# Patient Record
Sex: Female | Born: 1975 | Race: Black or African American | Hispanic: No | Marital: Single | Smoking: Never smoker
Health system: Southern US, Community
[De-identification: ages and names within clinical notes are randomized; demographics above are authoritative.]

## PROBLEM LIST (undated history)

## (undated) DIAGNOSIS — Z5189 Encounter for other specified aftercare: Secondary | ICD-10-CM

## (undated) DIAGNOSIS — D279 Benign neoplasm of unspecified ovary: Secondary | ICD-10-CM

## (undated) DIAGNOSIS — E785 Hyperlipidemia, unspecified: Secondary | ICD-10-CM

## (undated) DIAGNOSIS — R079 Chest pain, unspecified: Secondary | ICD-10-CM

## (undated) DIAGNOSIS — R0609 Other forms of dyspnea: Secondary | ICD-10-CM

## (undated) DIAGNOSIS — Z9119 Patient's noncompliance with other medical treatment and regimen: Secondary | ICD-10-CM

## (undated) DIAGNOSIS — Z91199 Patient's noncompliance with other medical treatment and regimen due to unspecified reason: Secondary | ICD-10-CM

## (undated) DIAGNOSIS — I10 Essential (primary) hypertension: Secondary | ICD-10-CM

## (undated) DIAGNOSIS — R0600 Dyspnea, unspecified: Secondary | ICD-10-CM

## (undated) DIAGNOSIS — E119 Type 2 diabetes mellitus without complications: Secondary | ICD-10-CM

## (undated) HISTORY — DX: Chest pain, unspecified: R07.9

## (undated) HISTORY — DX: Morbid (severe) obesity due to excess calories: E66.01

## (undated) HISTORY — PX: OTHER SURGICAL HISTORY: SHX169

## (undated) HISTORY — DX: Hyperlipidemia, unspecified: E78.5

## (undated) HISTORY — DX: Essential (primary) hypertension: I10

## (undated) HISTORY — DX: Patient's noncompliance with other medical treatment and regimen due to unspecified reason: Z91.199

## (undated) HISTORY — DX: Encounter for other specified aftercare: Z51.89

## (undated) HISTORY — DX: Dyspnea, unspecified: R06.00

## (undated) HISTORY — PX: MOUTH SURGERY: SHX715

## (undated) HISTORY — PX: LAPAROSCOPIC GASTRIC SLEEVE RESECTION: SHX5895

## (undated) HISTORY — DX: Benign neoplasm of unspecified ovary: D27.9

## (undated) HISTORY — DX: Patient's noncompliance with other medical treatment and regimen: Z91.19

## (undated) HISTORY — DX: Other forms of dyspnea: R06.09

## (undated) HISTORY — DX: Type 2 diabetes mellitus without complications: E11.9

## (undated) HISTORY — PX: ABDOMINAL HYSTERECTOMY: SHX81

---

## 2006-07-02 HISTORY — PX: OOPHORECTOMY: SHX86

## 2011-06-13 ENCOUNTER — Emergency Department: Payer: Self-pay | Admitting: Emergency Medicine

## 2012-05-15 ENCOUNTER — Other Ambulatory Visit (HOSPITAL_COMMUNITY)
Admission: RE | Admit: 2012-05-15 | Discharge: 2012-05-15 | Disposition: A | Discharge status: Home / Self Care | Payer: 59 | Source: Ambulatory Visit | Attending: Obstetrics and Gynecology | Admitting: Obstetrics and Gynecology

## 2012-05-15 DIAGNOSIS — Z01419 Encounter for gynecological examination (general) (routine) without abnormal findings: Secondary | ICD-10-CM | POA: Insufficient documentation

## 2012-05-15 DIAGNOSIS — Z1159 Encounter for screening for other viral diseases: Secondary | ICD-10-CM | POA: Insufficient documentation

## 2012-08-31 LAB — LIPID PANEL: Cholesterol: 158 mg/dL (ref 0–200)

## 2012-08-31 LAB — HEPATIC FUNCTION PANEL
ALT: 12 U/L (ref 7–35)
Alkaline Phosphatase: 50 U/L (ref 25–125)

## 2012-08-31 LAB — BASIC METABOLIC PANEL: Creatinine: 0.7 mg/dL (ref <=1.1)

## 2013-03-22 ENCOUNTER — Emergency Department: Payer: Self-pay | Admitting: Emergency Medicine

## 2013-03-22 LAB — CBC
MCHC: 32.5 g/dL (ref 32.0–36.0)
RBC: 4.12 10*6/uL (ref 3.80–5.20)
RDW: 14.9 % — ABNORMAL HIGH (ref 11.5–14.5)
WBC: 11.4 10*3/uL — ABNORMAL HIGH (ref 3.6–11.0)

## 2013-03-22 LAB — BASIC METABOLIC PANEL
Anion Gap: 7 (ref 7–16)
BUN: 13 mg/dL (ref 7–18)
Calcium, Total: 8.5 mg/dL (ref 8.5–10.1)
Chloride: 108 mmol/L — ABNORMAL HIGH (ref 98–107)
Co2: 22 mmol/L (ref 21–32)
Creatinine: 0.65 mg/dL (ref 0.60–1.30)
EGFR (African American): >60
EGFR (Non-African Amer.): >60
Glucose: 246 mg/dL — ABNORMAL HIGH (ref 65–99)
Osmolality: 282 (ref 275–301)
Potassium: 4 mmol/L (ref 3.5–5.1)
Sodium: 137 mmol/L (ref 136–145)

## 2013-03-22 LAB — PRO B NATRIURETIC PEPTIDE: B-Type Natriuretic Peptide: 81 pg/mL (ref 0–125)

## 2013-05-25 LAB — HEMOGLOBIN A1C: Hgb A1c MFr Bld: 12 % — AB (ref 4.0–6.0)

## 2013-06-10 ENCOUNTER — Encounter: Payer: Self-pay | Admitting: Internal Medicine

## 2013-06-10 DIAGNOSIS — E785 Hyperlipidemia, unspecified: Secondary | ICD-10-CM | POA: Insufficient documentation

## 2013-06-10 DIAGNOSIS — E1139 Type 2 diabetes mellitus with other diabetic ophthalmic complication: Secondary | ICD-10-CM | POA: Insufficient documentation

## 2013-06-10 DIAGNOSIS — E1165 Type 2 diabetes mellitus with hyperglycemia: Secondary | ICD-10-CM | POA: Insufficient documentation

## 2013-06-10 DIAGNOSIS — I10 Essential (primary) hypertension: Secondary | ICD-10-CM | POA: Insufficient documentation

## 2013-06-10 DIAGNOSIS — IMO0001 Reserved for inherently not codable concepts without codable children: Secondary | ICD-10-CM

## 2013-06-10 DIAGNOSIS — D279 Benign neoplasm of unspecified ovary: Secondary | ICD-10-CM | POA: Insufficient documentation

## 2013-06-17 ENCOUNTER — Ambulatory Visit: Payer: 59 | Admitting: Internal Medicine

## 2013-06-17 DIAGNOSIS — Z0289 Encounter for other administrative examinations: Secondary | ICD-10-CM

## 2013-07-07 LAB — HM DIABETES EYE EXAM

## 2013-07-15 ENCOUNTER — Encounter: Payer: Self-pay | Admitting: Internal Medicine

## 2013-07-15 ENCOUNTER — Ambulatory Visit (INDEPENDENT_AMBULATORY_CARE_PROVIDER_SITE_OTHER): Payer: 59 | Admitting: Internal Medicine

## 2013-07-15 VITALS — BP 118/78 | Temp 98.6°F | Resp 12 | Ht 66.0 in | Wt 294.0 lb

## 2013-07-15 DIAGNOSIS — IMO0001 Reserved for inherently not codable concepts without codable children: Secondary | ICD-10-CM

## 2013-07-15 MED ORDER — GLUCOSE BLOOD VI STRP: ORAL_STRIP | Status: DC

## 2013-07-15 MED ORDER — ACCU-CHEK SOFT TOUCH LANCETS MISC: Status: DC

## 2013-07-15 MED ORDER — CANAGLIFLOZIN 300 MG PO TABS: 300.0000 mg | ORAL_TABLET | Freq: Every day | ORAL | Status: DC

## 2013-07-15 NOTE — Progress Notes (Signed)
Patient ID: Brenda Acosta, female   DOB: 1976/07/25, 37 y.o.   MRN: [id number] HPI: Brenda Acosta is a 37 y.o.-year-old female, referred by her PCP, Dr. Ihor Dow, for management of DM2, non-insulin-dependent, uncontrolled, without complications.  Patient has been diagnosed with diabetes in [address]; she has not been on insulin before. Last hemoglobin A1c was: Lab Results  Component Value Date   HGBA1C 12.0* 05/25/2013   previously, hemoglobin A1c was 10.5 on 09/01/2012.  Pt is on a regimen of: - Metformin 1000 mg po bid - Glipizide 10 mg bid - could not afford Januvia 100 mg qd She does not want to take insulin, per discussion with PCP on 05/25/2013 and this was confirmed today. Her mother was dx with DM1 in her 30s, and she had to give her the injections. Mother had multiple problems with them >> cellulitis, other infections.   Pt does not check her sugars as she does not like to stick herself - types on computer 12h a day and cannot have sore fingertips. - average 200-250. No lows. Lowest sugar was 125 (fasting); ?  hypoglycemia awareness. Highest sugar was 375 6-7 mo ag.   Pt's meals are: - Breakfast: [place] bacon + eggs + cheese, occas. replacement meal (shake) - Lunch: replacement meal (shake) - Dinner: lean Cuisine, fish or chicken; veggies - Snacks: nuts and fruit Lost 30-40 lbs in last 3 years. Started [place] meal replacement plan - 2 shakes and a meal to lose weight. Tries to not eat starch. Walks her dog in am, no formal exercise.  - no CKD, last BUN/creatinine was 14/0.66, with a GFR of 122:  Lab Results  Component Value Date   BUN 14 08/31/2012   CREATININE 0.7 08/31/2012  She is on Lisinopril 20. Last ACR was undetectable on 09/01/2012.  - last set of lipids were: Lab Results  Component Value Date   CHOL 158 08/31/2012   HDL 31* 08/31/2012   LDLCALC 97 08/31/2012   TRIG 235* 08/31/2012  - last eye exam was in 07/08/2012. Mild DR >> goes back in 6 mo. - no numbness and  tingling in her feet. Last foot exam normal on 05/25/2013. She had Pneumovax administered on 08/31/2012. She declines flu vaccine then.  I reviewed records from her PCP and she also has a history of HTN, HL - on Pravastatin, obesity - was considering GBP, PCOS.  Pt has FH of DM in sister, mother, MGF.  ROS: Constitutional: fatigue, increased/decreased appetite, weight gain/loss, feeling hot, poor sleep Eyes: no blurry vision, no xerophthalmia ENT: no sore throat, no nodules palpated in throat, no dysphagia/odynophagia, no hoarseness Cardiovascular: no CP/SOB/palpitations/+ chronic significant leg swelling Respiratory: no cough/SOB Gastrointestinal: + N/no V/+ D/no C Musculoskeletal: no muscle/joint aches Skin: no rashes; + easy bruising, excessive hair growth Neurological: no tremors/numbness/tingling/dizziness, + headaches Psychiatric: no depression/anxiety Low libido  Past Medical History  Diagnosis Date  . Diabetes mellitus without complication   . Hypertension   . Hyperlipidemia   . Mucinous cystadenoma of ovary     Hx of    Past Surgical History  Procedure Laterality Date  . Oophorectomy  07/2006    Rt; large benign ovarian tumor  . Mouth surgery    . Abdominal hysterectomy      partial hyst   History   Social History  . Marital Status: Unknown    Spouse Name: N/A    Number of Children: 61 and 35 y/o   Occupational History  . Director of Donor Relations  [employer]   Social History Main Topics  . Smoking status: Never Smoker   . Smokeless tobacco: Not on file  . Alcohol Use: No  . Drug Use: No  . 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 aspirin 81 MG chewable tablet Chew 81 mg by mouth daily.      Marland Kitchen glipiZIDE (GLUCOTROL) 10 MG tablet Take 10 mg by mouth 2 (two) times daily before a meal.      . ibuprofen (ADVIL,MOTRIN) 600 MG tablet Take 600 mg by mouth every 6 (six) hours as needed for pain.      Marland Kitchen lisinopril-hydrochlorothiazide (PRINZIDE,ZESTORETIC) 20-25 MG per tablet Take 1 tablet by mouth daily.      . metFORMIN (GLUCOPHAGE) 1000 MG tablet Take 1,000 mg by mouth 2 (two) times daily with a meal.      . mometasone (NASONEX) 50 MCG/ACT nasal spray Place 2 sprays into the nose daily.      . pravastatin (PRAVACHOL) 40 MG tablet Take 40 mg by mouth daily.      . sitaGLIPtin (JANUVIA) 100 MG tablet Take 100 mg by mouth daily.      . tranexamic acid (LYSTEDA) 650 MG TABS Take 1,300 mg by mouth 3 (three) times daily. Up to 5 days with menses.       No current facility-administered medications on file prior to visit.   Allergies  Allergen Reactions  . Benzoyl Peroxide Other (See Comments)    Burns     Family History  Problem Relation Age of Onset  . Heart disease Mother 81    heart attack  . Hypertension Father   . Diabetes Father     Type II  . Cancer Maternal Grandfather     prostate cancer   PE: BP 118/78  Temp(Src) 98.6 F (37 C) (Oral)  Resp 12  Ht 5\' 6"  (1.676 m)  Wt 294 lb (133.358 kg)  BMI 47.48 kg/m2  LMP 07/01/2013 Wt Readings from Last 3 Encounters:  07/15/13 294 lb (133.358 kg)   Constitutional: obese, in NAD Eyes: PERRLA, EOMI, no exophthalmos ENT: moist mucous membranes, no thyromegaly, no cervical lymphadenopathy Cardiovascular: RRR, No MRG, + nonpitting bilateral LE edema Respiratory: CTA B Gastrointestinal: abdomen soft, NT, ND, BS+ Musculoskeletal: no deformities, strength intact in all 4 Skin: moist, warm, acanthosis nigricans Neurological: no tremor with outstretched hands, DTR normal in all 4  ASSESSMENT: 1. DM2, non-insulin-dependent,  uncontrolled, without complications  PLAN:  1. Patient with long-standing, recently more uncontrolled diabetes, on oral antidiabetic regimen, which became insufficient - We discussed about options for treatment, and I suggested to:  Start Invokana at 100 mg daily x 1 week, then increase to 300 mg daily. - given discount card for Invokana - we discussed about SEs of Invokana, which are: dizziness (advised to be careful when stands from sitting position), decreased BP - usually not < normal (BP today is not low), and fungal UTIs (advised to let me know if develops one).  - we discussed at length about diet and I suggested changes (also see pt instructions). She refuses a  nutrition referral, had this in the past. - Strongly advised her to start checking her sugars at different times of the day - check 1-2 times a day, rotating checks - given sugar log and advised how to fill it and to bring it at next appt  - given foot care handout and explained the principles  - given instructions for hypoglycemia management "15-15 rule"  - has yearly eye exams - Return to clinic in 1.5 month with sugar log

## 2013-07-15 NOTE — Patient Instructions (Signed)
Start Invokana 100 mg daily in am for a week, then increase to 300 mg daily. Keep on Metformin and Glipizide. Return in 1.5 months with your sugar log.  Please consider the following ways to cut down carbs and fat and increase fiber and micronutrients in your diet:  - substitute whole grain for white bread or pasta - substitute brown rice for white rice - substitute 90-calorie flat bread pieces for slices of bread when possible - substitute sweet potatoes or yams for white potatoes - substitute humus for margarine - substitute tofu for cheese when possible - substitute almond or rice milk for regular milk (would not drink soy milk daily due to concern for soy estrogen influence on breast cancer risk) - substitute dark chocolate for other sweets when possible - substitute water - can add lemon or orange slices for taste - for diet sodas (artificial sweeteners will trick your body that you can eat sweets without getting calories and will lead you to overeating and weight gain in the long run) - do not skip breakfast or other meals (this will slow down the metabolism and will result in more weight gain over time)  - can try smoothies made from fruit and almond/rice milk in am instead of regular breakfast - can also try old-fashioned (not instant) oatmeal made with almond/rice milk in am - order the dressing on the side when eating salad at a restaurant (pour less than half of the dressing on the salad) - eat as little meat as possible - can try juicing, but should not forget that juicing will get rid of the fiber, so would alternate with eating raw veg./fruits or drinking smoothies - use as little oil as possible, even when using olive oil - can dress a salad with a mix of balsamic vinegar and lemon juice, for e.g. - use agave nectar, stevia sugar, or regular sugar rather than artificial sweateners - steam or broil/roast veggies  - snack on veggies/fruit/nuts (unsalted, preferably) when  possible, rather than processed foods - reduce or eliminate aspartame in diet (it is in diet sodas, chewing gum, etc) Read the labels!  Try to read Dr. Katherina Right book: "Program for Reversing Diabetes" for the vegan concept and other ideas for healthy eating.

## 2013-07-18 ENCOUNTER — Other Ambulatory Visit (INDEPENDENT_AMBULATORY_CARE_PROVIDER_SITE_OTHER): Payer: Self-pay

## 2013-07-20 ENCOUNTER — Other Ambulatory Visit: Payer: Self-pay | Admitting: *Deleted

## 2013-07-20 MED ORDER — GLUCOSE BLOOD VI STRP: ORAL_STRIP | Status: DC

## 2013-08-24 ENCOUNTER — Ambulatory Visit: Payer: 59 | Admitting: Internal Medicine

## 2013-08-26 ENCOUNTER — Ambulatory Visit: Payer: 59 | Admitting: Internal Medicine

## 2013-10-07 ENCOUNTER — Other Ambulatory Visit: Payer: Self-pay

## 2013-10-26 ENCOUNTER — Other Ambulatory Visit: Payer: Self-pay | Admitting: Internal Medicine

## 2013-11-22 ENCOUNTER — Other Ambulatory Visit: Payer: Self-pay | Admitting: Internal Medicine

## 2013-12-28 ENCOUNTER — Other Ambulatory Visit: Payer: Self-pay | Admitting: Internal Medicine

## 2014-02-03 ENCOUNTER — Other Ambulatory Visit: Payer: Self-pay | Admitting: Internal Medicine

## 2014-03-05 ENCOUNTER — Other Ambulatory Visit: Payer: Self-pay | Admitting: Internal Medicine

## 2014-04-04 ENCOUNTER — Other Ambulatory Visit: Payer: Self-pay | Admitting: Internal Medicine

## 2014-05-06 ENCOUNTER — Other Ambulatory Visit: Payer: Self-pay | Admitting: Internal Medicine

## 2014-05-07 ENCOUNTER — Other Ambulatory Visit: Payer: Self-pay | Admitting: Internal Medicine

## 2014-05-08 NOTE — Telephone Encounter (Signed)
Needs appt for further refills.

## 2014-06-13 ENCOUNTER — Encounter: Payer: Self-pay | Admitting: Internal Medicine

## 2014-06-13 ENCOUNTER — Other Ambulatory Visit: Payer: Self-pay | Admitting: Internal Medicine

## 2014-06-14 ENCOUNTER — Other Ambulatory Visit: Payer: Self-pay | Admitting: *Deleted

## 2014-06-14 MED ORDER — CANAGLIFLOZIN 300 MG PO TABS: 1.0000 | ORAL_TABLET | Freq: Every day | ORAL | Status: DC

## 2014-06-29 ENCOUNTER — Telehealth: Payer: Self-pay

## 2014-06-29 NOTE — Telephone Encounter (Signed)
Diabetic Bundle. Pt advised to call back and schedule appointment with Dr. Cruzita Lederer.

## 2014-07-11 ENCOUNTER — Ambulatory Visit (INDEPENDENT_AMBULATORY_CARE_PROVIDER_SITE_OTHER): Payer: 59 | Admitting: Internal Medicine

## 2014-07-11 ENCOUNTER — Encounter: Payer: Self-pay | Admitting: Internal Medicine

## 2014-07-11 VITALS — BP 120/84 | HR 89 | Temp 98.3°F | Resp 12 | Wt 277.0 lb

## 2014-07-11 DIAGNOSIS — E1165 Type 2 diabetes mellitus with hyperglycemia: Principal | ICD-10-CM

## 2014-07-11 DIAGNOSIS — IMO0001 Reserved for inherently not codable concepts without codable children: Secondary | ICD-10-CM

## 2014-07-11 LAB — BASIC METABOLIC PANEL
BUN: 20 mg/dL (ref 6–23)
CALCIUM: 9.8 mg/dL (ref 8.4–10.5)
CO2: 22 meq/L (ref 19–32)
Chloride: 101 mEq/L (ref 96–112)
Creatinine, Ser: 0.8 mg/dL (ref 0.4–1.2)
GFR: 84.03 mL/min (ref >60.00)
Glucose, Bld: 135 mg/dL — ABNORMAL HIGH (ref 70–99)
POTASSIUM: 3.8 meq/L (ref 3.5–5.1)
SODIUM: 133 meq/L — AB (ref 135–145)

## 2014-07-11 LAB — HEMOGLOBIN A1C: Hgb A1c MFr Bld: 11.2 % — ABNORMAL HIGH (ref 4.6–6.5)

## 2014-07-11 MED ORDER — CANAGLIFLOZIN 300 MG PO TABS: 1.0000 | ORAL_TABLET | Freq: Every day | ORAL | Status: DC

## 2014-07-11 NOTE — Patient Instructions (Signed)
Continue Invokana 300 mg daily in am  Continue Metformin 1000 mg 2x a day  Continue Glipizide 10 mg 2x a day  Please stop at the lab.  Try to check sugars 1-2x every day and ring log at next visit.

## 2014-07-11 NOTE — Progress Notes (Signed)
Patient ID: Brenda Acosta, female   DOB: [birth year], 38 y.o.   MRN: [id number] HPI: Brenda Acosta is a 38 y.o.-year-old female, returning for f/u for DM2, dx 2012, non-insulin-dependent, uncontrolled, with complications (mild DR). Last visit was 1 year ago!  Last hemoglobin A1c was: Lab Results  Component Value Date   HGBA1C 12.0* 05/25/2013   previously, hemoglobin A1c was 10.5 on 09/01/2012.  Pt is on a regimen of: - Metformin 1000 mg po bid - Glipizide 10 mg bid - Invokana 300 mg daily She could not afford Januvia 100 mg qd She does not want to take insulin, per discussion with PCP on 05/25/2013 and this was confirmed today. Her mother was dx with DM1 in her 45s, and she had to give her the injections. Mother had multiple problems with them >> cellulitis, other infections.   Pt does not check her sugars as she does not like to stick herself - types on computer 12h a day and cannot have sore fingertips. - average 200-250 >> n/c ? lows. ?  hypoglycemia awareness.  Pt's meals are: - Breakfast: [place] bacon + eggs + cheese, occas. replacement meal (shake) - Lunch: replacement meal (shake) - Dinner: lean Cuisine, fish or chicken; veggies - Snacks: nuts and fruit Lost 30-40 lbs in last 4 years and 20 lbs since last visit.   - no CKD, last BUN/creatinine was 14/0.66, with a GFR of 122. Lab Results  Component Value Date   BUN 14 08/31/2012   CREATININE 0.7 08/31/2012  She is on Lisinopril 20. Last ACR was undetectable on 09/01/2012.  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 - no numbness and tingling in her feet. Foot exam normal on 11/2013. She had Pneumovax administered on 08/31/2012.  She also has a history of HTN, HL - on Pravastatin, obesity - was considering GBP,  PCOS.  I reviewed pt's medications, allergies, PMH, social hx, family hx and no changes required, except as mentioned above.  ROS: Constitutional: no fatigue, no increased/decreased appetite, + weight loss Eyes: no blurry vision, no xerophthalmia ENT: no sore throat, no nodules palpated in throat, no dysphagia/odynophagia, no hoarseness Cardiovascular: no CP/SOB/palpitations/+ significant leg swelling Respiratory: no cough/SOB Gastrointestinal: no N/no V/no D/no C Musculoskeletal: no muscle/+ joint aches (rotator cuff tendonitis) Skin: no rashes; no easy bruising Neurological: no tremors/numbness/tingling/dizziness, + headaches  PE: BP 120/84  Pulse 89  Temp(Src) 98.3 F (36.8 C) (Oral)  Resp 12  Wt 277 lb (125.646 kg)  SpO2 98% Wt Readings from Last 3 Encounters:  07/11/14 277 lb (125.646 kg)  07/15/13 294 lb (133.358 kg)   Constitutional: obese, in NAD Eyes: PERRLA, EOMI, no exophthalmos ENT: moist mucous membranes, no thyromegaly, no cervical lymphadenopathy Cardiovascular: RRR, No MRG, + nonpitting bilateral LE edema Respiratory: CTA B Gastrointestinal: abdomen soft, NT, ND, BS+ Musculoskeletal: no deformities, strength intact in all 4 Skin: moist, warm, acanthosis nigricans Neurological: no tremor with outstretched hands, DTR normal in all 4  ASSESSMENT: 1. 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 Patient with long-standing, uncontrolled diabetes, on oral antidiabetic regimen, with unknown control (not checking sugars, no HbA1c in last year, not coming to the appts as advised...). - I suggested to Continue Invokana 300  mg daily in am Continue Metformin 1000 mg 2x a day Continue Glipizide 10 mg 2x a day - She is doing great on her diet but I wonder if hyperglycemia may play a role in her weight loss...  - start checking her sugars at different times of the day - check 1-2 times a day, rotating checks - given sugar logs -  has yearly eye exams - refilled Invokana  - check HbA1c - got records from PCP - Return to clinic in 3 months with sugar log   Office Visit on 07/11/2014  Component Date Value Ref Range Status  . HM Diabetic Eye Exam 07/07/2013 Retinopathy* No Retinopathy Final   mild nonproliferative DR  . Hemoglobin A1C 07/11/2014 11.2* 4.6 - 6.5 % Final   Glycemic Control Guidelines for People with Diabetes:Non Diabetic:  <6%Goal of Therapy: <7%Additional Action Suggested:  >8%   . Sodium 07/11/2014 133* 135 - 145 mEq/L Final  . Potassium 07/11/2014 3.8  3.5 - 5.1 mEq/L Final  . Chloride 07/11/2014 101  96 - 112 mEq/L Final  . CO2 07/11/2014 22  19 - 32 mEq/L Final  . Glucose, Bld 07/11/2014 135* 70 - 99 mg/dL Final  . BUN 07/11/2014 20  6 - 23 mg/dL Final  . Creatinine, Ser 07/11/2014 0.8  0.4 - 1.2 mg/dL Final  . Calcium 07/11/2014 9.8  8.4 - 10.5 mg/dL Final  . GFR 07/11/2014 84.03  >60.00 mL/min Final   DM out of control. She would not check sugars... Will need basal insulin as she is glucotoxic. Will ask her to come back in 1 mo and in the meantime start checking sugars 2x a day.

## 2014-10-11 ENCOUNTER — Ambulatory Visit: Payer: 59 | Admitting: Internal Medicine

## 2015-01-11 ENCOUNTER — Other Ambulatory Visit: Payer: Self-pay | Admitting: Internal Medicine

## 2015-02-03 ENCOUNTER — Emergency Department: Payer: Self-pay | Admitting: Emergency Medicine

## 2015-02-09 ENCOUNTER — Ambulatory Visit
Admission: RE | Admit: 2015-02-09 | Discharge: 2015-02-09 | Disposition: A | Discharge status: Home / Self Care | Payer: 59 | Source: Ambulatory Visit | Attending: Family Medicine | Admitting: Family Medicine

## 2015-02-09 ENCOUNTER — Other Ambulatory Visit: Payer: Self-pay | Admitting: Family Medicine

## 2015-03-13 ENCOUNTER — Other Ambulatory Visit: Payer: Self-pay | Admitting: Obstetrics and Gynecology

## 2015-03-13 ENCOUNTER — Other Ambulatory Visit (HOSPITAL_COMMUNITY)
Admission: RE | Admit: 2015-03-13 | Discharge: 2015-03-13 | Disposition: A | Discharge status: Home / Self Care | Payer: 59 | Source: Ambulatory Visit | Attending: Obstetrics and Gynecology | Admitting: Obstetrics and Gynecology

## 2015-03-13 DIAGNOSIS — Z1151 Encounter for screening for human papillomavirus (HPV): Secondary | ICD-10-CM | POA: Diagnosis present

## 2015-03-13 DIAGNOSIS — Z01419 Encounter for gynecological examination (general) (routine) without abnormal findings: Secondary | ICD-10-CM | POA: Insufficient documentation

## 2015-03-15 LAB — CYTOLOGY - PAP

## 2015-03-24 ENCOUNTER — Other Ambulatory Visit: Payer: Self-pay | Admitting: Obstetrics and Gynecology

## 2015-04-08 ENCOUNTER — Other Ambulatory Visit: Payer: Self-pay | Admitting: Internal Medicine

## 2015-04-09 NOTE — Telephone Encounter (Signed)
PT NEEDS APPT FOR FURTHER REFILLS 

## 2015-06-09 ENCOUNTER — Other Ambulatory Visit: Payer: Self-pay | Admitting: *Deleted

## 2015-06-09 MED ORDER — CANAGLIFLOZIN 300 MG PO TABS: 300.0000 mg | ORAL_TABLET | Freq: Every day | ORAL | Status: DC

## 2015-07-15 ENCOUNTER — Other Ambulatory Visit: Payer: Self-pay | Admitting: Internal Medicine

## 2015-08-01 ENCOUNTER — Emergency Department: Payer: 59

## 2015-08-01 ENCOUNTER — Encounter: Payer: Self-pay | Admitting: Emergency Medicine

## 2015-08-01 ENCOUNTER — Other Ambulatory Visit: Payer: Self-pay

## 2015-08-01 ENCOUNTER — Emergency Department
Admission: EM | Admit: 2015-08-01 | Discharge: 2015-08-02 | Disposition: A | Discharge status: Home / Self Care | Payer: 59 | Attending: Emergency Medicine | Admitting: Emergency Medicine

## 2015-08-01 DIAGNOSIS — Z7982 Long term (current) use of aspirin: Secondary | ICD-10-CM | POA: Diagnosis not present

## 2015-08-01 DIAGNOSIS — R600 Localized edema: Secondary | ICD-10-CM | POA: Insufficient documentation

## 2015-08-01 DIAGNOSIS — I10 Essential (primary) hypertension: Secondary | ICD-10-CM | POA: Diagnosis not present

## 2015-08-01 DIAGNOSIS — R0602 Shortness of breath: Secondary | ICD-10-CM

## 2015-08-01 DIAGNOSIS — R609 Edema, unspecified: Secondary | ICD-10-CM

## 2015-08-01 DIAGNOSIS — E119 Type 2 diabetes mellitus without complications: Secondary | ICD-10-CM | POA: Diagnosis not present

## 2015-08-01 DIAGNOSIS — Z79899 Other long term (current) drug therapy: Secondary | ICD-10-CM | POA: Insufficient documentation

## 2015-08-01 LAB — CBC WITH DIFFERENTIAL/PLATELET
Basophils Absolute: 0.1 10*3/uL (ref 0–0.1)
Basophils Relative: 1 %
EOS PCT: 3 %
Eosinophils Absolute: 0.4 10*3/uL (ref 0–0.7)
HEMATOCRIT: 32.6 % — AB (ref 35.0–47.0)
Hemoglobin: 11.1 g/dL — ABNORMAL LOW (ref 12.0–16.0)
LYMPHS PCT: 35 %
Lymphs Abs: 4.3 10*3/uL — ABNORMAL HIGH (ref 1.0–3.6)
MCH: 28 pg (ref 26.0–34.0)
MCHC: 34 g/dL (ref 32.0–36.0)
MCV: 82.1 fL (ref 80.0–100.0)
MONO ABS: 0.5 10*3/uL (ref 0.2–0.9)
MONOS PCT: 5 %
Neutro Abs: 6.9 10*3/uL — ABNORMAL HIGH (ref 1.4–6.5)
Neutrophils Relative %: 56 %
PLATELETS: 451 10*3/uL — AB (ref 150–440)
RBC: 3.97 MIL/uL (ref 3.80–5.20)
RDW: 13.3 % (ref 11.5–14.5)
WBC: 12.3 10*3/uL — ABNORMAL HIGH (ref 3.6–11.0)

## 2015-08-01 LAB — COMPREHENSIVE METABOLIC PANEL
ALBUMIN: 3.3 g/dL — AB (ref 3.5–5.0)
ALT: 14 U/L (ref 14–54)
ANION GAP: 8 (ref 5–15)
AST: 32 U/L (ref 15–41)
Alkaline Phosphatase: 40 U/L (ref 38–126)
BUN: 14 mg/dL (ref 6–20)
CO2: 25 mmol/L (ref 22–32)
Calcium: 8.9 mg/dL (ref 8.9–10.3)
Chloride: 105 mmol/L (ref 101–111)
Creatinine, Ser: 0.83 mg/dL (ref 0.44–1.00)
GFR calc Af Amer: >60 mL/min (ref >60)
GFR calc non Af Amer: >60 mL/min (ref >60)
GLUCOSE: 181 mg/dL — AB (ref 65–99)
POTASSIUM: 3.6 mmol/L (ref 3.5–5.1)
SODIUM: 138 mmol/L (ref 135–145)
Total Bilirubin: 0.3 mg/dL (ref 0.3–1.2)
Total Protein: 7 g/dL (ref 6.5–8.1)

## 2015-08-01 LAB — BRAIN NATRIURETIC PEPTIDE: B Natriuretic Peptide: 32 pg/mL (ref 0.0–100.0)

## 2015-08-01 LAB — TROPONIN I: Troponin I: <0.03 ng/mL (ref <0.031)

## 2015-08-01 MED ORDER — IOHEXOL 350 MG/ML SOLN
100.0000 mL | Freq: Once | INTRAVENOUS | Status: AC | PRN
  Administered 2015-08-01: 100 mL via INTRAVENOUS

## 2015-08-01 NOTE — ED Notes (Signed)
Pt resting quietly.  Pt alert.  No acute distress.

## 2015-08-01 NOTE — ED Notes (Signed)
Pt alert.  Resting quietly in room   Family with pt.  nsr on monitor.

## 2015-08-01 NOTE — ED Notes (Signed)
Reports going to MD office yesterday for legs swelling and mild sob.  States he MD told her to come to ER bc her d-dimer was elevated.  Skin w/d, no resp distress at this time.

## 2015-08-01 NOTE — ED Notes (Signed)
Resumed care from [facility] rn.  Pt alert.  nsr on monitor.  Skin warm and dry.  Iv in place.  Family with pt.

## 2015-08-01 NOTE — ED Provider Notes (Signed)
[facility] Emergency Department Provider Note  ____________________________________________  Time seen: Approximately 671 PM  I have reviewed the triage vital signs and the nursing notes.   HISTORY  Chief Complaint Shortness of Breath    HPI Brenda Acosta is a 39 y.o. female with a history of diabetes and hypertension who is presenting today with shortness of breath as well as bilateral lower extremity swelling over the past 2 weeks. The patient says that she has had 2 recent plane flights as well as started a estrogen containing birth control pills 2 months ago. She denies any history of blood clots in her family. She says that the swelling her legs has been going on for about 2 weeks with the left leg being greater than the right leg. She denies any smoking drinking or drug use. Says that she does have a history of congestive heart failure and her family. Denies any chest pain. Says that she has become shortness of breath as well over the past 2 weeks with exertion.Was seen her primary care doctor's office today who did a d-dimer which was found to be elevated. Was sent to the emergency department for further evaluation and treatment.   Past Medical History  Diagnosis Date  . Diabetes mellitus without complication   . Hypertension   . Hyperlipidemia   . Mucinous cystadenoma of ovary     Hx of     Patient Active Problem List   Diagnosis Date Noted  . Type II or unspecified type diabetes mellitus with ophthalmic manifestations, uncontrolled 06/10/2013  . Unspecified essential hypertension 06/10/2013  . Other and unspecified hyperlipidemia 06/10/2013  . Mucinous cystadenoma of ovary 06/10/2013    Past Surgical History  Procedure Laterality Date  . Oophorectomy  07/2006    Rt; large benign ovarian tumor  . Mouth surgery    . Abdominal hysterectomy      partial hyst    Current Outpatient Rx  Name  Route  Sig  Dispense  Refill  . aspirin 81 MG  chewable tablet   Oral   Chew 81 mg by mouth daily.         . canagliflozin (INVOKANA) 300 MG TABS tablet   Oral   Take 300 mg by mouth daily.   30 tablet   0     PT NEEDS APPT FOR FURTHER REFILLS.   Marland Kitchen glipiZIDE (GLUCOTROL) 10 MG tablet   Oral   Take 10 mg by mouth 2 (two) times daily before a meal.         . glucose blood test strip      Use as instructed - 2x a day   100 each   10     ACCU-CHEK AVIVA PLUS TEST STRIPS   . ibuprofen (ADVIL,MOTRIN) 600 MG tablet   Oral   Take 600 mg by mouth every 6 (six) hours as needed for pain.         . Lancets (ACCU-CHEK SOFT TOUCH) lancets      Use as instructed - 2x a day   100 each   12   . lisinopril-hydrochlorothiazide (PRINZIDE,ZESTORETIC) 20-25 MG per tablet   Oral   Take 1 tablet by mouth daily.         . metFORMIN (GLUCOPHAGE) 1000 MG tablet   Oral   Take 1,000 mg by mouth 2 (two) times daily with a meal.         . mometasone (NASONEX) 50 MCG/ACT nasal spray   Nasal  Place 2 sprays into the nose daily.         . pravastatin (PRAVACHOL) 40 MG tablet   Oral   Take 40 mg by mouth daily.         . sitaGLIPtin (JANUVIA) 100 MG tablet   Oral   Take 100 mg by mouth daily.         . tranexamic acid (LYSTEDA) 650 MG TABS   Oral   Take 1,300 mg by mouth 3 (three) times daily. Up to 5 days with menses.           Allergies Benzoyl peroxide  Family History  Problem Relation Age of Onset  . Heart disease Mother 89    heart attack  . Hypertension Father   . Diabetes Father     Type II  . Cancer Maternal Grandfather     prostate cancer    Social History Social History  Substance Use Topics  . Smoking status: Never Smoker   . Smokeless tobacco: None  . Alcohol Use: No    Review of Systems Constitutional: No fever/chills Eyes: No visual changes. ENT: No sore throat. Cardiovascular: Denies chest pain. Respiratory: As above  Gastrointestinal: No abdominal pain.  No nausea, no  vomiting.  No diarrhea.  No constipation. Genitourinary: Negative for dysuria. Musculoskeletal: Negative for back pain. Skin: Negative for rash. Neurological: Negative for headaches, focal weakness or numbness.  10-point ROS otherwise negative.  ____________________________________________   PHYSICAL EXAM:  VITAL SIGNS: ED Triage Vitals  Enc Vitals Group     BP 08/01/15 1753 116/74 mmHg     Pulse Rate 08/01/15 1753 87     Resp 08/01/15 1753 22     Temp 08/01/15 1753 99 F (37.2 C)     Temp Source 08/01/15 1753 Oral     SpO2 08/01/15 1753 99 %     Weight 08/01/15 1753 282 lb (127.914 kg)     Height 08/01/15 1753 5\' 6"  (1.676 m)     Head Cir --      Peak Flow --      Pain Score 08/01/15 1754 2     Pain Loc --      Pain Edu? --      Excl. in [place]? --     Constitutional: Alert and oriented. Well appearing and in no acute distress. Eyes: Conjunctivae are normal. PERRL. EOMI. Head: Atraumatic. Nose: No congestion/rhinnorhea. Mouth/Throat: Mucous membranes are moist.  Oropharynx non-erythematous. Neck: No stridor.   Cardiovascular: Normal rate, regular rhythm. Grossly normal heart sounds.  Good peripheral circulation. Respiratory: Normal respiratory effort.  No retractions. Lungs CTAB. Gastrointestinal: Soft and nontender. No distention. No abdominal bruits. No CVA tenderness. Musculoskeletal: Moderate edema to the bilateral lower extremities to the ankles and calves with the left lower extremity being greater than the right. There is an intact dorsalis pedis pulse that is equal to both feet.  No joint effusions. Neurologic:  Normal speech and language. No gross focal neurologic deficits are appreciated. No gait instability. Skin:  Skin is warm, dry and intact. No rash noted. Psychiatric: Mood and affect are normal. Speech and behavior are normal.  ____________________________________________   LABS (all labs ordered are listed, but only abnormal results are  displayed)  Labs Reviewed  CBC WITH DIFFERENTIAL/PLATELET - Abnormal; Notable for the following:    WBC 12.3 (*)    Hemoglobin 11.1 (*)    HCT 32.6 (*)    Platelets 451 (*)    Neutro Abs 6.9 (*)  Lymphs Abs 4.3 (*)    All other components within normal limits  COMPREHENSIVE METABOLIC PANEL - Abnormal; Notable for the following:    Glucose, Bld 181 (*)    Albumin 3.3 (*)    All other components within normal limits  BRAIN NATRIURETIC PEPTIDE  TROPONIN I   ____________________________________________  EKG  ED ECG REPORT I, Doran Stabler, the attending physician, personally viewed and interpreted this ECG.   Date: 08/01/2015  EKG Time: 1854  Rate: 80  Rhythm: normal EKG, normal sinus rhythm  Axis: Normal axis  Intervals:none  ST&T Change: No ST segment elevation or depression. No abnormal T-wave inversion.  ____________________________________________  RADIOLOGY  CT angiography negative for pulmonary embolus and without any significant abnormality. ____________________________________________   PROCEDURES    ____________________________________________   INITIAL IMPRESSION / ASSESSMENT AND PLAN / ED COURSE  Pertinent labs & imaging results that were available during my care of the patient were reviewed by me and considered in my medical decision making (see chart for details).  ----------------------------------------- 11:15 PM on 08/01/2015 -----------------------------------------  Patient resting comfortably at this time and is aware of the results of her labs as well as the CAT scan of the chest. We will proceed with bilateral lower summary ultrasound to rule out DVT. Signed out to Dr. Owens Shark. Patient knows that if the ultrasound of the bilateral lower extremities is found to not have a DVT then she will follow-up in the office with cardiology. Unclear etiology of symptoms. Reassuring labs as well as EKG and  CTA. ____________________________________________   FINAL CLINICAL IMPRESSION(S) / ED DIAGNOSES  Acute shortness of breath. Acute bilateral lower extremity edema. Initial visit.    Orbie Pyo, MD 08/01/15 [phone]

## 2015-08-02 ENCOUNTER — Telehealth: Payer: Self-pay

## 2015-08-02 NOTE — ED Provider Notes (Signed)
I assumed care of the patient 11:30 PM from Dr. Dineen Kid. Patient's bilateral lower extremity Dopplers revealed:EXAM: BILATERAL LOWER EXTREMITY VENOUS DOPPLER ULTRASOUND  TECHNIQUE: Gray-scale sonography with graded compression, as well as color Doppler and duplex ultrasound were performed to evaluate the lower extremity deep venous systems from the level of the common femoral vein and including the common femoral, femoral, profunda femoral, popliteal and calf veins including the posterior tibial, peroneal and gastrocnemius veins when visible. The superficial great saphenous vein was also interrogated. Spectral Doppler was utilized to evaluate flow at rest and with distal augmentation maneuvers in the common femoral, femoral and popliteal veins.  COMPARISON: None.  FINDINGS: RIGHT LOWER EXTREMITY  Common Femoral Vein: No evidence of thrombus. Normal compressibility, respiratory phasicity and response to augmentation.  Saphenofemoral Junction: No evidence of thrombus. Normal compressibility and flow on color Doppler imaging.  Profunda Femoral Vein: No evidence of thrombus. Normal compressibility and flow on color Doppler imaging.  Femoral Vein: No evidence of thrombus. Normal compressibility, respiratory phasicity and response to augmentation.  Popliteal Vein: No evidence of thrombus. Normal compressibility, respiratory phasicity and response to augmentation.  Calf Veins: No evidence of thrombus. Normal compressibility and flow on color Doppler imaging.  Superficial Great Saphenous Vein: No evidence of thrombus. Normal compressibility and flow on color Doppler imaging.  Venous Reflux: None.  Other Findings: None.  LEFT LOWER EXTREMITY  Common Femoral Vein: No evidence of thrombus. Normal compressibility, respiratory phasicity and response to augmentation.  Saphenofemoral Junction: No evidence of thrombus. Normal compressibility and flow on color Doppler  imaging.  Profunda Femoral Vein: No evidence of thrombus. Normal compressibility and flow on color Doppler imaging.  Femoral Vein: No evidence of thrombus. Normal compressibility, respiratory phasicity and response to augmentation.  Popliteal Vein: No evidence of thrombus. Normal compressibility, respiratory phasicity and response to augmentation.  Calf Veins: No evidence of thrombus. Normal compressibility and flow on color Doppler imaging. Left peroneal vein not well visualized due to body habitus.  Superficial Great Saphenous Vein: No evidence of thrombus. Normal compressibility and flow on color Doppler imaging.  Venous Reflux: None.  Other Findings: None.  IMPRESSION: No evidence of deep venous thrombosis.  I spoke with the patient at length regarding need for follow-up I also informed the patient of the possibility of visualizing DVTs below the knee and the percentage thereof. In addition I spoke with cardiologist on call who reviewed the patient's CT scan lab data and history and recommended outpatient follow-up. Patient agreed with discharge plan.  Gregor Hams, MD 08/02/15 [phone]

## 2015-08-02 NOTE — Telephone Encounter (Signed)
l mom to schedule ER f/u for dyspnea, NP

## 2015-08-02 NOTE — ED Notes (Signed)
md in with pt.  Iv dc'ed.  Family in with pt.  Pt in no acute distress.

## 2015-08-02 NOTE — Discharge Instructions (Signed)
Peripheral Edema °You have swelling in your legs (peripheral edema). This swelling is due to excess accumulation of salt and water in your body. Edema may be a sign of heart, kidney or liver disease, or a side effect of a medication. It may also be due to problems in the leg veins. Elevating your legs and using special support stockings may be very helpful, if the cause of the swelling is due to poor venous circulation. Avoid long periods of standing, whatever the cause. °Treatment of edema depends on identifying the cause. Chips, pretzels, pickles and other salty foods should be avoided. Restricting salt in your diet is almost always needed. Water pills (diuretics) are often used to remove the excess salt and water from your body via urine. These medicines prevent the kidney from reabsorbing sodium. This increases urine flow. °Diuretic treatment may also result in lowering of potassium levels in your body. Potassium supplements may be needed if you have to use diuretics daily. Daily weights can help you keep track of your progress in clearing your edema. You should call your caregiver for follow up care as recommended. °SEEK IMMEDIATE MEDICAL CARE IF:  °· You have increased swelling, pain, redness, or heat in your legs. °· You develop shortness of breath, especially when lying down. °· You develop chest or abdominal pain, weakness, or fainting. °· You have a fever. °Document Released: 12/26/2004 Document Revised: 02/10/2012 Document Reviewed: 12/06/2009 °ExitCare® Patient Information ©2015 [employer]. This information is not intended to replace advice given to you by your health care provider. Make sure you discuss any questions you have with your health care provider. ° °

## 2015-08-29 ENCOUNTER — Other Ambulatory Visit
Admission: RE | Admit: 2015-08-29 | Discharge: 2015-08-29 | Disposition: A | Discharge status: Home / Self Care | Payer: 59 | Source: Ambulatory Visit | Attending: Cardiovascular Disease | Admitting: Cardiovascular Disease

## 2015-08-29 ENCOUNTER — Encounter: Payer: Self-pay | Admitting: Cardiovascular Disease

## 2015-08-29 ENCOUNTER — Ambulatory Visit (INDEPENDENT_AMBULATORY_CARE_PROVIDER_SITE_OTHER): Payer: 59 | Admitting: Cardiovascular Disease

## 2015-08-29 VITALS — BP 100/62 | HR 94 | Ht 66.0 in | Wt 270.5 lb

## 2015-08-29 DIAGNOSIS — R0602 Shortness of breath: Secondary | ICD-10-CM | POA: Insufficient documentation

## 2015-08-29 DIAGNOSIS — R600 Localized edema: Secondary | ICD-10-CM | POA: Diagnosis not present

## 2015-08-29 DIAGNOSIS — R079 Chest pain, unspecified: Secondary | ICD-10-CM | POA: Diagnosis not present

## 2015-08-29 DIAGNOSIS — I10 Essential (primary) hypertension: Secondary | ICD-10-CM

## 2015-08-29 LAB — PROTEIN / CREATININE RATIO, URINE
CREATININE, URINE: 108 mg/dL
Total Protein, Urine: <6 mg/dL

## 2015-08-29 NOTE — Assessment & Plan Note (Signed)
This has been associated with about 20 pounds of weight gain. She is definitely at risk for congestive heart failure and cardiac studies have been ordered as outlined above. The other possibility is nephrotic syndrome given her low albumen and prolonged history of diabetes. Thus, I requested a urine protein to creatinine ratio. Continue current dose of furosemide for now.

## 2015-08-29 NOTE — Assessment & Plan Note (Signed)
Blood pressure is controlled

## 2015-08-29 NOTE — Progress Notes (Signed)
Primary care physician: Dr. Milagros Evener at [place] [facility]  HPI   This is a pleasant 39 year old African-American female who was referred for evaluation of leg edema, shortness of breath and chest pain. She has no previous cardiac history. She does have family history of coronary artery disease. Her mother died at the age of 46 of myocardial infarction. Her grandfather had cardiac transplant. She has chronic medical conditions that include type 2 diabetes which has been poorly controlled over the last 5 years, morbid obesity, hypertension and hyperlipidemia. She is not a smoker. Her highest weight was 330 pounds. She lost significant amount of weight with diet and exercise. Her weight in August was 260 pounds. She had 2 plane trips in August and when she returned she felt extremely dyspneic with substernal chest tightness and leg swelling. Evaluation in the emergency room revealed borderline elevated d-dimer. She had CT scan of the chest that showed no evidence of pulmonary embolism. Venous duplex showed no evidence of DVT. She was placed on small dose Lasix with no significant improvement. The dose was increased recently to 40 mg once daily. She is still above her previous weight. She continues to have dyspnea with minimal activities which frequently is associated with substernal chest tightness. Her labs from the emergency room were overall unremarkable except for mildly reduced albumen at 3.3. BNP was normal. She reports following a vegan and diet for about 6 months but recently she started introducing meet again.  Allergies  Allergen Reactions  . Benzoyl Peroxide Other (See Comments)    Burns      Current Outpatient Prescriptions on File Prior to Visit  Medication Sig Dispense Refill  . aspirin 81 MG chewable tablet Chew 81 mg by mouth daily.    Marland Kitchen glipiZIDE (GLUCOTROL) 10 MG tablet Take 10 mg by mouth 2 (two) times daily before a meal.    . glucose blood test strip Use as  instructed - 2x a day 100 each 10  . ibuprofen (ADVIL,MOTRIN) 600 MG tablet Take 600 mg by mouth every 6 (six) hours as needed for pain.    . Lancets (ACCU-CHEK SOFT TOUCH) lancets Use as instructed - 2x a day 100 each 12  . lisinopril-hydrochlorothiazide (PRINZIDE,ZESTORETIC) 20-25 MG per tablet Take 1 tablet by mouth daily.    . metFORMIN (GLUCOPHAGE) 1000 MG tablet Take 1,000 mg by mouth 2 (two) times daily with a meal.    . pravastatin (PRAVACHOL) 40 MG tablet Take 40 mg by mouth daily.     No current facility-administered medications on file prior to visit.     Past Medical History  Diagnosis Date  . Diabetes mellitus without complication   . Hypertension   . Hyperlipidemia   . Mucinous cystadenoma of ovary     Hx of      Past Surgical History  Procedure Laterality Date  . Oophorectomy  07/2006    Rt; large benign ovarian tumor  . Mouth surgery    . Abdominal hysterectomy      partial hyst     Family History  Problem Relation Age of Onset  . Heart disease Mother 47    heart attack  . Hypertension Father   . Diabetes Father     Type II  . Cancer Maternal Grandfather     prostate cancer     Social History   Social History  . Marital Status: Married    Spouse Name: N/A  . Number of Children: 0  .  Years of Education: N/A   Occupational History  .  [place] History Main Topics  . Smoking status: Never Smoker   . Smokeless tobacco: Not on file  . Alcohol Use: No  . Drug Use: No  . Sexual Activity:    Partners: Male   Other Topics Concern  . 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 Avg. Glucose: 298      Regular exercise: no   Caffeine use: occassionally                    ROS A 10 point review of system was performed. It is negative other  than that mentioned in the history of present illness.   PHYSICAL EXAM   BP 100/62 mmHg  Pulse 94  Ht 5\' 6"  (1.676 m)  Wt 270 lb 8 oz (122.698 kg)  BMI 43.68 kg/m2  LMP 07/17/2015 Constitutional: She is oriented to person, place, and time. She appears well-developed and well-nourished. No distress.  HENT: No nasal discharge.  Head: Normocephalic and atraumatic.  Eyes: Pupils are equal and round. No discharge.  Neck: Normal range of motion. Neck supple. No JVD present. No thyromegaly present.  Cardiovascular: Normal rate, regular rhythm, normal heart sounds. Exam reveals no gallop and no friction rub. No murmur heard.  Pulmonary/Chest: Effort normal and breath sounds normal. No stridor. No respiratory distress. She has no wheezes. She has no rales. She exhibits no tenderness.  Abdominal: Soft. Bowel sounds are normal. She exhibits no distension. There is no tenderness. There is no rebound and no guarding.  Musculoskeletal: Normal range of motion. She exhibits +2 edema and no tenderness.  Neurological: She is alert and oriented to person, place, and time. Coordination normal.  Skin: Skin is warm and dry. No rash noted. She is not diaphoretic. No erythema. No pallor.  Psychiatric: She has a normal mood and affect. Her behavior is normal. Judgment and thought content normal.     EKG: Normal sinus rhythm with no significant ST or T wave changes.   ASSESSMENT AND PLAN

## 2015-08-29 NOTE — Assessment & Plan Note (Signed)
In spite of her young age, her exertional chest tightness is highly concerning for angina especially with multiple uncontrolled risk factors including diabetes. Her hemoglobin A1c has been consistently above 10 for the last few years. Thus, she is at significant risk for ischemic heart disease. I requested a treadmill nuclear stress test for evaluation. Continue low-dose aspirin for now. She also has significant exertional dyspnea and thus I requested an echocardiogram to evaluate diastolic function and pulmonary pressure.

## 2015-08-29 NOTE — Patient Instructions (Addendum)
Medication Instructions:  Your physician recommends that you continue on your current medications as directed. Please refer to the Current Medication list given to you today.   Labwork: Your physician recommends that you have lab work: urine protein/creatnine ratio   Testing/Procedures: Your physician has requested that you have an echocardiogram. Echocardiography is a painless test that uses sound waves to create images of your heart. It provides your doctor with information about the size and shape of your heart and how well your heart's chambers and valves are working. This procedure takes approximately one hour. There are no restrictions for this procedure.  Your physician has requested that you have a treadmill myoview. For further information please visit [email or web address]. Please follow instruction sheet, as given.  [place]  Your caregiver has ordered a Stress Test with nuclear imaging. The purpose of this test is to evaluate the blood supply to your heart muscle. This procedure is referred to as a "Non-Invasive Stress Test." This is because other than having an IV started in your vein, nothing is inserted or "invades" your body. Cardiac stress tests are done to find areas of poor blood flow to the heart by determining the extent of coronary artery disease (CAD). Some patients exercise on a treadmill, which naturally increases the blood flow to your heart, while others who are  unable to walk on a treadmill due to physical limitations have a pharmacologic/chemical stress agent called Lexiscan . This medicine will mimic walking on a treadmill by temporarily increasing your coronary blood flow.   Please note: these test may take anywhere between 2-4 hours to complete  PLEASE REPORT TO [place] AT THE FIRST DESK WILL DIRECT YOU WHERE TO GO  Date of Procedure: Monday, Oct 3, 8:30 Arrival Time for Procedure: 8:00 Instructions regarding medication:    _xx___ : Hold diabetes medication morning of procedure. Hold metformin 24 hours before and 48 hours after test. Hold glipizide 24 hour before test.   ____:  Hold betablocker(s) night before procedure and morning of procedure  __xx__:  Hold other medications as follows:_lasix and lisinopril/HCTZ the morning of your test ________________________________________________________________________________________________________________________________________________________________________________________________________________________________________________________________________________________  PLEASE NOTIFY THE OFFICE AT LEAST 24 HOURS IN ADVANCE IF YOU ARE UNABLE TO KEEP YOUR APPOINTMENT.  [phone] AND  PLEASE NOTIFY NUCLEAR MEDICINE AT [facility] AT LEAST 24 HOURS IN ADVANCE IF YOU ARE UNABLE TO KEEP YOUR APPOINTMENT. [phone]  How to prepare for your Myoview test:  1. Do not eat or drink after midnight 2. No caffeine for 24 hours prior to test 3. No smoking 24 hours prior to test. 4. Your medication may be taken with water.  If your doctor stopped a medication because of this test, do not take that medication. 5. Ladies, please do not wear dresses.  Skirts or pants are appropriate. Please wear a short sleeve shirt. 6. No perfume, cologne or lotion. 7. Wear comfortable walking shoes. No heels!            Follow-Up: Your physician recommends that you schedule a follow-up appointment after myoview   Any Other Special Instructions Will Be Listed Below (If Applicable).

## 2015-09-01 ENCOUNTER — Telehealth: Payer: Self-pay

## 2015-09-01 NOTE — Telephone Encounter (Signed)
S/w pt regarding Monday lexi. Reviewed instructions including medications to hold. Pt verbalized understanding and states she has no questions.

## 2015-09-04 ENCOUNTER — Ambulatory Visit
Admission: RE | Admit: 2015-09-04 | Discharge: 2015-09-04 | Disposition: A | Discharge status: Home / Self Care | Payer: 59 | Source: Ambulatory Visit | Attending: Cardiovascular Disease | Admitting: Cardiovascular Disease

## 2015-09-04 DIAGNOSIS — R0602 Shortness of breath: Secondary | ICD-10-CM

## 2015-09-04 LAB — NM MYOCAR MULTI W/SPECT W/WALL MOTION / EF
CSEPED: 7 min
CSEPHR: 91 %
Estimated workload: 9 METS
LV dias vol: 98 mL
LV sys vol: 34 mL
NUC STRESS TID: 0.82
Peak HR: 166 {beats}/min
Rest HR: 82 {beats}/min
SDS: 0
SRS: 1
SSS: 0

## 2015-09-04 MED ORDER — TECHNETIUM TC 99M SESTAMIBI - CARDIOLITE
29.1400 | Freq: Once | INTRAVENOUS | Status: AC | PRN
  Administered 2015-09-04: 29.14 via INTRAVENOUS

## 2015-09-04 MED ORDER — TECHNETIUM TC 99M SESTAMIBI - CARDIOLITE
13.6400 | Freq: Once | INTRAVENOUS | Status: AC | PRN
  Administered 2015-09-04: 13.64 via INTRAVENOUS

## 2015-09-14 ENCOUNTER — Other Ambulatory Visit: Payer: Self-pay | Admitting: Cardiovascular Disease

## 2015-09-14 ENCOUNTER — Other Ambulatory Visit: Payer: Self-pay

## 2015-09-14 ENCOUNTER — Ambulatory Visit (INDEPENDENT_AMBULATORY_CARE_PROVIDER_SITE_OTHER): Payer: 59

## 2015-09-14 DIAGNOSIS — R600 Localized edema: Secondary | ICD-10-CM

## 2015-09-14 DIAGNOSIS — Z8249 Family history of ischemic heart disease and other diseases of the circulatory system: Secondary | ICD-10-CM

## 2015-09-14 DIAGNOSIS — R079 Chest pain, unspecified: Secondary | ICD-10-CM

## 2015-09-14 DIAGNOSIS — R609 Edema, unspecified: Secondary | ICD-10-CM

## 2015-09-14 DIAGNOSIS — R0602 Shortness of breath: Secondary | ICD-10-CM

## 2015-10-16 ENCOUNTER — Ambulatory Visit: Payer: 59 | Admitting: Cardiovascular Disease

## 2015-11-20 ENCOUNTER — Encounter: Payer: Self-pay | Admitting: Internal Medicine

## 2015-11-20 ENCOUNTER — Ambulatory Visit (INDEPENDENT_AMBULATORY_CARE_PROVIDER_SITE_OTHER): Payer: 59 | Admitting: Internal Medicine

## 2015-11-20 VITALS — BP 126/80 | HR 81 | Temp 98.6°F | Resp 20 | Wt 279.0 lb

## 2015-11-20 DIAGNOSIS — E119 Type 2 diabetes mellitus without complications: Secondary | ICD-10-CM | POA: Diagnosis not present

## 2015-11-20 LAB — POCT GLYCOSYLATED HEMOGLOBIN (HGB A1C): Hemoglobin A1C: 11.5

## 2015-11-20 MED ORDER — GLIPIZIDE 10 MG PO TABS: 10.0000 mg | ORAL_TABLET | Freq: Two times a day (BID) | ORAL | Status: DC

## 2015-11-20 MED ORDER — METFORMIN HCL 1000 MG PO TABS: 1000.0000 mg | ORAL_TABLET | Freq: Two times a day (BID) | ORAL | Status: DC

## 2015-11-20 MED ORDER — CANAGLIFLOZIN 300 MG PO TABS: 300.0000 mg | ORAL_TABLET | Freq: Every day | ORAL | Status: DC

## 2015-11-20 NOTE — Patient Instructions (Signed)
Please continue: - Metformin 1000 mg 2x a day with meals - Glipizide 10 mg 2x a day before meals  Start back: - Invokana 300 mg daily in am  Please establish care with another endocrinologist.

## 2015-11-20 NOTE — Progress Notes (Signed)
Patient ID: Brenda Acosta, female   DOB: [birth year], 39 y.o.   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 Last visit was 1.5 year ago! Previous visit was also 1 year after the prior.  SHE IS NOT COMPLIANT WITH APPTS, SUGARS CHECK AND RECOMMENDED REGIMEN! She comes for appts less frequently than once a year.  Last hemoglobin A1c was: Lab Results  Component Value Date   HGBA1C 11.2* 07/11/2014   previously, hemoglobin A1c was 10.5 on 09/01/2012.  Pt is on a regimen of: - Metformin 1000 mg po bid - Glipizide 10 mg bid Off  Invokana 300 mg daily since 06-06/2015 She could not afford Januvia 100 mg qd She does not want to take insulin, per discussion with PCP on 05/25/2013 and this was confirmed today. Her mother was dx with DM1 in her 51s, and she had to give her the injections. Mother had multiple problems with them >> cellulitis, other infections.   Pt does not check her sugars as she does not like to stick herself - types on computer 12h a day and cannot have sore fingertips. - average 200-250 >> n/c ? lows. ?  hypoglycemia awareness.  She tells me that since last visit, she was vegan for 6 months, and her he A1c decreased to 10%. Since then, she got back to eating meat.  - no CKD, last BUN/creatinine was 14/0.66, with a GFR of 122. Lab Results  Component Value Date   BUN 14 08/01/2015   CREATININE 0.83 08/01/2015  She is on Lisinopril 20. Last ACR was undetectable on 09/01/2012.  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 - no numbness and tingling in her feet. Foot exam normal on 11/2013. She had Pneumovax administered on 08/31/2012. She refuses flu shot as she got sick at one point 10  years ago  She also has a history of HTN, HL - on Pravastatin, obesity - was considering GBP, PCOS.  ROS: Constitutional: no fatigue, no increased/decreased appetite, + weight loss and weight gain Eyes: no blurry vision, no xerophthalmia ENT: no sore throat, no nodules palpated in throat, no dysphagia/odynophagia, no hoarseness Cardiovascular: no CP/SOB/palpitations/+ leg swelling Respiratory: no cough/SOB Gastrointestinal: no N/no V/no D/no C Musculoskeletal: no muscle/+ joint aches (rotator cuff tendonitis) Skin: no rashes; no easy bruising Neurological: no tremors/numbness/tingling/dizziness I reviewed pt's medications, allergies, PMH, social hx, family hx, and changes were documented in the history of present illness. Otherwise, unchanged from my initial visit note:   Past Medical History  Diagnosis Date  . Diabetes mellitus without complication ([place])   . Hypertension   . Hyperlipidemia   . Mucinous cystadenoma of ovary     Hx of    Past Surgical History  Procedure Laterality Date  . Oophorectomy  07/2006    Rt; large benign ovarian tumor  . Mouth surgery    . Abdominal hysterectomy      partial hyst   Social History   Social History  . Marital Status: Married    Spouse Name: N/A  . Number of Children: 0  . Years of Education: N/A   Occupational History  .  [place] History Main Topics  .  Smoking status: Never Smoker   . Smokeless tobacco: Not on file  . Alcohol Use: No  . Drug Use: No  . Sexual Activity:    Partners: Male   Other Topics Concern  . 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 aspirin 81 MG chewable tablet Chew 81 mg by mouth daily.    . furosemide (LASIX) 20 MG  tablet Take 40 mg by mouth daily.     Marland Kitchen glipiZIDE (GLUCOTROL) 10 MG tablet Take 10 mg by mouth 2 (two) times daily before a meal.    . glucose blood test strip Use as instructed - 2x a day 100 each 10  . ibuprofen (ADVIL,MOTRIN) 600 MG tablet Take 600 mg by mouth every 6 (six) hours as needed for pain.    Marland Kitchen KLOR-CON M10 10 MEQ tablet Take 10 mEq by mouth 2 (two) times daily.     . Lancets (ACCU-CHEK SOFT TOUCH) lancets Use as instructed - 2x a day 100 each 12  . lisinopril-hydrochlorothiazide (PRINZIDE,ZESTORETIC) 20-25 MG per tablet Take 1 tablet by mouth daily.    . metFORMIN (GLUCOPHAGE) 1000 MG tablet Take 1,000 mg by mouth 2 (two) times daily with a meal.    . pravastatin (PRAVACHOL) 40 MG tablet Take 40 mg by mouth daily.     No current facility-administered medications on file prior to visit.   Allergies  Allergen Reactions  . Benzoyl Peroxide Other (See Comments)    Burns    Family History  Problem Relation Age of Onset  . Heart disease Mother 38    heart attack  . Hypertension Father   . Diabetes Father     Type II  . Cancer Maternal Grandfather     prostate cancer   PE: BP 126/80 mmHg  Pulse 81  Temp(Src) 98.6 F (37 C)  Resp 20  Wt 279 lb (126.554 kg)  SpO2 95%  LMP 11/09/2015 Body mass index is 45.05 kg/(m^2). Wt Readings from Last 3 Encounters:  11/20/15 279 lb (126.554 kg)  08/29/15 270 lb 8 oz (122.698 kg)  08/01/15 282 lb (127.914 kg)   Constitutional: obese, in NAD Eyes: PERRLA, EOMI, no exophthalmos ENT: moist mucous membranes, no thyromegaly, no cervical lymphadenopathy Cardiovascular: RRR, No MRG Respiratory: CTA B Gastrointestinal: abdomen soft, NT, ND, BS+ Musculoskeletal: no deformities, strength intact in all 4 Skin: moist, warm, + acanthosis nigricans Neurological: no tremor with outstretched hands, DTR normal in all 4  ASSESSMENT: 1. 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 Patient with  long-standing, uncontrolled diabetes, on oral antidiabetic regimen, with poor control (not checking sugars, no HbA1c in last year, last was 11.2% a year and a half ago, not coming to the appts as advised...). At last visit, I had a long discussion with the patient advising her to start taking control of her diabetes to avoid long-term complications. She tells me that this was the reason why she did not come back because I was "fussing at her" at last visit. She tells me that I should be happy that she is making an effort to come and see me - she tells me that her last appointment was not scheduled as I  advised  probably because a scheduling error. However, looking in her chart, she did cancel this appointment herself. I discussed with her that I cannot see her anymore if she does not follow my advice in checking blood sugars, taking the medications as advised (refuses insulin, ran out of [facility] over the summer and did not call for refills), or coming for the appointments. She tells me that I did not give her a plan to better control her diabetes. I advised her any treatment plan should start with checking her sugars and coming to the appointments. I feel that she is projecting her frustrations with her diabetes on me, and there is obviously a barrier in communication between [place]. I suggested that she tries to establish care with another endocrinologist as soon as possible and start working on her diabetes care. - I suggested to: Patient Instructions  Please continue: - Metformin 1000 mg 2x a day with meals - Glipizide 10 mg 2x a day before meals  Start back: - Invokana 300 mg daily in am  Please establish care with another endocrinologist.  - has yearly eye exams - last was somewhat 2016 - refilled Invokana, glipizide, metformin - check HbA1c >> 11.5% (terrible)

## 2015-11-24 ENCOUNTER — Other Ambulatory Visit: Payer: Self-pay | Admitting: *Deleted

## 2015-11-24 ENCOUNTER — Encounter: Payer: Self-pay | Admitting: Internal Medicine

## 2015-11-24 MED ORDER — FLUCONAZOLE 150 MG PO TABS: 150.0000 mg | ORAL_TABLET | Freq: Once | ORAL | Status: DC

## 2015-11-28 ENCOUNTER — Encounter: Payer: Self-pay | Admitting: Nurse Practitioner

## 2015-11-28 ENCOUNTER — Ambulatory Visit (INDEPENDENT_AMBULATORY_CARE_PROVIDER_SITE_OTHER): Payer: 59 | Admitting: Nurse Practitioner

## 2015-11-28 VITALS — BP 122/78 | HR 91 | Ht 66.0 in | Wt 267.0 lb

## 2015-11-28 DIAGNOSIS — I10 Essential (primary) hypertension: Secondary | ICD-10-CM

## 2015-11-28 DIAGNOSIS — R079 Chest pain, unspecified: Secondary | ICD-10-CM | POA: Insufficient documentation

## 2015-11-28 DIAGNOSIS — R0609 Other forms of dyspnea: Secondary | ICD-10-CM

## 2015-11-28 DIAGNOSIS — E785 Hyperlipidemia, unspecified: Secondary | ICD-10-CM

## 2015-11-28 DIAGNOSIS — R072 Precordial pain: Secondary | ICD-10-CM

## 2015-11-28 DIAGNOSIS — E119 Type 2 diabetes mellitus without complications: Secondary | ICD-10-CM | POA: Insufficient documentation

## 2015-11-28 DIAGNOSIS — Z9119 Patient's noncompliance with other medical treatment and regimen: Secondary | ICD-10-CM

## 2015-11-28 DIAGNOSIS — Z91199 Patient's noncompliance with other medical treatment and regimen due to unspecified reason: Secondary | ICD-10-CM

## 2015-11-28 MED ORDER — FUROSEMIDE 20 MG PO TABS: 20.0000 mg | ORAL_TABLET | Freq: Every day | ORAL | Status: DC

## 2015-11-28 NOTE — Progress Notes (Signed)
Patient Name: Brenda Acosta Date of Encounter: 11/28/2015  Primary Care Provider:  Aretta Nip, MD Primary Cardiologist:  Jerilynn Mages. Fletcher Anon, MD   Chief Complaint  39 year old female with history of poorly controlled diabetes, chest pain, dyspnea, who presents for follow-up.  Past Medical History   Past Medical History  Diagnosis Date  . Diabetes mellitus without complication ([place])     a. 11/20/2015 A1c 11.5;  b.  noncompliant w/ diabetes meds, glucose checks, f/u, etc.  . Essential hypertension   . Hyperlipidemia   . Mucinous cystadenoma of ovary     Hx of   . Chest pain     a. 09/2015 Ex MV: EF >55%, no ischemia/infarct.  Marland Kitchen Dyspnea on exertion     a. 09/2015 Echo: Ef 60-65%, no rwma, mild AI, mildly dil LA.  Marland Kitchen Noncompliance   . Morbid obesity [facility])    Past Surgical History  Procedure Laterality Date  . Oophorectomy  07/2006    Rt; large benign ovarian tumor  . Mouth surgery    . Abdominal hysterectomy      partial hyst    Allergies  Allergies  Allergen Reactions  . Benzoyl Peroxide Other (See Comments)    [place]     HPI  39 year old female with the above complex problem list. She has a history of type 2 diabetes mellitus dating back to 2007. She has been intermittently noncompliant with medications and follow-up and does not routinely check her blood sugars at home. Over this past summer, she discontinued invokana therapy and subsequently began to experience lower extremity edema along with dyspnea on exertion and chest pain. She was seen in cardiology clinic in September and subsequently underwent echocardiography which showed normal LV function, and stress testing, which was low risk. Since then, she reports that she has been exercising 6 days per week and has not been experiencing any further chest pain or dyspnea. She continues to try and lose weight and is vegan. She recently followed up with endocrinology and it was noted that she came off of all of her  diabetic medications. Hemoglobin A1c was 11.5. She has been advised to obtain a new endocrinologist. She denies PND, orthopnea, palpitations, dizziness, syncope, edema, or early satiety.  Home Medications  Prior to Admission medications   Medication Sig Start Date End Date Taking? Authorizing Provider  aspirin 81 MG chewable tablet Chew 81 mg by mouth daily.   Yes Historical Provider, MD  canagliflozin (INVOKANA) 300 MG TABS tablet Take 300 mg by mouth daily before breakfast. 11/20/15  Yes Philemon Kingdom, MD  fluconazole (DIFLUCAN) 150 MG tablet Take 1 tablet (150 mg total) by mouth once. Patient taking differently: Take 150 mg by mouth as needed.  11/24/15  Yes Philemon Kingdom, MD  furosemide (LASIX) 20 MG tablet Take 20 mg by mouth daily.  08/16/15  Yes Historical Provider, MD  glipiZIDE (GLUCOTROL) 10 MG tablet Take 1 tablet (10 mg total) by mouth 2 (two) times daily before a meal. 11/20/15  Yes Philemon Kingdom, MD  glucose blood test strip Use as instructed - 2x a day 07/20/13  Yes Philemon Kingdom, MD  ibuprofen (ADVIL,MOTRIN) 600 MG tablet Take 600 mg by mouth every 6 (six) hours as needed for pain.   Yes Historical Provider, MD  JUNEL FE 1/20 1-20 MG-MCG tablet Take 1 tablet by mouth daily.  11/07/15  Yes Historical Provider, MD  KLOR-CON M10 10 MEQ tablet Take 10 mEq by mouth daily.  08/16/15  Yes Historical  Provider, MD  Lancets (ACCU-CHEK SOFT TOUCH) lancets Use as instructed - 2x a day 07/15/13  Yes Philemon Kingdom, MD  lisinopril-hydrochlorothiazide (PRINZIDE,ZESTORETIC) 20-25 MG per tablet Take 1 tablet by mouth daily.   Yes Historical Provider, MD  metFORMIN (GLUCOPHAGE) 1000 MG tablet Take 1 tablet (1,000 mg total) by mouth 2 (two) times daily with a meal. 11/20/15  Yes Philemon Kingdom, MD  pravastatin (PRAVACHOL) 40 MG tablet Take 40 mg by mouth daily.   Yes Historical Provider, MD    Review of Systems  As above, she's been doing exceptionally well since the early fall.  She  denies chest pain, palpitations, dyspnea, pnd, orthopnea, n, v, dizziness, syncope, edema, weight gain, or early satiety.  All other systems reviewed and are otherwise negative except as noted above.  Physical Exam  VS:  BP 122/78 mmHg  Pulse 91  Ht 5\' 6"  (1.676 m)  Wt 267 lb (121.11 kg)  BMI 43.12 kg/m2  LMP 11/09/2015 , BMI Body mass index is 43.12 kg/(m^2). GEN: Well nourished, well developed, in no acute distress. HEENT: normal. Neck: Supple, no carotid bruits, or masses. Obese, difficult to gauge JVP.  Cardiac: RRR,2/6 in significant systolic murmur at the right upper sternal border, no rubs, or gallops. No clubbing, cyanosis, edema.  Radials/DP/PT 2+ and equal bilaterally.  Respiratory:  Respirations regular and unlabored, clear to auscultation bilaterally. GI: Soft, nontender, nondistended, BS + x 4. MS: no deformity or atrophy. Skin: warm and dry, no rash. Neuro:  Strength and sensation are intact. Psych: Normal affect.  Accessory Clinical Findings  Lab Results  Component Value Date   WBC 12.3* 08/01/2015   HGB 11.1* 08/01/2015   HCT 32.6* 08/01/2015   MCV 82.1 08/01/2015   PLT 451* 08/01/2015   Lab Results  Component Value Date   CREATININE 0.83 08/01/2015   BUN 14 08/01/2015   NA 138 08/01/2015   K 3.6 08/01/2015   CL 105 08/01/2015   CO2 25 08/01/2015   Lab Results  Component Value Date   HGBA1C 11.5 11/20/2015   Assessment & Plan  1.  Chest pain and dyspnea: Upon last clinic evaluation, patient complained of intermittent chest pain and dyspnea. Given risk factors of hypertension, obesity, and diabetes, she underwent echocardiography and stress testing. Both of which returned normal. She has done remarkably better since her last clinic visit and is now exercising 6 days a week without significant limitations. She is encouraged to continue lifestyle and diet modification, along with exercise. No further cardiac evaluation is necessary at this time.  2.  Essential hypertension: Stable on current regimen. She is on lisinopril HCTZ and has also been prescribed Lasix in the past for lower extremity edema. She can take Lasix when necessary or discontinue altogether.  3. Hyperlipidemia: This is followed by primary care and she is on statin therapy.  4. Poorly controlled type 2 diabetes mellitus: Complicated by noncompliance. She was recently seen by endocrinology and it was noted that she was neither checking her blood sugars nor taking her medications. Invokana was resumed and she says that she's been taking it.  She plans to f/u with primary care for future DM needs.  5.  Morbid obesity:  She is exercising 6 days/wk and wishes to get down to 200 lbs.  She is adhering to a vegan diet.  Continued lifestyle modification encouraged.  6. Noncompliance:  We discussed the importance of medication compliance and she expressed understanding.  7.  Disposition:  F/u PRN.  Murray Hodgkins, NP 11/28/2015, 8:27 AM

## 2015-11-28 NOTE — Patient Instructions (Signed)
Medication Instructions:  Your physician recommends that you continue on your current medications as directed. Please refer to the Current Medication list given to you today.   Labwork: none  Testing/Procedures: none  Follow-Up: Your physician recommends that you schedule a follow-up appointment as needed.    Any Other Special Instructions Will Be Listed Below (If Applicable).     If you need a refill on your cardiac medications before your next appointment, please call your pharmacy.   

## 2016-02-08 ENCOUNTER — Encounter: Payer: Self-pay | Admitting: Internal Medicine

## 2016-02-08 ENCOUNTER — Other Ambulatory Visit: Payer: Self-pay | Admitting: *Deleted

## 2016-02-08 MED ORDER — GLUCOSE BLOOD VI STRP: ORAL_STRIP | Status: DC

## 2016-02-08 MED ORDER — BAYER CONTOUR NEXT MONITOR W/DEVICE KIT: PACK | Status: DC

## 2016-02-08 MED ORDER — BAYER MICROLET LANCETS MISC: Status: DC

## 2016-02-08 NOTE — Telephone Encounter (Signed)
Received fax from [employer]. Ins no longer covers Accu-chek testing supplies. [employer] Next is preferred.

## 2016-02-12 ENCOUNTER — Other Ambulatory Visit: Payer: Self-pay | Admitting: *Deleted

## 2016-02-12 MED ORDER — BAYER MICROLET LANCETS MISC: Status: DC

## 2016-02-12 MED ORDER — GLUCOSE BLOOD VI STRP: ORAL_STRIP | Status: DC

## 2016-02-12 NOTE — Telephone Encounter (Signed)
Ins does not cover Accu-chek products; switching to [employer] products.

## 2016-03-12 DIAGNOSIS — Z01411 Encounter for gynecological examination (general) (routine) with abnormal findings: Secondary | ICD-10-CM | POA: Diagnosis not present

## 2016-05-17 ENCOUNTER — Other Ambulatory Visit: Payer: Self-pay | Admitting: Internal Medicine

## 2016-05-22 ENCOUNTER — Encounter: Payer: Self-pay | Admitting: Internal Medicine

## 2016-05-22 ENCOUNTER — Other Ambulatory Visit: Payer: Self-pay | Admitting: Internal Medicine

## 2016-05-22 MED ORDER — CANAGLIFLOZIN 300 MG PO TABS: 300.0000 mg | ORAL_TABLET | Freq: Every day | ORAL | Status: DC

## 2016-08-19 ENCOUNTER — Other Ambulatory Visit: Payer: Self-pay | Admitting: Internal Medicine

## 2016-08-20 NOTE — Telephone Encounter (Signed)
No office visit since 12/17 and no future apt scheduled should I refill? Please advise

## 2016-08-21 NOTE — Telephone Encounter (Signed)
Forwarded

## 2016-09-17 ENCOUNTER — Telehealth: Payer: Self-pay

## 2016-09-17 ENCOUNTER — Other Ambulatory Visit: Payer: Self-pay | Admitting: Internal Medicine

## 2016-09-17 NOTE — Telephone Encounter (Signed)
Patient called and states they are still trying to find another endocrinologist, they are asking for refill on invokana. Okay to refill until patient finds another Doctor? Please advise. Thank you!

## 2016-09-17 NOTE — Telephone Encounter (Signed)
Okay for another 3 months with one refill.

## 2016-09-18 ENCOUNTER — Other Ambulatory Visit: Payer: Self-pay

## 2016-09-18 MED ORDER — CANAGLIFLOZIN 300 MG PO TABS: 300.0000 mg | ORAL_TABLET | Freq: Every day | ORAL | 1 refills | Status: DC

## 2016-10-07 ENCOUNTER — Other Ambulatory Visit: Payer: Self-pay | Admitting: Nurse Practitioner

## 2016-10-18 NOTE — Telephone Encounter (Signed)
Pt established care with another endocrinologist, as I suggested at last visit.

## 2016-10-28 ENCOUNTER — Other Ambulatory Visit: Payer: Self-pay | Admitting: Family Medicine

## 2016-10-28 DIAGNOSIS — Z0000 Encounter for general adult medical examination without abnormal findings: Secondary | ICD-10-CM | POA: Diagnosis not present

## 2016-10-28 DIAGNOSIS — E282 Polycystic ovarian syndrome: Secondary | ICD-10-CM | POA: Diagnosis not present

## 2016-10-28 DIAGNOSIS — E1165 Type 2 diabetes mellitus with hyperglycemia: Secondary | ICD-10-CM | POA: Diagnosis not present

## 2016-10-28 DIAGNOSIS — Z1231 Encounter for screening mammogram for malignant neoplasm of breast: Secondary | ICD-10-CM

## 2016-10-28 DIAGNOSIS — Z862 Personal history of diseases of the blood and blood-forming organs and certain disorders involving the immune mechanism: Secondary | ICD-10-CM | POA: Diagnosis not present

## 2016-10-30 DIAGNOSIS — E1165 Type 2 diabetes mellitus with hyperglycemia: Secondary | ICD-10-CM | POA: Diagnosis not present

## 2016-10-30 DIAGNOSIS — Z0000 Encounter for general adult medical examination without abnormal findings: Secondary | ICD-10-CM | POA: Diagnosis not present

## 2016-10-30 DIAGNOSIS — Z862 Personal history of diseases of the blood and blood-forming organs and certain disorders involving the immune mechanism: Secondary | ICD-10-CM | POA: Diagnosis not present

## 2016-10-30 DIAGNOSIS — Z113 Encounter for screening for infections with a predominantly sexual mode of transmission: Secondary | ICD-10-CM | POA: Diagnosis not present

## 2016-12-04 ENCOUNTER — Ambulatory Visit
Admission: RE | Admit: 2016-12-04 | Discharge: 2016-12-04 | Disposition: A | Discharge status: Home / Self Care | Payer: BLUE CROSS/BLUE SHIELD | Source: Ambulatory Visit | Attending: Family Medicine | Admitting: Family Medicine

## 2016-12-04 DIAGNOSIS — Z1231 Encounter for screening mammogram for malignant neoplasm of breast: Secondary | ICD-10-CM | POA: Diagnosis not present

## 2017-02-03 ENCOUNTER — Other Ambulatory Visit: Payer: Self-pay | Admitting: Nurse Practitioner

## 2017-02-13 ENCOUNTER — Other Ambulatory Visit: Payer: Self-pay | Admitting: Internal Medicine

## 2017-02-13 NOTE — Telephone Encounter (Signed)
Okay to refill? Last seen 11/20/15. No new appointments. Thank you!

## 2017-02-18 ENCOUNTER — Other Ambulatory Visit: Payer: Self-pay

## 2017-02-26 NOTE — Telephone Encounter (Signed)
Please advise if okay to refill? Thank you! Last seen- 11/20/15 no other appointments.

## 2017-02-27 NOTE — Telephone Encounter (Signed)
Pateint notified of message via voicemail. Requested a call back if she had any further questions.

## 2017-02-27 NOTE — Telephone Encounter (Signed)
In June of last year she was trying to get established with another endocrinologist. They will need to refill her diabetes mellitus. She is lost for follow-up for me.

## 2017-03-17 ENCOUNTER — Other Ambulatory Visit: Payer: Self-pay | Admitting: Internal Medicine

## 2017-04-18 DIAGNOSIS — Z1329 Encounter for screening for other suspected endocrine disorder: Secondary | ICD-10-CM | POA: Diagnosis not present

## 2017-04-18 DIAGNOSIS — Z01411 Encounter for gynecological examination (general) (routine) with abnormal findings: Secondary | ICD-10-CM | POA: Diagnosis not present

## 2017-04-18 DIAGNOSIS — N898 Other specified noninflammatory disorders of vagina: Secondary | ICD-10-CM | POA: Diagnosis not present

## 2017-04-18 DIAGNOSIS — Z113 Encounter for screening for infections with a predominantly sexual mode of transmission: Secondary | ICD-10-CM | POA: Diagnosis not present

## 2017-05-03 ENCOUNTER — Emergency Department
Admission: EM | Admit: 2017-05-03 | Discharge: 2017-05-03 | Disposition: A | Discharge status: Home / Self Care | Payer: BLUE CROSS/BLUE SHIELD | Attending: Emergency Medicine | Admitting: Emergency Medicine

## 2017-05-03 DIAGNOSIS — K0889 Other specified disorders of teeth and supporting structures: Secondary | ICD-10-CM | POA: Diagnosis not present

## 2017-05-03 DIAGNOSIS — Z7984 Long term (current) use of oral hypoglycemic drugs: Secondary | ICD-10-CM | POA: Diagnosis not present

## 2017-05-03 DIAGNOSIS — Z7982 Long term (current) use of aspirin: Secondary | ICD-10-CM | POA: Diagnosis not present

## 2017-05-03 DIAGNOSIS — E119 Type 2 diabetes mellitus without complications: Secondary | ICD-10-CM | POA: Diagnosis not present

## 2017-05-03 DIAGNOSIS — I10 Essential (primary) hypertension: Secondary | ICD-10-CM | POA: Insufficient documentation

## 2017-05-03 DIAGNOSIS — Z79899 Other long term (current) drug therapy: Secondary | ICD-10-CM | POA: Insufficient documentation

## 2017-05-03 MED ORDER — TRAMADOL HCL 50 MG PO TABS: 50.0000 mg | ORAL_TABLET | Freq: Four times a day (QID) | ORAL | 0 refills | Status: AC | PRN

## 2017-05-03 MED ORDER — AMOXICILLIN 500 MG PO CAPS: 500.0000 mg | ORAL_CAPSULE | Freq: Three times a day (TID) | ORAL | 0 refills | Status: DC

## 2017-05-03 MED ORDER — LIDOCAINE VISCOUS 2 % MT SOLN
15.0000 mL | Freq: Once | OROMUCOSAL | Status: AC
  Administered 2017-05-03: 15 mL via OROMUCOSAL
  Filled 2017-05-03: qty 15

## 2017-05-03 MED ORDER — IBUPROFEN 600 MG PO TABS: 600.0000 mg | ORAL_TABLET | Freq: Three times a day (TID) | ORAL | 0 refills | Status: DC | PRN

## 2017-05-03 MED ORDER — AMOXICILLIN 500 MG PO CAPS
500.0000 mg | ORAL_CAPSULE | Freq: Once | ORAL | Status: AC
  Administered 2017-05-03: 500 mg via ORAL
  Filled 2017-05-03: qty 1

## 2017-05-03 MED ORDER — IBUPROFEN 600 MG PO TABS
600.0000 mg | ORAL_TABLET | Freq: Once | ORAL | Status: AC
  Administered 2017-05-03: 600 mg via ORAL
  Filled 2017-05-03: qty 1

## 2017-05-03 NOTE — ED Provider Notes (Signed)
[facility] Emergency Department Provider Note   ____________________________________________   First MD Initiated Contact with Patient 05/03/17 2140     (approximate)  I have reviewed the triage vital signs and the nursing notes.   HISTORY  Chief Complaint Dental Pain    HPI Brenda Acosta is a 41 y.o. female patient complaining of pain right lower molar that began 2 days ago. Patient state today she noticed swelling and redness to her gums.patient rates pain as 8/10. Patient described a pain as "shooting/throbbing". No palliative measures for this complaint. 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   Past Medical History:  Diagnosis Date  . Chest pain    a. 09/2015 Ex MV: EF >55%, no ischemia/infarct.  . Diabetes mellitus without complication ([place])    a. 11/20/2015 A1c 11.5;  b.  noncompliant w/ diabetes meds, glucose checks, f/u, etc.  . Dyspnea on exertion    a. 09/2015 Echo: Ef 60-65%, no rwma, mild AI, mildly dil LA.  . Essential hypertension   . Hyperlipidemia   . Morbid obesity ([place])   . Mucinous cystadenoma of ovary    Hx of   . Noncompliance     Patient Active Problem List   Diagnosis Date Noted  . Dyspnea on exertion   . Chest pain   . Noncompliance   . Morbid obesity ([place])   . Hyperlipidemia   . Diabetes mellitus without complication ([place])   . Chest pain on exertion 08/29/2015  . Bilateral leg edema 08/29/2015  . Type II or unspecified type diabetes mellitus with ophthalmic manifestations, uncontrolled 06/10/2013  . Essential hypertension 06/10/2013  . Other and unspecified hyperlipidemia 06/10/2013  . Mucinous cystadenoma of ovary 06/10/2013    Past Surgical History:  Procedure Laterality Date  . ABDOMINAL HYSTERECTOMY     partial hyst  . MOUTH SURGERY    . OOPHORECTOMY  07/2006   Rt; large benign ovarian tumor    Prior to Admission medications   Medication Sig Start Date End Date Taking? Authorizing Provider    amoxicillin (AMOXIL) 500 MG capsule Take 1 capsule (500 mg total) by mouth 3 (three) times daily. 05/03/17   Sable Feil, PA-C  aspirin 81 MG chewable tablet Chew 81 mg by mouth daily.    [provider]  BAYER MICROLET LANCETS lancets Use to test blood sugar 2 time daily as instructed. 02/12/16   Philemon Kingdom, MD  Blood Glucose Monitoring Suppl (BAYER CONTOUR NEXT MONITOR) w/Device KIT Use to test blood sugar daily. 02/08/16   Philemon Kingdom, MD  fluconazole (DIFLUCAN) 150 MG tablet Take 1 tablet (150 mg total) by mouth once. Patient taking differently: Take 150 mg by mouth as needed.  11/24/15   Philemon Kingdom, MD  furosemide (LASIX) 20 MG tablet TAKE ONE TABLET BY MOUTH ONCE DAILY 02/04/17   Rogelia Mire, NP  glipiZIDE (GLUCOTROL) 10 MG tablet Take 1 tablet (10 mg total) by mouth 2 (two) times daily before a meal. 11/20/15   Philemon Kingdom, MD  glucose blood (BAYER CONTOUR NEXT TEST) test strip Use to test blood sugar 2 times daily as instructed. 02/12/16   Philemon Kingdom, MD  ibuprofen (ADVIL,MOTRIN) 600 MG tablet Take 600 mg by mouth every 6 (six) hours as needed for pain.    [provider]  ibuprofen (ADVIL,MOTRIN) 600 MG tablet Take 1 tablet (600 mg total) by mouth every 8 (eight) hours as needed. 05/03/17   Sable Feil, PA-C  INVOKANA 300 MG  TABS tablet TAKE ONE TABLET BY MOUTH ONCE DAILY BEFORE BREAKFAST 03/18/17   Philemon Kingdom, MD  JUNEL FE 1/20 1-20 MG-MCG tablet Take 1 tablet by mouth daily.  11/07/15   [provider]  KLOR-CON M10 10 MEQ tablet Take 10 mEq by mouth daily.  08/16/15   [provider]  lisinopril-hydrochlorothiazide (PRINZIDE,ZESTORETIC) 20-25 MG per tablet Take 1 tablet by mouth daily.    [provider]  metFORMIN (GLUCOPHAGE) 1000 MG tablet Take 1 tablet (1,000 mg total) by mouth 2 (two) times daily with a meal. 11/20/15   Philemon Kingdom, MD  pravastatin (PRAVACHOL) 40 MG tablet Take 40 mg by  mouth daily.    [provider]  traMADol (ULTRAM) 50 MG tablet Take 1 tablet (50 mg total) by mouth every 6 (six) hours as needed. 05/03/17 05/03/18  Sable Feil, PA-C    Allergies Benzoyl peroxide  Family History  Problem Relation Age of Onset  . Heart disease Mother 67       heart attack  . Hypertension Father   . Diabetes Father        Type II  . Cancer Maternal Grandfather        prostate cancer    Social History Social History  Substance Use Topics  . Smoking status: Never Smoker  . Smokeless tobacco: Never Used  . Alcohol use No    Review of Systems  Constitutional: No fever/chills Eyes: No visual changes. ENT: No sore throat. Dental pain Cardiovascular: Denies chest pain. Respiratory: Denies shortness of breath. Gastrointestinal: No abdominal pain.  No nausea, no vomiting.  No diarrhea.  No constipation. Genitourinary: Negative for dysuria. Musculoskeletal: Negative for back pain. Skin: Negative for rash. Neurological: Negative for headaches, focal weakness or numbness. Endocrine:diabetes, hyperlipidemia, and hypertension. ____________________________________________   PHYSICAL EXAM:  VITAL SIGNS: ED Triage Vitals  Enc Vitals Group     BP 05/03/17 2016 (!) 148/97     Pulse Rate 05/03/17 2016 95     Resp 05/03/17 2016 15     Temp 05/03/17 2016 98.3 F (36.8 C)     Temp Source 05/03/17 2016 Oral     SpO2 05/03/17 2016 100 %     Weight 05/03/17 2017 255 lb (115.7 kg)     Height 05/03/17 2017 5' 6"  (1.676 m)     Head Circumference --      Peak Flow --      Pain Score 05/03/17 2016 8     Pain Loc --      Pain Edu? --      Excl. in [place]? --     Constitutional: Alert and oriented. Well appearing and in no acute distress.  Mouth/Throat: Mucous membranes are moist.  Oropharynx non-erythematous. Devitalize tooth #19 with gingival edema. Neck: No stridor.  No cervical spine tenderness to palpation. Hematological/Lymphatic/Immunilogical: No  cervical lymphadenopathy. Cardiovascular: Normal rate, regular rhythm. Grossly normal heart sounds.  Good peripheral circulation. Respiratory: Normal respiratory effort.  No retractions. Lungs CTAB. Neurologic:  Normal speech and language. No gross focal neurologic deficits are appreciated. No gait instability. Skin:  Skin is warm, dry and intact. No rash noted. Psychiatric: Mood and affect are normal. Speech and behavior are normal.  ____________________________________________   LABS (all labs ordered are listed, but only abnormal results are displayed)  Labs Reviewed - No data to display ____________________________________________  EKG   ____________________________________________  RADIOLOGY  No results found.  ____________________________________________   PROCEDURES  Procedure(s) performed: None  Procedures  Critical Care performed: No  ____________________________________________   INITIAL IMPRESSION / ASSESSMENT AND PLAN / ED COURSE  Pertinent labs & imaging results that were available during my care of the patient were reviewed by me and considered in my medical decision making (see chart for details).  Dental pain secondary to devitalize tooth. Patient given discharge care instructions and advised follow-up with treating dentist.      ____________________________________________   FINAL CLINICAL IMPRESSION(S) / ED DIAGNOSES  Final diagnoses:  Pain, dental      NEW MEDICATIONS STARTED DURING THIS VISIT:  New Prescriptions   AMOXICILLIN (AMOXIL) 500 MG CAPSULE    Take 1 capsule (500 mg total) by mouth 3 (three) times daily.   IBUPROFEN (ADVIL,MOTRIN) 600 MG TABLET    Take 1 tablet (600 mg total) by mouth every 8 (eight) hours as needed.   TRAMADOL (ULTRAM) 50 MG TABLET    Take 1 tablet (50 mg total) by mouth every 6 (six) hours as needed.     Note:  This document was prepared using Dragon voice recognition software and may include  unintentional dictation errors.    Sable Feil, PA-C 05/03/17 2148    Darel Hong, MD 05/07/17 1919

## 2017-05-03 NOTE — ED Notes (Signed)
Pt c/o R sided lower dental pain. Pt states recently had braces removed, has a crown on that side, and can feel a bump. Pt states she believes it is infected, tried to make appt with her dentist but is unable to get through the weekend.

## 2017-05-03 NOTE — ED Triage Notes (Signed)
Patient c/o lower right mouth pain beginning Thursday. Pt reports swelling to gums.

## 2017-05-03 NOTE — Discharge Instructions (Signed)
Advised to follow her treating dental for definitive evaluation and treatment.

## 2017-05-20 DIAGNOSIS — I10 Essential (primary) hypertension: Secondary | ICD-10-CM | POA: Diagnosis not present

## 2017-05-20 DIAGNOSIS — E119 Type 2 diabetes mellitus without complications: Secondary | ICD-10-CM | POA: Diagnosis not present

## 2017-05-20 DIAGNOSIS — R638 Other symptoms and signs concerning food and fluid intake: Secondary | ICD-10-CM | POA: Diagnosis not present

## 2017-05-22 DIAGNOSIS — E119 Type 2 diabetes mellitus without complications: Secondary | ICD-10-CM | POA: Diagnosis not present

## 2017-05-22 DIAGNOSIS — Z713 Dietary counseling and surveillance: Secondary | ICD-10-CM | POA: Diagnosis not present

## 2017-05-28 DIAGNOSIS — Z01818 Encounter for other preprocedural examination: Secondary | ICD-10-CM | POA: Diagnosis not present

## 2017-05-28 DIAGNOSIS — R0602 Shortness of breath: Secondary | ICD-10-CM | POA: Diagnosis not present

## 2017-06-03 DIAGNOSIS — K760 Fatty (change of) liver, not elsewhere classified: Secondary | ICD-10-CM | POA: Insufficient documentation

## 2017-06-06 DIAGNOSIS — R0683 Snoring: Secondary | ICD-10-CM | POA: Diagnosis not present

## 2017-06-12 DIAGNOSIS — I10 Essential (primary) hypertension: Secondary | ICD-10-CM | POA: Diagnosis not present

## 2017-06-12 DIAGNOSIS — E119 Type 2 diabetes mellitus without complications: Secondary | ICD-10-CM | POA: Diagnosis not present

## 2017-06-12 DIAGNOSIS — R638 Other symptoms and signs concerning food and fluid intake: Secondary | ICD-10-CM | POA: Diagnosis not present

## 2017-06-20 ENCOUNTER — Other Ambulatory Visit
Admission: RE | Admit: 2017-06-20 | Discharge: 2017-06-20 | Disposition: A | Discharge status: Home / Self Care | Payer: BLUE CROSS/BLUE SHIELD | Source: Ambulatory Visit | Attending: Physician Assistant | Admitting: Physician Assistant

## 2017-06-20 ENCOUNTER — Ambulatory Visit (INDEPENDENT_AMBULATORY_CARE_PROVIDER_SITE_OTHER): Payer: BLUE CROSS/BLUE SHIELD | Admitting: Physician Assistant

## 2017-06-20 ENCOUNTER — Encounter: Payer: Self-pay | Admitting: Physician Assistant

## 2017-06-20 VITALS — BP 106/69 | HR 99 | Ht 66.0 in | Wt 255.5 lb

## 2017-06-20 DIAGNOSIS — Z01810 Encounter for preprocedural cardiovascular examination: Secondary | ICD-10-CM

## 2017-06-20 DIAGNOSIS — I10 Essential (primary) hypertension: Secondary | ICD-10-CM | POA: Diagnosis not present

## 2017-06-20 DIAGNOSIS — E118 Type 2 diabetes mellitus with unspecified complications: Secondary | ICD-10-CM

## 2017-06-20 LAB — BASIC METABOLIC PANEL
ANION GAP: 7 (ref 5–15)
BUN: 24 mg/dL — ABNORMAL HIGH (ref 6–20)
CALCIUM: 8.8 mg/dL — AB (ref 8.9–10.3)
CHLORIDE: 103 mmol/L (ref 101–111)
CO2: 23 mmol/L (ref 22–32)
CREATININE: 1 mg/dL (ref 0.44–1.00)
GFR calc non Af Amer: >60 mL/min (ref >60)
Glucose, Bld: 213 mg/dL — ABNORMAL HIGH (ref 65–99)
Potassium: 4.1 mmol/L (ref 3.5–5.1)
SODIUM: 133 mmol/L — AB (ref 135–145)

## 2017-06-20 LAB — PREGNANCY, URINE: PREG TEST UR: NEGATIVE

## 2017-06-20 NOTE — Progress Notes (Signed)
Cardiology Office Note Date:  06/20/2017  Patient ID:  Brenda Acosta, Brenda Acosta 09-20-1976, MRN [id number] PCP:  Marda Stalker, PA-C  Cardiologist:  Dr. Fletcher Anon, MD    Chief Complaint: Pre-operative cardiac evaluation  History of Present Illness: Brenda Acosta is a 41 y.o. female with history of poorly controlled diabetes dating back to 2007, HTN, HLD, dyspnea, chest pain, morbid obesity, and intermittent noncompliance presents for pre-operative cardiac evaluation.   Prior echo in the summer of 2006 in the setting of LE edema and dyspnea on exertion showed normal LV systolic function. She also underwent nuclear stress testing at that time that was low risk. She was last seen in the office in 11/2015 for routine follow up and was doing well from a cardiac standpoint. She had began to exercise 6 days per week along with eating a healthier diet with improvement in her chest pain and dyspnea. At that time she had recently followed up with endocrinology and it was noted she had been off all of her medications. HGB A1c was 11.5. She has been seeing her PCP for her diabetes care. She was seen in the ED on 05/03/2017 for dental pain with a devitalized tooth. Treated with amoxicillin and tramadol.  Patient comes in today for preoperative evaluation of possible bariatric surgery. She indicates she is doing well from a cardiac standpoint and has been without any further exertional shortness of breath or chest pain. No symptoms at rest. She reports to me that her symptoms noted above in 2016 were in the setting of increased stress at work with a job promotion as well as in the setting of her daughter graduating from college. She feels like they were more stress related and since things have calmed down she has felt much better. She is in the process of being cleared for bariatric surgery and is doing well with this at this time. She reports she is now vegan. She continues to struggle with her blood sugars  with a self-reported recent hemoglobin A1c of approximately 10 (I do not have this for review at this time). She reports having lost approximately 85 pounds. By our scales, patient has lost 12 pounds when comparing office visit on 11/28/2015 to today. She has also noted her blood pressure is now running in the low 342A systolic. She has been asymptomatic from this. She has continued to take Lasix 20 mg daily and lisinopril/HCTZ 20/25 mg daily. She does report on most days she works out at [employer] for 30 minutes in the morning and 30 minutes in the evening and has not had any symptoms concerning for chest pain or shortness of breath. She reports in the summer months she does note mild lower extremity swelling and wears compression stockings for this along with her above diuretic therapy. She does not have any cardiac concerns at this time.   Past Medical History:  Diagnosis Date  . Chest pain    a. 09/2015 Ex MV: EF >55%, no ischemia/infarct.  . Diabetes mellitus without complication ([place])    a. 11/20/2015 A1c 11.5;  b.  noncompliant w/ diabetes meds, glucose checks, f/u, etc.  . Dyspnea on exertion    a. 09/2015 Echo: Ef 60-65%, no rwma, mild AI, mildly dil LA.  . Essential hypertension   . Hyperlipidemia   . Morbid obesity ([place])   . Mucinous cystadenoma of ovary    Hx of   . Noncompliance     Past Surgical History:  Procedure Laterality Date  .  ABDOMINAL HYSTERECTOMY     partial hyst  . MOUTH SURGERY    . OOPHORECTOMY  07/2006   Rt; large benign ovarian tumor    Current Meds  Medication Sig  . aspirin 81 MG chewable tablet Chew 81 mg by mouth daily.  Marland Kitchen BAYER MICROLET LANCETS lancets Use to test blood sugar 2 time daily as instructed.  . Blood Glucose Monitoring Suppl (BAYER CONTOUR NEXT MONITOR) w/Device KIT Use to test blood sugar daily.  . furosemide (LASIX) 20 MG tablet Take 20 mg by mouth daily.  Marland Kitchen glipiZIDE (GLUCOTROL) 10 MG tablet Take 1 tablet (10 mg total) by mouth 2 (two)  times daily before a meal.  . glucose blood (BAYER CONTOUR NEXT TEST) test strip Use to test blood sugar 2 times daily as instructed.  Marland Kitchen ibuprofen (ADVIL,MOTRIN) 600 MG tablet Take 600 mg by mouth every 6 (six) hours as needed for pain.  Marland Kitchen ibuprofen (ADVIL,MOTRIN) 600 MG tablet Take 1 tablet (600 mg total) by mouth every 8 (eight) hours as needed.  . INVOKANA 300 MG TABS tablet TAKE ONE TABLET BY MOUTH ONCE DAILY BEFORE BREAKFAST  . KLOR-CON M10 10 MEQ tablet Take 10 mEq by mouth daily.   Marland Kitchen lisinopril-hydrochlorothiazide (PRINZIDE,ZESTORETIC) 20-25 MG per tablet Take 1 tablet by mouth daily.  . traMADol (ULTRAM) 50 MG tablet Take 1 tablet (50 mg total) by mouth every 6 (six) hours as needed.  . [DISCONTINUED] furosemide (LASIX) 20 MG tablet TAKE ONE TABLET BY MOUTH ONCE DAILY    Allergies:   Benzoyl peroxide   Social History:  The patient  reports that she has never smoked. She has never used smokeless tobacco. She reports that she does not drink alcohol or use drugs.   Family History:  The patient's family history includes Cancer in her maternal grandfather; Diabetes in her father; Heart disease (age of onset: 19) in her mother; Hypertension in her father.  ROS:   Review of Systems  Constitutional: Negative for chills, diaphoresis, fever, malaise/fatigue and weight loss.  HENT: Negative for congestion.   Eyes: Negative for discharge and redness.  Respiratory: Negative for cough, hemoptysis, sputum production, shortness of breath and wheezing.   Cardiovascular: Negative for chest pain, palpitations, orthopnea, claudication, leg swelling and PND.  Gastrointestinal: Negative for abdominal pain, blood in stool, heartburn, melena, nausea and vomiting.  Genitourinary: Negative for hematuria.  Musculoskeletal: Negative for falls and myalgias.  Skin: Negative for rash.  Neurological: Negative for dizziness, tingling, tremors, sensory change, speech change, focal weakness, loss of consciousness  and weakness.  Endo/Heme/Allergies: Does not bruise/bleed easily.  Psychiatric/Behavioral: Negative for substance abuse. The patient is not nervous/anxious.   All other systems reviewed and are negative.    PHYSICAL EXAM:  VS:  BP 106/69 (BP Location: Left Arm, Patient Position: Sitting, Cuff Size: Normal)   Pulse 99   Ht _0  (1.676 m)   Wt 255 lb 8 oz (115.9 kg)   BMI 41.24 kg/m  BMI: Body mass index is 41.24 kg/m.  Physical Exam  Constitutional: She is oriented to person, place, and time. She appears well-developed and well-nourished.  HENT:  Head: Normocephalic and atraumatic.  Eyes: Right eye exhibits no discharge. Left eye exhibits no discharge.  Neck: Normal range of motion. No JVD present.  Cardiovascular: Normal rate, regular rhythm, S1 normal, S2 normal and normal heart sounds.  Exam reveals no distant heart sounds, no friction rub, no midsystolic click and no opening snap.   No murmur heard. Pulmonary/Chest: Effort  normal and breath sounds normal. No respiratory distress. She has no decreased breath sounds. She has no wheezes. She has no rales. She exhibits no tenderness.  Abdominal: Soft. She exhibits no distension. There is no tenderness.  Musculoskeletal: She exhibits edema.  Trace bilateral pretibial edema  Neurological: She is alert and oriented to person, place, and time.  Skin: Skin is warm and dry. No cyanosis. Nails show no clubbing.  Psychiatric: She has a normal mood and affect. Her speech is normal and behavior is normal. Judgment and thought content normal.     EKG:  Was ordered and interpreted by me today. Shows NSR, 99 bpm, no acute st/t changes   Recent Labs: No results found for requested labs within last 8760 hours.  No results found for requested labs within last 8760 hours.   CrCl cannot be calculated (Patient's most recent lab result is older than the maximum 21 days allowed.).   Wt Readings from Last 3 Encounters:  06/20/17 255 lb 8 oz  (115.9 kg)  05/03/17 255 lb (115.7 kg)  11/28/15 267 lb (121.1 kg)     Other studies reviewed: Additional studies/records reviewed today include: summarized above  ASSESSMENT AND PLAN:  1. Pre-operative cardiac evaluation: -PREOPERATIVE CARDIAC RISK ASSESSMENT:   Revised Cardiac Risk Index:  High Risk Surgery (defined as Intraperitoneal, intrathoracic or suprainguinal vascular): yes; (bariatric)  Active CAD: no  CHF: no  Cerebrovascular Disease: no   Diabetes, on insulin: no (DM, not on insulin)  CKD (Cr >~ 2): no   Total: 0 Estimated Risk of Adverse Outcome: low risk for high risk surgery. Estimated Risk of MI, PE, VF/VT (Cardiac Arrest), Complete Heart Block: 0.9%   ACC/AHA Guidelines for "Clearance":  Step 1 - Need for Emergency Surgery: no  If Yes - go straight to OR with perioperative surveillance  Step 2 - Active Cardiac Conditions (Unstable Angina, Decompensated HF, Significant  Arrhytmias - Complete HB, Mobitz II, Symptomatic VT or SVT, Severe Aortic Stenosis - mean gradient > 40 mmHg, Valve area < 1.0 cm2): no   If Yes - Evaluate & Treat per ACC/AHA Guidelines  Step 3 -  Low Risk Surgery: no  If Yes --> proceed to OR  If No --> Step 4  Step 4 - Functional Capacity >= 4 METS without symptoms: yes;  42.7 METs per Duke Activity Index  If Yes --> proceed to OR  If No --> Step 5  Step 5 --  Clinical Risk Factors (CRF)  - Zero --> proceed to OR   -Patient is doing well from a cardiac standpoint and is without symptoms concerning for ischemia. She is scheduled for a high risk surgery as above. Given the high risk nature of her surgery we will risk stratify her with a treadmill Myoview to evaluate for high-risk ischemia. EKG at today's visit is not acute. If her stress test is low risk, she would be cleared from a cardiac standpoint at low to moderate risk given her past medical history and surgical procedure planned. A note will be sent to the bariatric  physician Kreg Shropshire, MD) Phone: [phone]; Fax: [phone] once the above stress testing has been completed. I have not evaluated the patient from a medical standpoint, only cardiac.  2.   Essential hypertension: Blood pressure on the softer side today. She indicates over the past several months for blood pressure has been running in the low 100s. Given this, I will stop her Lasix. I will check a be met  to evaluate renal function and potassium level. She will continue lisinopril/HCTZ per ordering provider.  3.    Poorly controlled type 2 diabetes mellitus: Followed by PCP.  4.    Morbid obesity: Planning for bariatric surgery as above.  Disposition: F/u with me in 6 months if stress test is normal, sooner if needed.   Current medicines are reviewed at length with the patient today.  The patient did not have any concerns regarding medicines.  Melvern Banker PA-C 06/20/2017 2:27 PM     [address] [place], [place] [address] [phone]

## 2017-06-20 NOTE — Patient Instructions (Addendum)
Medication Instructions:  Your physician has recommended you make the following change in your medication:  STOP taking lasix   Labwork: BMET today at the [facility] lab Urine pregnancy test today at the [facility]  Testing/Procedures: Your physician has requested that you have a lexiscan myoview. For further information please visit [email or web address]. Please follow instruction sheet, as given.  [place]  Your caregiver has ordered a Stress Test with nuclear imaging. The purpose of this test is to evaluate the blood supply to your heart muscle. This procedure is referred to as a "Non-Invasive Stress Test." This is because other than having an IV started in your vein, nothing is inserted or "invades" your body. Cardiac stress tests are done to find areas of poor blood flow to the heart by determining the extent of coronary artery disease (CAD). Some patients exercise on a treadmill, which naturally increases the blood flow to your heart, while others who are  unable to walk on a treadmill due to physical limitations have a pharmacologic/chemical stress agent called Lexiscan . This medicine will mimic walking on a treadmill by temporarily increasing your coronary blood flow.   Please note: these test may take anywhere between 2-4 hours to complete  PLEASE REPORT TO [facility] AT THE FIRST DESK WILL DIRECT YOU WHERE TO GO  Date of Procedure: Tuesday, July 24 Arrival Time for Procedure:   7:45am  Instructions regarding medication:   __xx__ : Hold diabetes medication morning of procedure   _xx___:  Hold other medications as follows: Do not take lisinopril/HCTZ the morning of your test.   PLEASE NOTIFY THE OFFICE AT LEAST 24 HOURS IN ADVANCE IF YOU ARE UNABLE TO KEEP YOUR APPOINTMENT.  [phone] AND  PLEASE NOTIFY NUCLEAR MEDICINE AT [facility] AT LEAST 24 HOURS IN ADVANCE IF YOU ARE UNABLE TO KEEP YOUR APPOINTMENT. [phone]  How to prepare for  your Myoview test:  1. Do not eat or drink after midnight 2. No caffeine for 24 hours prior to test 3. No smoking 24 hours prior to test. 4. Your medication may be taken with water.  If your doctor stopped a medication because of this test, do not take that medication. 5. Ladies, please do not wear dresses.  Skirts or pants are appropriate. Please wear a short sleeve shirt. 6. No perfume, cologne or lotion. 7. Wear comfortable walking shoes. No heels!            Follow-Up: Your physician wants you to follow-up in: 6 months with Christell Faith, PA-C.  You will receive a reminder letter in the mail two months in advance. If you don't receive a letter, please call our office to schedule the follow-up appointment.   Any Other Special Instructions Will Be Listed Below (If Applicable).     If you need a refill on your cardiac medications before your next appointment, please call your pharmacy.  Cardiac Nuclear Scan A cardiac nuclear scan is a test that measures blood flow to the heart when a person is resting and when he or she is exercising. The test looks for problems such as:  Not enough blood reaching a portion of the heart.  The heart muscle not working normally.  You may need this test if:  You have heart disease.  You have had abnormal lab results.  You have had heart surgery or angioplasty.  You have chest pain.  You have shortness of breath.  In this test, a radioactive dye (tracer)  is injected into your bloodstream. After the tracer has traveled to your heart, an imaging device is used to measure how much of the tracer is absorbed by or distributed to various areas of your heart. This procedure is usually done at a hospital and takes 2-4 hours. Tell a health care provider about:  Any allergies you have.  All medicines you are taking, including vitamins, herbs, eye drops, creams, and over-the-counter medicines.  Any problems you or family members have had with the  use of anesthetic medicines.  Any blood disorders you have.  Any surgeries you have had.  Any medical conditions you have.  Whether you are pregnant or may be pregnant. What are the risks? Generally, this is a safe procedure. However, problems may occur, including:  Serious chest pain and heart attack. This is only a risk if the stress portion of the test is done.  Rapid heartbeat.  Sensation of warmth in your chest. This usually passes quickly.  What happens before the procedure?  Ask your health care provider about changing or stopping your regular medicines. This is especially important if you are taking diabetes medicines or blood thinners.  Remove your jewelry on the day of the procedure. What happens during the procedure?  An IV tube will be inserted into one of your veins.  Your health care provider will inject a small amount of radioactive tracer through the tube.  You will wait for 20-40 minutes while the tracer travels through your bloodstream.  Your heart activity will be monitored with an electrocardiogram (ECG).  You will lie down on an exam table.  Images of your heart will be taken for about 15-20 minutes.  You may be asked to exercise on a treadmill or stationary bike. While you exercise, your heart's activity will be monitored with an ECG, and your blood pressure will be checked. If you are unable to exercise, you may be given a medicine to increase blood flow to parts of your heart.  When blood flow to your heart has peaked, a tracer will again be injected through the IV tube.  After 20-40 minutes, you will get back on the exam table and have more images taken of your heart.  When the procedure is over, your IV tube will be removed. The procedure may vary among health care providers and hospitals. Depending on the type of tracer used, scans may need to be repeated 3-4 hours later. What happens after the procedure?  Unless your health care provider  tells you otherwise, you may return to your normal schedule, including diet, activities, and medicines.  Unless your health care provider tells you otherwise, you may increase your fluid intake. This will help flush the contrast dye from your body. Drink enough fluid to keep your urine clear or pale yellow.  It is up to you to get your test results. Ask your health care provider, or the department that is doing the test, when your results will be ready. Summary  A cardiac nuclear scan measures the blood flow to the heart when a person is resting and when he or she is exercising.  You may need this test if you are at risk for heart disease.  Tell your health care provider if you are pregnant.  Unless your health care provider tells you otherwise, increase your fluid intake. This will help flush the contrast dye from your body. Drink enough fluid to keep your urine clear or pale yellow. This information is not intended to  replace advice given to you by your health care provider. Make sure you discuss any questions you have with your health care provider. Document Released: 12/13/2004 Document Revised: 11/20/2016 Document Reviewed: 10/27/2013 Elsevier Interactive Patient Education  2017 [employer].

## 2017-06-24 ENCOUNTER — Ambulatory Visit
Admission: RE | Admit: 2017-06-24 | Discharge: 2017-06-24 | Disposition: A | Discharge status: Home / Self Care | Payer: BLUE CROSS/BLUE SHIELD | Source: Ambulatory Visit | Attending: Physician Assistant | Admitting: Physician Assistant

## 2017-06-24 DIAGNOSIS — Z01810 Encounter for preprocedural cardiovascular examination: Secondary | ICD-10-CM | POA: Insufficient documentation

## 2017-06-24 DIAGNOSIS — E559 Vitamin D deficiency, unspecified: Secondary | ICD-10-CM | POA: Insufficient documentation

## 2017-06-24 DIAGNOSIS — E119 Type 2 diabetes mellitus without complications: Secondary | ICD-10-CM | POA: Diagnosis not present

## 2017-06-24 LAB — NM MYOCAR MULTI W/SPECT W/WALL MOTION / EF
CHL CUP RESTING HR STRESS: 85 {beats}/min
CSEPHR: 90 %
Estimated workload: 7 METS
Exercise duration (min): 6 min
Exercise duration (sec): 0 s
LVDIAVOL: 82 mL (ref 46–106)
LVSYSVOL: 32 mL
NUC STRESS TID: 1.04
Peak HR: 162 {beats}/min

## 2017-06-24 MED ORDER — TECHNETIUM TC 99M TETROFOSMIN IV KIT
30.0600 | PACK | Freq: Once | INTRAVENOUS | Status: AC | PRN
  Administered 2017-06-24: 30.06 via INTRAVENOUS

## 2017-06-24 MED ORDER — TECHNETIUM TC 99M TETROFOSMIN IV KIT
13.0000 | PACK | Freq: Once | INTRAVENOUS | Status: AC | PRN
  Administered 2017-06-24: 12.066 via INTRAVENOUS

## 2017-06-25 ENCOUNTER — Telehealth: Payer: Self-pay | Admitting: *Deleted

## 2017-06-25 NOTE — Telephone Encounter (Addendum)
Error

## 2017-07-28 DIAGNOSIS — E1165 Type 2 diabetes mellitus with hyperglycemia: Secondary | ICD-10-CM | POA: Diagnosis not present

## 2017-07-28 DIAGNOSIS — E781 Pure hyperglyceridemia: Secondary | ICD-10-CM | POA: Diagnosis not present

## 2017-07-28 DIAGNOSIS — Z5181 Encounter for therapeutic drug level monitoring: Secondary | ICD-10-CM | POA: Diagnosis not present

## 2017-07-28 DIAGNOSIS — Z79899 Other long term (current) drug therapy: Secondary | ICD-10-CM | POA: Diagnosis not present

## 2017-07-31 DIAGNOSIS — K649 Unspecified hemorrhoids: Secondary | ICD-10-CM | POA: Diagnosis not present

## 2017-08-18 DIAGNOSIS — Z9889 Other specified postprocedural states: Secondary | ICD-10-CM | POA: Diagnosis not present

## 2017-08-18 DIAGNOSIS — Z79899 Other long term (current) drug therapy: Secondary | ICD-10-CM | POA: Diagnosis not present

## 2017-08-18 DIAGNOSIS — K2970 Gastritis, unspecified, without bleeding: Secondary | ICD-10-CM | POA: Diagnosis not present

## 2017-08-18 DIAGNOSIS — I10 Essential (primary) hypertension: Secondary | ICD-10-CM | POA: Diagnosis not present

## 2017-08-18 DIAGNOSIS — E119 Type 2 diabetes mellitus without complications: Secondary | ICD-10-CM | POA: Diagnosis not present

## 2017-08-18 DIAGNOSIS — K2950 Unspecified chronic gastritis without bleeding: Secondary | ICD-10-CM | POA: Diagnosis not present

## 2017-09-17 DIAGNOSIS — N898 Other specified noninflammatory disorders of vagina: Secondary | ICD-10-CM | POA: Diagnosis not present

## 2017-09-17 DIAGNOSIS — Z119 Encounter for screening for infectious and parasitic diseases, unspecified: Secondary | ICD-10-CM | POA: Diagnosis not present

## 2017-09-17 DIAGNOSIS — B373 Candidiasis of vulva and vagina: Secondary | ICD-10-CM | POA: Diagnosis not present

## 2017-09-17 DIAGNOSIS — R87810 Cervical high risk human papillomavirus (HPV) DNA test positive: Secondary | ICD-10-CM | POA: Diagnosis not present

## 2017-10-13 ENCOUNTER — Other Ambulatory Visit: Payer: Self-pay | Admitting: Internal Medicine

## 2017-11-19 DIAGNOSIS — Z0000 Encounter for general adult medical examination without abnormal findings: Secondary | ICD-10-CM | POA: Diagnosis not present

## 2017-11-19 DIAGNOSIS — Z862 Personal history of diseases of the blood and blood-forming organs and certain disorders involving the immune mechanism: Secondary | ICD-10-CM | POA: Diagnosis not present

## 2017-11-19 DIAGNOSIS — E113299 Type 2 diabetes mellitus with mild nonproliferative diabetic retinopathy without macular edema, unspecified eye: Secondary | ICD-10-CM | POA: Diagnosis not present

## 2017-11-19 DIAGNOSIS — I10 Essential (primary) hypertension: Secondary | ICD-10-CM | POA: Diagnosis not present

## 2018-01-02 ENCOUNTER — Telehealth: Payer: Self-pay | Admitting: Cardiovascular Disease

## 2018-01-02 NOTE — Telephone Encounter (Signed)
Pt last saw Brenda Faith, PA-C July 2018 for pre-operative cardiac evaluation for bariatric clearance. She had a low risk nuclear test July 24   Low risk study without evidence of ischemia.  There is a small in size, mild in severity, fixed apical and apical anterior defect that most likely represents attenuation artifact.  The left ventricular ejection fraction is normal (56%).     and instructed to follow up in 6 months (January 2019). No appointment has been scheduled. Will route to [facility] for clearance consideration.

## 2018-01-02 NOTE — Telephone Encounter (Signed)
° °  [facility] Pre-operative Risk Assessment    Request for surgical clearance:  1. 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 When is this surgery scheduled? 01/08/18  3. What type of clearance is required (medical clearance vs. Pharmacy clearance to hold med vs. Both)?   4. Are there any medications that need to be held prior to surgery and how long? Aspirin    5. Practice name and name of physician performing surgery? [facility], Dr Kreg Shropshire   6. What is your office phone and fax number? Phone [phone] Fax [phone]   7. Anesthesia type (None, local, MAC, general) ?    Caryl Pina Gerringer 01/02/2018, 1:16 PM  _________________________________________________________________   (provider comments below)

## 2018-01-02 NOTE — Telephone Encounter (Signed)
I spoke with patient who reports feeling "really, really good", SBP always under 120.  Denies any chest pain or SOB.  Reviewed with Christell Faith, PA-C who advises to hold aspirin 81mg  x 5 days before procedure.  Routed note to [facility] [employer]

## 2018-01-02 NOTE — Telephone Encounter (Signed)
Patient underwent office visit and treadmill Myoview in 06/2018 for pre-operative evaluation with results being low risk. She was risk stratified at that time as being low to moderate risk for non-cardiac surgery. Please call the patient to assess how she is doing. If asymptomatic, given recent low risk Myoview in 06/2017, no further cardiac evaluation would be needed and she would still be low to moderate risk.

## 2018-01-08 HISTORY — PX: LAPAROSCOPIC GASTRIC SLEEVE RESECTION: SHX5895

## 2018-01-09 DIAGNOSIS — R578 Other shock: Secondary | ICD-10-CM | POA: Diagnosis not present

## 2018-01-09 DIAGNOSIS — E872 Acidosis: Secondary | ICD-10-CM | POA: Diagnosis not present

## 2018-01-10 DIAGNOSIS — R578 Other shock: Secondary | ICD-10-CM | POA: Diagnosis not present

## 2018-01-27 DIAGNOSIS — Z713 Dietary counseling and surveillance: Secondary | ICD-10-CM | POA: Diagnosis not present

## 2018-01-27 DIAGNOSIS — I10 Essential (primary) hypertension: Secondary | ICD-10-CM | POA: Diagnosis not present

## 2018-01-27 DIAGNOSIS — Z9884 Bariatric surgery status: Secondary | ICD-10-CM | POA: Diagnosis not present

## 2018-01-27 DIAGNOSIS — E119 Type 2 diabetes mellitus without complications: Secondary | ICD-10-CM | POA: Diagnosis not present

## 2018-04-08 DIAGNOSIS — Z9884 Bariatric surgery status: Secondary | ICD-10-CM | POA: Insufficient documentation

## 2018-04-21 ENCOUNTER — Other Ambulatory Visit: Payer: Self-pay | Admitting: Obstetrics and Gynecology

## 2018-04-21 ENCOUNTER — Other Ambulatory Visit (HOSPITAL_COMMUNITY)
Admission: RE | Admit: 2018-04-21 | Discharge: 2018-04-21 | Disposition: A | Discharge status: Home / Self Care | Payer: BLUE CROSS/BLUE SHIELD | Source: Ambulatory Visit | Attending: Obstetrics and Gynecology | Admitting: Obstetrics and Gynecology

## 2018-04-21 DIAGNOSIS — L292 Pruritus vulvae: Secondary | ICD-10-CM | POA: Diagnosis not present

## 2018-04-21 DIAGNOSIS — Z01411 Encounter for gynecological examination (general) (routine) with abnormal findings: Secondary | ICD-10-CM | POA: Diagnosis not present

## 2018-04-21 DIAGNOSIS — N921 Excessive and frequent menstruation with irregular cycle: Secondary | ICD-10-CM | POA: Diagnosis not present

## 2018-04-21 DIAGNOSIS — D219 Benign neoplasm of connective and other soft tissue, unspecified: Secondary | ICD-10-CM | POA: Diagnosis not present

## 2018-04-22 ENCOUNTER — Other Ambulatory Visit: Payer: Self-pay | Admitting: Obstetrics and Gynecology

## 2018-04-22 ENCOUNTER — Other Ambulatory Visit: Payer: Self-pay | Admitting: Family Medicine

## 2018-04-22 DIAGNOSIS — Z5181 Encounter for therapeutic drug level monitoring: Secondary | ICD-10-CM | POA: Diagnosis not present

## 2018-04-22 DIAGNOSIS — Z1231 Encounter for screening mammogram for malignant neoplasm of breast: Secondary | ICD-10-CM

## 2018-04-22 DIAGNOSIS — E781 Pure hyperglyceridemia: Secondary | ICD-10-CM | POA: Diagnosis not present

## 2018-04-22 DIAGNOSIS — E1165 Type 2 diabetes mellitus with hyperglycemia: Secondary | ICD-10-CM | POA: Diagnosis not present

## 2018-04-22 LAB — CYTOLOGY - PAP
Diagnosis: NEGATIVE
HPV (WINDOPATH): NOT DETECTED

## 2018-05-07 DIAGNOSIS — F331 Major depressive disorder, recurrent, moderate: Secondary | ICD-10-CM | POA: Diagnosis not present

## 2018-05-12 ENCOUNTER — Ambulatory Visit: Payer: BLUE CROSS/BLUE SHIELD

## 2018-05-12 DIAGNOSIS — F331 Major depressive disorder, recurrent, moderate: Secondary | ICD-10-CM | POA: Diagnosis not present

## 2018-05-18 DIAGNOSIS — N921 Excessive and frequent menstruation with irregular cycle: Secondary | ICD-10-CM | POA: Diagnosis not present

## 2018-05-18 DIAGNOSIS — D250 Submucous leiomyoma of uterus: Secondary | ICD-10-CM | POA: Diagnosis not present

## 2018-05-26 ENCOUNTER — Ambulatory Visit
Admission: RE | Admit: 2018-05-26 | Discharge: 2018-05-26 | Disposition: A | Discharge status: Home / Self Care | Payer: BLUE CROSS/BLUE SHIELD | Source: Ambulatory Visit | Attending: Obstetrics and Gynecology | Admitting: Obstetrics and Gynecology

## 2018-05-26 DIAGNOSIS — Z1231 Encounter for screening mammogram for malignant neoplasm of breast: Secondary | ICD-10-CM | POA: Diagnosis not present

## 2018-06-01 DIAGNOSIS — F331 Major depressive disorder, recurrent, moderate: Secondary | ICD-10-CM | POA: Diagnosis not present

## 2018-08-12 DIAGNOSIS — Z9884 Bariatric surgery status: Secondary | ICD-10-CM | POA: Diagnosis not present

## 2018-08-12 DIAGNOSIS — Z6828 Body mass index (BMI) 28.0-28.9, adult: Secondary | ICD-10-CM | POA: Diagnosis not present

## 2019-02-10 DIAGNOSIS — Z9884 Bariatric surgery status: Secondary | ICD-10-CM | POA: Diagnosis not present

## 2019-02-10 DIAGNOSIS — Z6828 Body mass index (BMI) 28.0-28.9, adult: Secondary | ICD-10-CM | POA: Diagnosis not present

## 2019-04-27 ENCOUNTER — Telehealth: Payer: Self-pay

## 2019-04-27 NOTE — Telephone Encounter (Signed)
Pt called wanting to establish care with Dr Volanda Napoleon. Pt was advised to have her medical record faxed to our office for review by Dr Volanda Napoleon. Patient aware that the office will call her when medical records are received to schedule her an appointment to see dr Volanda Napoleon

## 2019-05-05 ENCOUNTER — Other Ambulatory Visit: Payer: Self-pay

## 2019-05-05 ENCOUNTER — Encounter: Payer: Self-pay | Admitting: Family Medicine

## 2019-05-05 ENCOUNTER — Ambulatory Visit (INDEPENDENT_AMBULATORY_CARE_PROVIDER_SITE_OTHER): Payer: BC Managed Care – PPO | Admitting: Family Medicine

## 2019-05-05 VITALS — BP 102/64 | HR 80 | Temp 98.3°F | Ht 66.0 in | Wt 184.0 lb

## 2019-05-05 DIAGNOSIS — R600 Localized edema: Secondary | ICD-10-CM

## 2019-05-05 DIAGNOSIS — E118 Type 2 diabetes mellitus with unspecified complications: Secondary | ICD-10-CM | POA: Diagnosis not present

## 2019-05-05 DIAGNOSIS — I10 Essential (primary) hypertension: Secondary | ICD-10-CM | POA: Diagnosis not present

## 2019-05-05 DIAGNOSIS — E282 Polycystic ovarian syndrome: Secondary | ICD-10-CM

## 2019-05-05 DIAGNOSIS — Z7689 Persons encountering health services in other specified circumstances: Secondary | ICD-10-CM

## 2019-05-05 LAB — POCT GLYCOSYLATED HEMOGLOBIN (HGB A1C): Hemoglobin A1C: 7.4 % — AB (ref 4.0–5.6)

## 2019-05-05 MED ORDER — FUROSEMIDE 20 MG PO TABS: ORAL_TABLET | ORAL | 3 refills | Status: DC

## 2019-05-05 MED ORDER — EMPAGLIFLOZIN-METFORMIN HCL ER 12.5-1000 MG PO TB24: 2.0000 | ORAL_TABLET | Freq: Every day | ORAL | 3 refills | Status: DC

## 2019-05-05 NOTE — Patient Instructions (Signed)
Managing Your Hypertension Hypertension is commonly called high blood pressure. This is when the force of your blood pressing against the walls of your arteries is too strong. Arteries are blood vessels that carry blood from your heart throughout your body. Hypertension forces the heart to work harder to pump blood, and may cause the arteries to become narrow or stiff. Having untreated or uncontrolled hypertension can cause heart attack, stroke, kidney disease, and other problems. What are blood pressure readings? A blood pressure reading consists of a higher number over a lower number. Ideally, your blood pressure should be below 120/80. The first ("top") number is called the systolic pressure. It is a measure of the pressure in your arteries as your heart beats. The second ("bottom") number is called the diastolic pressure. It is a measure of the pressure in your arteries as the heart relaxes. What does my blood pressure reading mean? Blood pressure is classified into four stages. Based on your blood pressure reading, your health care provider may use the following stages to determine what type of treatment you need, if any. Systolic pressure and diastolic pressure are measured in a unit called mm Hg. Normal  Systolic pressure: below 784.  Diastolic pressure: below 80. Elevated  Systolic pressure: 696-295.  Diastolic pressure: below 80. Hypertension stage 1  Systolic pressure: 284-132.  Diastolic pressure: 44-01. Hypertension stage 2  Systolic pressure: 027 or above.  Diastolic pressure: 90 or above. What health risks are associated with hypertension? Managing your hypertension is an important responsibility. Uncontrolled hypertension can lead to:  A heart attack.  A stroke.  A weakened blood vessel (aneurysm).  Heart failure.  Kidney damage.  Eye damage.  Metabolic syndrome.  Memory and concentration problems. What changes can I make to manage my hypertension?  Hypertension can be managed by making lifestyle changes and possibly by taking medicines. Your health care provider will help you make a plan to bring your blood pressure within a normal range. Eating and drinking   Eat a diet that is high in fiber and potassium, and low in salt (sodium), added sugar, and fat. An example eating plan is called the DASH (Dietary Approaches to Stop Hypertension) diet. To eat this way: ? Eat plenty of fresh fruits and vegetables. Try to fill half of your plate at each meal with fruits and vegetables. ? Eat whole grains, such as whole wheat pasta, brown rice, or whole grain bread. Fill about one quarter of your plate with whole grains. ? Eat low-fat diary products. ? Avoid fatty cuts of meat, processed or cured meats, and poultry with skin. Fill about one quarter of your plate with lean proteins such as fish, chicken without skin, beans, eggs, and tofu. ? Avoid premade and processed foods. These tend to be higher in sodium, added sugar, and fat.  Reduce your daily sodium intake. Most people with hypertension should eat less than 1,500 mg of sodium a day.  Limit alcohol intake to no more than 1 drink a day for nonpregnant women and 2 drinks a day for men. One drink equals 12 oz of beer, 5 oz of wine, or 1 oz of hard liquor. Lifestyle  Work with your health care provider to maintain a healthy body weight, or to lose weight. Ask what an ideal weight is for you.  Get at least 30 minutes of exercise that causes your heart to beat faster (aerobic exercise) most days of the week. Activities may include walking, swimming, or biking.  Include exercise  to strengthen your muscles (resistance exercise), such as weight lifting, as part of your weekly exercise routine. Try to do these types of exercises for 30 minutes at least 3 days a week.  Do not use any products that contain nicotine or tobacco, such as cigarettes and e-cigarettes. If you need help quitting, ask your health  care provider.  Control any long-term (chronic) conditions you have, such as high cholesterol or diabetes. Monitoring  Monitor your blood pressure at home as told by your health care provider. Your personal target blood pressure may vary depending on your medical conditions, your age, and other factors.  Have your blood pressure checked regularly, as often as told by your health care provider. Working with your health care provider  Review all the medicines you take with your health care provider because there may be side effects or interactions.  Talk with your health care provider about your diet, exercise habits, and other lifestyle factors that may be contributing to hypertension.  Visit your health care provider regularly. Your health care provider can help you create and adjust your plan for managing hypertension. Will I need medicine to control my blood pressure? Your health care provider may prescribe medicine if lifestyle changes are not enough to get your blood pressure under control, and if:  Your systolic blood pressure is 130 or higher.  Your diastolic blood pressure is 80 or higher. Take medicines only as told by your health care provider. Follow the directions carefully. Blood pressure medicines must be taken as prescribed. The medicine does not work as well when you skip doses. Skipping doses also puts you at risk for problems. Contact a health care provider if:  You think you are having a reaction to medicines you have taken.  You have repeated (recurrent) headaches.  You feel dizzy.  You have swelling in your ankles.  You have trouble with your vision. Get help right away if:  You develop a severe headache or confusion.  You have unusual weakness or numbness, or you feel faint.  You have severe pain in your chest or abdomen.  You vomit repeatedly.  You have trouble breathing. Summary  Hypertension is when the force of blood pumping through your arteries  is too strong. If this condition is not controlled, it may put you at risk for serious complications.  Your personal target blood pressure may vary depending on your medical conditions, your age, and other factors. For most people, a normal blood pressure is less than 120/80.  Hypertension is managed by lifestyle changes, medicines, or both. Lifestyle changes include weight loss, eating a healthy, low-sodium diet, exercising more, and limiting alcohol. This information is not intended to replace advice given to you by your health care provider. Make sure you discuss any questions you have with your health care provider. Document Released: 08/12/2012 Document Revised: 10/16/2016 Document Reviewed: 10/16/2016 Elsevier Interactive Patient Education  2019 [employer].  Diet for Polycystic Ovary Syndrome Polycystic ovary syndrome (PCOS) is a disorder of the chemicals (hormones) that regulate a woman's reproductive system, including monthly periods (menstruation). The condition causes important hormones to be out of balance. PCOS can:  Stop your periods or make them irregular.  Cause cysts to develop on your ovaries.  Make it difficult to get pregnant.  Stop your body from responding to the effects of insulin (insulin resistance). Insulin resistance can lead to obesity and diabetes. Changing what you eat can help you manage PCOS and improve your health. Following a balanced  diet can help you lose weight and improve the way that your body uses insulin. What are tips for following this plan?  Follow a balanced diet for meals and snacks. Eat breakfast, lunch, dinner, and one or two snacks every day.  Include protein in each meal and snack.  Choose whole grains instead of products that are made with refined flour.  Eat a variety of foods.  Exercise regularly as told by your health care provider. Aim to do 30 or more minutes of exercise on most days of the week.  If you are overweight or  obese: ? Pay attention to how many calories you eat. Cutting down on calories can help you lose weight. ? Work with your health care provider or a diet and nutrition specialist (dietitian) to figure out how many calories you need each day. What foods can I eat?  Fruits Include a variety of colors and types. All fruits are helpful for PCOS. Vegetables Include a variety of colors and types. All vegetables are helpful for PCOS. Grains Whole grains, such as whole wheat. Whole-grain breads, crackers, cereals, and pasta. Unsweetened oatmeal, bulgur, barley, quinoa, and brown rice. Tortillas made from corn or whole-wheat flour. Meats and other proteins Low-fat (lean) proteins, such as fish, chicken, beans, eggs, and tofu. Dairy Low-fat dairy products, such as skim milk, cheese sticks, and yogurt. Beverages Low-fat or fat-free drinks, such as water, low-fat milk, sugar-free drinks, and small amounts of 100% fruit juice. Seasonings and condiments Ketchup. Mustard. Barbecue sauce. Relish. Low-fat or fat-free mayonnaise. Fats and oils Olive oil or canola oil. Walnuts and almonds. The items listed above may not be a complete list of recommended foods and beverages. Contact a dietitian for more options. What foods are not recommended? Foods that are high in calories or fat. Fried foods. Sweets. Products that are made from refined white flour, including white bread, pastries, white rice, and pasta. The items listed above may not be a complete list of foods and beverages to avoid. Contact a dietitian for more information. Summary  PCOS is a hormonal imbalance that affects a woman's reproductive system.  You can help to manage your PCOS by exercising regularly and eating a healthy, varied diet of vegetables, fruit, whole grains, low-fat (lean) protein, and low-fat dairy products.  Changing what you eat can improve the way that your body uses insulin, help your hormones reach normal levels, and help  you lose weight. This information is not intended to replace advice given to you by your health care provider. Make sure you discuss any questions you have with your health care provider. Document Released: 03/11/2016 Document Revised: 09/22/2017 Document Reviewed: 09/22/2017 Elsevier Interactive Patient Education  2019 [employer]  Edema  Edema is an abnormal buildup of fluids in the body tissues and under the skin. Swelling of the legs, feet, and ankles is a common symptom that becomes more likely as you get older. Swelling is also common in looser tissues, like around the eyes. When the affected area is squeezed, the fluid may move out of that spot and leave a dent for a few moments. This dent is called pitting edema. There are many possible causes of edema. Eating too much salt (sodium) and being on your feet or sitting for a long time can cause edema in your legs, feet, and ankles. Hot weather may make edema worse. Common causes of edema include:  Heart failure.  Liver or kidney disease.  Weak leg blood vessels.  Cancer.  An  injury.  Pregnancy.  Medicines.  Being obese.  Low protein levels in the blood. Edema is usually painless. Your skin may look swollen or shiny. Follow these instructions at home:  Keep the affected body part raised (elevated) above the level of your heart when you are sitting or lying down.  Do not sit still or stand for long periods of time.  Do not wear tight clothing. Do not wear garters on your upper legs.  Exercise your legs to get your circulation going. This helps to move the fluid back into your blood vessels, and it may help the swelling go down.  Wear elastic bandages or support stockings to reduce swelling as told by your health care provider.  Eat a low-salt (low-sodium) diet to reduce fluid as told by your health care provider.  Depending on the cause of your swelling, you may need to limit how much fluid you drink (fluid  restriction).  Take over-the-counter and prescription medicines only as told by your health care provider. Contact a health care provider if:  Your edema does not get better with treatment.  You have heart, liver, or kidney disease and have symptoms of edema.  You have sudden and unexplained weight gain. Get help right away if:  You develop shortness of breath or chest pain.  You cannot breathe when you lie down.  You develop pain, redness, or warmth in the swollen areas.  You have heart, liver, or kidney disease and suddenly get edema.  You have a fever and your symptoms suddenly get worse. Summary  Edema is an abnormal buildup of fluids in the body tissues and under the skin.  Eating too much salt (sodium) and being on your feet or sitting for a long time can cause edema in your legs, feet, and ankles.  Keep the affected body part raised (elevated) above the level of your heart when you are sitting or lying down. This information is not intended to replace advice given to you by your health care provider. Make sure you discuss any questions you have with your health care provider. Document Released: 11/18/2005 Document Revised: 12/21/2016 Document Reviewed: 12/21/2016 Elsevier Interactive Patient Education  2019 [employer]  Preventing Diabetes Mellitus Complications You can take action to prevent or slow down problems that are caused by diabetes (diabetes mellitus). Following your diabetes plan and taking care of yourself can reduce your risk of serious or life-threatening complications. What actions can I take to prevent diabetes complications? Manage your diabetes   Follow instructions from your health care providers about managing your diabetes. Your diabetes may be managed by a team of health care providers who can teach you how to care for yourself and can answer questions that you have.  Educate yourself about your condition so you can make healthy choices about  eating and physical activity.  Check your blood sugar (glucose) levels as often as directed. Your health care provider will help you decide how often to check your blood glucose level depending on your treatment goals and how well you are meeting them.  Ask your health care provider if you should take low-dose aspirin daily and what dose is recommended for you. Taking low-dose aspirin daily is recommended to help prevent cardiovascular disease. Do not use nicotine or tobacco Do not use any products that contain nicotine or tobacco, such as cigarettes and e-cigarettes. If you need help quitting, ask your health care provider. Nicotine raises your risk for diabetes problems. If you quit using nicotine:  You will lower your risk for heart attack, stroke, nerve disease, and kidney disease.  Your cholesterol and blood pressure may improve.  Your blood circulation will improve. Keep your blood pressure under control Your personal target blood pressure is determined based on:  Your age.  Your medicines.  How long you have had diabetes.  Any other medical conditions you have. To control your blood pressure:  Follow instructions from your health care provider about meal planning, exercise, and medicines.  Make sure your health care provider checks your blood pressure at every medical visit.  Monitor your blood pressure at home as told by your health care provider.  Keep your cholesterol under control To control your cholesterol:  Follow instructions from your health care provider about meal planning, exercise, and medicines.  Have your cholesterol checked at least once a year.  You may be prescribed medicine to lower cholesterol (statin). If you are not taking a statin, ask your health care provider if you should be. Controlling your cholesterol may:  Help prevent heart disease and stroke. These are the most common health problems for people with diabetes.  Improve your blood flow.  Schedule and keep yearly physical exams and eye exams Your health care provider will tell you how often you need medical visits depending on your diabetes management plan. Keep all follow-up visits as directed. This is important so possible problems can be identified early and complications can be avoided or treated.  Every visit with your health care provider should include measuring your: ? Weight. ? Blood pressure. ? Blood glucose control.  Your A1c (hemoglobin A1c) level should be checked: ? At least 2 times a year, if you are meeting your treatment goals. ? 4 times a year, if you are not meeting treatment goals or if your treatment goals have changed.  Your blood lipids (lipid profile) should be checked yearly. You should also be checked yearly for protein in your urine (urine microalbumin).  If you have type 1 diabetes, get an eye exam 3-5 years after you are diagnosed, and then once a year after your first exam.  If you have type 2 diabetes, get an eye exam as soon as you are diagnosed, and then once a year after your first exam. Keep your vaccines current It is recommended that you receive:  A flu (influenza) vaccine every year.  A pneumonia (pneumococcal) vaccine and a hepatitis B vaccine. If you are age 43 or older, you may get the pneumonia vaccine as a series of two separate shots. Ask your health care provider which other vaccines may be recommended. Take care of your feet Diabetes may cause you to have poor blood circulation to your legs and feet. Because of this, taking care of your feet is very important. Diabetes can cause:  The skin on the feet to get thinner, break more easily, and heal more slowly.  Nerve damage in your legs and feet, which results in decreased feeling. You may not notice minor injuries that could lead to serious problems. To avoid foot problems:  Check your skin and feet every day for cuts, bruises, redness, blisters, or sores.  Schedule a foot  exam with your health care provider once every year. This exam includes: ? Inspecting of the structure and skin of your feet. ? Checking the pulses and sensation in your feet.  Make sure that your health care provider performs a visual foot exam at every medical visit.  Take care of your teeth People with  poorly controlled diabetes are more likely to have gum (periodontal) disease. Diabetes can make periodontal diseases harder to control. If not treated, periodontal diseases can lead to tooth loss. To prevent this:  Brush your teeth twice a day.  Floss at least once a day.  Visit your dentist 2 times a year. Drink responsibly Limit alcohol intake to no more than 1 drink a day for nonpregnant women and 2 drinks a day for men. One drink equals 12 oz of beer, 5 oz of wine, or 1 oz of hard liquor.  It is important to eat food when you drink alcohol to avoid low blood glucose (hypoglycemia). Avoid alcohol if you:  Have a history of alcohol abuse or dependence.  Are pregnant.  Have liver disease, pancreatitis, advanced neuropathy, or severe hypertriglyceridemia. Lessen stress Living with diabetes can be stressful. When you are experiencing stress, your blood glucose may be affected in two ways:  Stress hormones may cause your blood glucose to rise.  You may be distracted from taking good care of yourself. Be aware of your stress level and make changes to help you manage challenging situations. To lower your stress levels:  Consider joining a support group.  Do planned relaxation or meditation.  Do a hobby that you enjoy.  Maintain healthy relationships.  Exercise regularly.  Work with your health care provider or a mental health professional. Summary  You can take action to prevent or slow down problems that are caused by diabetes (diabetes mellitus). Following your diabetes plan and taking care of yourself can reduce your risk of serious or life-threatening complications.   Follow instructions from your health care providers about managing your diabetes. Your diabetes may be managed by a team of health care providers who can teach you how to care for yourself and can answer questions that you have.  Your health care provider will tell you how often you need medical visits depending on your diabetes management plan. Keep all follow-up visits as directed. This is important so possible problems can be identified early and complications can be avoided or treated. This information is not intended to replace advice given to you by your health care provider. Make sure you discuss any questions you have with your health care provider. Document Released: 08/06/2011 Document Revised: 07/08/2017 Document Reviewed: 08/17/2016 Elsevier Interactive Patient Education  2019 [employer].

## 2019-05-05 NOTE — Progress Notes (Signed)
Patient presents to clinic today to f/u on chronic conditions and establish care.  SUBJECTIVE: PMH: Pt is a 43 yo female with pmh sig for HTN, DM II, HLD, LE edema, fibroids.  Previously seen at [facility].  DM II: -dx'd in 2011 -taking Synjardy 12.04-999 mg 2 tabs in am -was on invokana and glipizide in the past. -fsbs controlled per pt.  Last hgb A1C 7.5% on 04/23/18 -denies hypo or hyperglycemia.  -pt has been a vegan x 4 yrs, though does eat cheese.  HTN: -checking bp at home.  Typically 110 or less/60s-70s -exercising and eating healthy -taking lisinopril-HCTZ 20-25 mg   PCOS -followed by OB/Gyn, Dr. Shawna Clamp -pt advised she cannot have children. -h/o benign ovarian cyst that grew exponentially in size (32 cm), required emergency surgery -on Mivelas OCPs continuously   LE edema, b/l: -chronic -may happen q 3-4 months -pt wears compression socks -will take lasix prn -had negative Cardiology work up.  Has upcoming f/u with Cards   H/o Gastric sleeve: -weighed 330 lbs prior to surgery.  Lost 80 lbs on her own. -had surgery in [place] -endorses complications.  Had a hematoma after a "vein ruptured" -pt recalls waking up in ICU four days after the surgery.  Allergies: Benzoyl Peroxide- burns skin.  Occurred with Proactive use.  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  Pt is currently in school for her Ph.D.  She will graduate next month.  Pt does not have any children.  Pt denies tobacco and drug use.  Family Medical hx: Mom-desc 2011, HTN, DM I, renal tumors, hysterectomy, ovarian cyst Dad-HTN MGF- heart transplant, prostate ca, DM II, HTN MGM- dementia, HTN  Health Maintenance: Dental -- Vision --wears glasses.  Endorse recent eye exam Immunizations -- Colonoscopy -- n/a Mammogram --Jan 2019 PAP --  Scheduled in a few  months.  Last one May/June 2019. Bone Density -- n/a  Past Medical History:  Diagnosis Date  . Chest pain    a. 09/2015 Ex MV: EF >55%, no ischemia/infarct.  . Diabetes mellitus without complication ([place])    a. 11/20/2015 A1c 11.5;  b.  noncompliant w/ diabetes meds, glucose checks, f/u, etc.  . Dyspnea on exertion    a. 09/2015 Echo: Ef 60-65%, no rwma, mild AI, mildly dil LA.  . Essential hypertension   . Hyperlipidemia   . Morbid obesity ([place])   . Mucinous cystadenoma of ovary    Hx of   . Noncompliance     Past Surgical History:  Procedure Laterality Date  . ABDOMINAL HYSTERECTOMY     partial hyst  . MOUTH SURGERY    . OOPHORECTOMY  07/2006   Rt; large benign ovarian tumor    Current Outpatient Medications on File Prior to Visit  Medication Sig Dispense Refill  . aspirin 81 MG chewable tablet Chew 81 mg by mouth daily.    Marland Kitchen BAYER MICROLET LANCETS lancets Use to test blood sugar 2 time daily as instructed. 100 each 5  . Blood Glucose Monitoring Suppl (BAYER CONTOUR NEXT MONITOR) w/Device KIT Use to test blood sugar daily. 1 kit 0  . glucose blood (BAYER CONTOUR NEXT TEST) test strip Use to test blood sugar 2 times daily as instructed. 100 each 5  . ibuprofen (ADVIL,MOTRIN) 600 MG tablet Take 600 mg by mouth every 6 (six) hours as needed for pain.    Marland Kitchen  ibuprofen (ADVIL,MOTRIN) 600 MG tablet Take 1 tablet (600 mg total) by mouth every 8 (eight) hours as needed. 15 tablet 0  . KLOR-CON M10 10 MEQ tablet Take 10 mEq by mouth daily.     Marland Kitchen lisinopril-hydrochlorothiazide (PRINZIDE,ZESTORETIC) 20-25 MG per tablet Take 1 tablet by mouth daily.     No current facility-administered medications on file prior to visit.     Allergies  Allergen Reactions  . Benzoyl Peroxide Other (See Comments)    Burns     Family History  Problem Relation Age of Onset  . Heart disease Mother 54       heart attack  . Hypertension Father   . Diabetes Father        Type II  . Cancer Maternal  Grandfather        prostate cancer    Social History   Socioeconomic History  . Marital status: Single    Spouse name: Not on file  . Number of children: 0  . Years of education: Not on file  . Highest education level: Not on file  Occupational History    Employer: [employer]  . Financial resource strain: Not on file  . Food insecurity:    Worry: Not on file    Inability: Not on file  . Transportation needs:    Medical: Not on file    Non-medical: Not on file  Tobacco Use  . Smoking status: Never Smoker  . Smokeless tobacco: Never Used  Substance and Sexual Activity  . Alcohol use: No  . Drug use: No  . Sexual activity: Yes    Partners: Male  Lifestyle  . Physical activity:    Days per week: Not on file    Minutes per session: Not on file  . Stress: Not on file  Relationships  . Social connections:    Talks on phone: Not on file    Gets together: Not on file    Attends religious service: Not on file    Active member of club or organization: Not on file    Attends meetings of clubs or organizations: Not on file    Relationship status: Not on file  . Intimate partner violence:    Fear of current or ex partner: Not on file    Emotionally abused: Not on file    Physically abused: Not on file    Forced sexual activity: Not on file  Other Topics Concern  . 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 Avg. Glucose: 298      Regular exercise: no   Caffeine use: occassionally                   ROS General: Denies fever, chills, night sweats, changes in weight, changes in appetite HEENT: Denies headaches, ear pain, changes in vision, rhinorrhea, sore throat CV: Denies CP, palpitations, SOB, orthopnea Pulm: Denies SOB, cough, wheezing GI: Denies abdominal pain,  nausea, vomiting, diarrhea, constipation GU: Denies dysuria, hematuria, frequency, vaginal discharge Msk: Denies muscle cramps, joint pains Neuro: Denies weakness, numbness, tingling Skin: Denies rashes, bruising Psych: Denies depression, anxiety, hallucinations  BP 102/64 (BP Location: Left Arm, Patient Position: Sitting, Cuff Size: Normal)   Pulse 80   Temp 98.3  F (36.8 C) (Oral)   Ht 5' 6"  (1.676 m)   Wt 184 lb (83.5 kg)   SpO2 98%   BMI 29.70 kg/m   Physical Exam Gen. Pleasant, well developed, well-nourished, in NAD HEENT - [place]/AT, PERRL, no scleral icterus, no nasal drainage, pharynx without erythema or exudate. Lungs: no use of accessory muscles, CTAB, no wheezes, rales or rhonchi Cardiovascular: RRR, No r/g/m, no peripheral edema Musculoskeletal: No deformities, moves all four extremities, no cyanosis or clubbing, normal tone Neuro:  A&Ox3, CN II-XII intact, normal gait Skin:  Warm, dry, intact, no lesions  No results found for this or any previous visit (from the past 2160 hour(s)).  Assessment/Plan: Controlled type 2 diabetes mellitus with complication, without long-term current use of insulin (HCC)  -hgb A1C from 04/27/18 was 7.5%. -hgb A1C this visit 7.4% -discussed lifestyle modifications - Plan: Empagliflozin-metFORMIN HCl ER (SYNJARDY XR) 12.04-999 MG TB24, POCT glycosylated hemoglobin (Hb A1C)  PCOS (polycystic ovarian syndrome) -on metformin combo pill for DM -continue OCPs Mivelas 24 Fe -continue f/u with OB/Gyn, Dr. Vicki Mallet  Essential hypertension -controlled -continue lisinopril-HCTZ 20-25 mg -continue lifestyle modifications -continue checking bp at home  Bilateral lower extremity edema  -continue lasix prn, discussed taking Kdur when taking using lasix - Plan: furosemide (LASIX) 20 MG tablet  Encounter to establish care -We reviewed the PMH, PSH, FH, SH, Meds and Allergies. -We provided refills for any medications we will prescribe as needed.  -We addressed current concerns per orders and patient instructions. -Rrecords for pertinent exams, studies, vaccines and notes from previous providers reviewed.  CMP, normal from 04/27/18, Pap  Normal from 04/30/18 -We have advised patient to follow up per instructions below.    F/u prn  Grier Mitts, MD

## 2019-05-06 ENCOUNTER — Other Ambulatory Visit: Payer: Self-pay

## 2019-05-06 MED ORDER — GLUCOSE BLOOD VI STRP
ORAL_STRIP | 5 refills | Status: DC
Start: 2019-05-06 — End: 2020-05-29

## 2019-05-22 DIAGNOSIS — L253 Unspecified contact dermatitis due to other chemical products: Secondary | ICD-10-CM | POA: Diagnosis not present

## 2019-05-26 ENCOUNTER — Telehealth: Payer: Self-pay | Admitting: Family Medicine

## 2019-07-01 ENCOUNTER — Encounter: Payer: Self-pay | Admitting: Family Medicine

## 2019-07-02 ENCOUNTER — Encounter: Payer: Self-pay | Admitting: Family Medicine

## 2019-07-05 ENCOUNTER — Encounter: Payer: Self-pay | Admitting: Family Medicine

## 2019-07-06 ENCOUNTER — Other Ambulatory Visit: Payer: Self-pay | Admitting: *Deleted

## 2019-07-06 DIAGNOSIS — I10 Essential (primary) hypertension: Secondary | ICD-10-CM

## 2019-07-06 MED ORDER — LISINOPRIL-HYDROCHLOROTHIAZIDE 20-25 MG PO TABS: 1.0000 | ORAL_TABLET | Freq: Every day | ORAL | 1 refills | Status: DC

## 2019-08-27 ENCOUNTER — Encounter: Payer: Self-pay | Admitting: Family Medicine

## 2019-09-08 ENCOUNTER — Encounter: Payer: Self-pay | Admitting: Family Medicine

## 2019-09-08 ENCOUNTER — Other Ambulatory Visit: Payer: Self-pay

## 2019-09-08 ENCOUNTER — Ambulatory Visit (INDEPENDENT_AMBULATORY_CARE_PROVIDER_SITE_OTHER): Payer: Self-pay

## 2019-09-08 DIAGNOSIS — Z111 Encounter for screening for respiratory tuberculosis: Secondary | ICD-10-CM

## 2019-09-08 NOTE — Progress Notes (Signed)
Per orders of Dr. Grier Mitts, injection of Tuberculin  given by Wyvonne Lenz. Patient tolerated injection well.

## 2019-09-10 ENCOUNTER — Encounter: Payer: Self-pay | Admitting: Family Medicine

## 2019-09-10 ENCOUNTER — Telehealth: Payer: Self-pay | Admitting: *Deleted

## 2019-09-10 DIAGNOSIS — Z0279 Encounter for issue of other medical certificate: Secondary | ICD-10-CM

## 2019-09-10 LAB — TB SKIN TEST
Induration: 0 mm
TB Skin Test: NEGATIVE

## 2019-09-10 NOTE — Telephone Encounter (Signed)
Patient picked up forms  Paid $29 form fee

## 2019-09-10 NOTE — Telephone Encounter (Signed)
Called patient. No answer. LVM that paper work is ready for pick up and there is a $29 fee. Form placed up front

## 2019-09-20 ENCOUNTER — Telehealth: Payer: Self-pay | Admitting: *Deleted

## 2019-09-20 ENCOUNTER — Telehealth: Payer: Self-pay

## 2019-09-20 NOTE — Telephone Encounter (Signed)
  Pt already picked up the form from the office this morning      Copied from [place] [phone]. Topic: General - Inquiry >> Sep 17, 2019  9:29 AM Scherrie Gerlach wrote: Reason for CRM: pt states her phone rang and no one left message. Was wondering if it was to let her know form ready to be picked up. >> Sep 17, 2019  5:09 PM Selinda Flavin B, NT wrote: Patient calling and states that no one has reached back out to her. States that if it is regarding the form that may be ready for pick up, please have someone call her asap. States that it is the last information that her new job is needing and it was supposed to be completed and to them on 09/10/2019. Please advise.

## 2019-09-20 NOTE — Telephone Encounter (Signed)
Pt picked up the Health Assessment Form from the office this morning

## 2019-09-20 NOTE — Telephone Encounter (Signed)
Copied from [place] [id number]. Topic: General - Inquiry >> Sep 17, 2019  9:29 AM Scherrie Gerlach wrote: Reason for CRM: pt states her phone rang and no one left message. Was wondering if it was to let her know form ready to be picked up. >> Sep 17, 2019  5:09 PM Selinda Flavin B, NT wrote: Patient calling and states that no one has reached back out to her. States that if it is regarding the form that may be ready for pick up, please have someone call her asap. States that it is the last information that her new job is needing and it was supposed to be completed and to them on 09/10/2019. Please advise.

## 2019-09-20 NOTE — Telephone Encounter (Signed)
Spoke with pt advised that our office has been  trying to get in contact with pt to get a fax number to sent her health screening form. Spoke with pt states that she will pass by the office this morning to pick up the paperwork. Pt just picked up paperwork from the office this morning 09/20/2019

## 2019-10-06 ENCOUNTER — Other Ambulatory Visit: Payer: Self-pay | Admitting: Family Medicine

## 2019-10-06 DIAGNOSIS — I10 Essential (primary) hypertension: Secondary | ICD-10-CM

## 2019-10-08 NOTE — Telephone Encounter (Signed)
Please see rx refill 

## 2019-10-08 NOTE — Telephone Encounter (Signed)
Patient is checking status on medication refill. Patient has no more medication Please send to [facility], [place] [phone] (Phone) [phone] (Fax)   Call back [phone]

## 2019-10-31 DIAGNOSIS — Z03818 Encounter for observation for suspected exposure to other biological agents ruled out: Secondary | ICD-10-CM | POA: Diagnosis not present

## 2019-11-03 DIAGNOSIS — Z03818 Encounter for observation for suspected exposure to other biological agents ruled out: Secondary | ICD-10-CM | POA: Diagnosis not present

## 2019-11-03 DIAGNOSIS — Z20828 Contact with and (suspected) exposure to other viral communicable diseases: Secondary | ICD-10-CM | POA: Diagnosis not present

## 2019-11-08 DIAGNOSIS — Z6829 Body mass index (BMI) 29.0-29.9, adult: Secondary | ICD-10-CM | POA: Diagnosis not present

## 2019-11-08 DIAGNOSIS — Z20828 Contact with and (suspected) exposure to other viral communicable diseases: Secondary | ICD-10-CM | POA: Diagnosis not present

## 2019-11-24 DIAGNOSIS — Z6829 Body mass index (BMI) 29.0-29.9, adult: Secondary | ICD-10-CM | POA: Diagnosis not present

## 2019-11-24 DIAGNOSIS — Z20828 Contact with and (suspected) exposure to other viral communicable diseases: Secondary | ICD-10-CM | POA: Diagnosis not present

## 2019-11-29 ENCOUNTER — Encounter: Payer: Self-pay | Admitting: Family Medicine

## 2019-11-29 ENCOUNTER — Telehealth (INDEPENDENT_AMBULATORY_CARE_PROVIDER_SITE_OTHER): Payer: Self-pay | Admitting: Family Medicine

## 2019-11-29 DIAGNOSIS — M25512 Pain in left shoulder: Secondary | ICD-10-CM

## 2019-11-29 NOTE — Progress Notes (Signed)
Virtual Visit via Video Note  I connected with Brenda Acosta on 11/29/19 at  4:00 PM EST by a video enabled telemedicine application 2/2 [id number] pandemic and verified that I am speaking with the correct person using two identifiers.  Location patient: home Location provider:work or home office Persons participating in the virtual visit: patient, provider  I discussed the limitations of evaluation and management by telemedicine and the availability of in person appointments. The patient expressed understanding and agreed to proceed.   HPI: Pt is a 43 yo female with pmh sig for HTN, DM II, HLD.  Pt had bursitis in her R shoulder 3-4 yrs ago caused by sleeping on her R side.  She switched to sleeping on her L side, now having similar pain in L shoulder.  Pt doing message therapy with hot stones and taking warm showers.  Has not done anything else for the pain.  Pain noted as deep. The pain can be sharp with certain movements.  Still able to sleep on L side.  Pt denies popping, clicking, tearing, or injury.  Able to reach behind back to day with minimal pain.  ROS: See pertinent positives and negatives per HPI.  Past Medical History:  Diagnosis Date  . Chest pain    a. 09/2015 Ex MV: EF >55%, no ischemia/infarct.  . Diabetes mellitus without complication ([place])    a. 11/20/2015 A1c 11.5;  b.  noncompliant w/ diabetes meds, glucose checks, f/u, etc.  . Dyspnea on exertion    a. 09/2015 Echo: Ef 60-65%, no rwma, mild AI, mildly dil LA.  . Essential hypertension   . Hyperlipidemia   . Morbid obesity ([place])   . Mucinous cystadenoma of ovary    Hx of   . Noncompliance     Past Surgical History:  Procedure Laterality Date  . ABDOMINAL HYSTERECTOMY     partial hyst  . MOUTH SURGERY    . OOPHORECTOMY  07/2006   Rt; large benign ovarian tumor    Family History  Problem Relation Age of Onset  . Heart disease Mother 15       heart attack  . Hypertension Father   . Diabetes Father         Type II  . Cancer Maternal Grandfather        prostate cancer    Current Outpatient Medications:  .  aspirin 81 MG chewable tablet, Chew 81 mg by mouth daily., Disp: , Rfl:  .  BAYER MICROLET LANCETS lancets, Use to test blood sugar 2 time daily as instructed., Disp: 100 each, Rfl: 5 .  Blood Glucose Monitoring Suppl (BAYER CONTOUR NEXT MONITOR) w/Device KIT, Use to test blood sugar daily., Disp: 1 kit, Rfl: 0 .  Empagliflozin-metFORMIN HCl ER (SYNJARDY XR) 12.04-999 MG TB24, Take 2 tablets by mouth daily with breakfast., Disp: 180 tablet, Rfl: 3 .  furosemide (LASIX) 20 MG tablet, Take one tab daily prn for lower extremity edema, Disp: 30 tablet, Rfl: 3 .  glucose blood (BAYER CONTOUR NEXT TEST) test strip, Use to test blood sugar 2 times daily as instructed., Disp: 100 each, Rfl: 5 .  ibuprofen (ADVIL,MOTRIN) 600 MG tablet, Take 600 mg by mouth every 6 (six) hours as needed for pain., Disp: , Rfl:  .  ibuprofen (ADVIL,MOTRIN) 600 MG tablet, Take 1 tablet (600 mg total) by mouth every 8 (eight) hours as needed., Disp: 15 tablet, Rfl: 0 .  KLOR-CON M10 10 MEQ tablet, Take 10 mEq by mouth daily. , Disp: ,  Rfl:  .  lisinopril-hydrochlorothiazide (ZESTORETIC) 20-25 MG tablet, Take 1 tablet by mouth once daily, Disp: 90 tablet, Rfl: 0  EXAM:  VITALS per patient if applicable: RR between 12-19 bpm  GENERAL: alert, oriented, appears well and in no acute distress  HEENT: atraumatic, conjunctiva clear, no obvious abnormalities on inspection of external nose and ears  NECK: normal movements of the head and neck  LUNGS: on inspection no signs of respiratory distress, breathing rate appears normal, no obvious gross SOB, gasping or wheezing  CV: no obvious cyanosis  MS: FROM in b/l UEs.  moves all visible extremities without noticeable abnormality  PSYCH/NEURO: pleasant and cooperative, no obvious depression or anxiety, speech and thought processing grossly intact  ASSESSMENT AND  PLAN:  Discussed the following assessment and plan:  Acute pain of left shoulder  -pain possibly 2/2 bursitis, arthritis, impingement, muscle strain. -discussed heat, massage, NSAIDs or arthritis strength tylenol, OTC analgesic rub BID, stretching/ROM exercises. -advised to try sleeping in a different or modified position -for continued symptoms consider short course of prednisone, imaging, and PT.  F/u prn in 2 wk  I discussed the assessment and treatment plan with the patient. The patient was provided an opportunity to ask questions and all were answered. The patient agreed with the plan and demonstrated an understanding of the instructions.   The patient was advised to call back or seek an in-person evaluation if the symptoms worsen or if the condition fails to improve as anticipated.   [name], MD

## 2019-12-22 ENCOUNTER — Encounter: Payer: Self-pay | Admitting: Family Medicine

## 2019-12-24 ENCOUNTER — Other Ambulatory Visit: Payer: Self-pay | Admitting: Family Medicine

## 2019-12-24 DIAGNOSIS — I10 Essential (primary) hypertension: Secondary | ICD-10-CM

## 2019-12-24 DIAGNOSIS — M25512 Pain in left shoulder: Secondary | ICD-10-CM

## 2019-12-24 MED ORDER — LISINOPRIL-HYDROCHLOROTHIAZIDE 20-25 MG PO TABS: 1.0000 | ORAL_TABLET | Freq: Every day | ORAL | 0 refills | Status: DC

## 2019-12-27 ENCOUNTER — Ambulatory Visit (INDEPENDENT_AMBULATORY_CARE_PROVIDER_SITE_OTHER)
Admission: RE | Admit: 2019-12-27 | Discharge: 2019-12-27 | Disposition: A | Discharge status: Home / Self Care | Payer: Medicaid Other | Source: Ambulatory Visit | Attending: Family Medicine | Admitting: Family Medicine

## 2019-12-27 ENCOUNTER — Other Ambulatory Visit: Payer: Self-pay

## 2019-12-27 DIAGNOSIS — M25512 Pain in left shoulder: Secondary | ICD-10-CM

## 2019-12-30 ENCOUNTER — Other Ambulatory Visit: Payer: Self-pay

## 2019-12-30 DIAGNOSIS — M25512 Pain in left shoulder: Secondary | ICD-10-CM

## 2019-12-31 ENCOUNTER — Other Ambulatory Visit: Payer: Self-pay

## 2019-12-31 ENCOUNTER — Other Ambulatory Visit: Payer: Self-pay | Admitting: Family Medicine

## 2019-12-31 MED ORDER — PREDNISONE 10 MG PO TABS: ORAL_TABLET | ORAL | 0 refills | Status: DC

## 2020-01-05 ENCOUNTER — Encounter: Payer: Self-pay | Admitting: Physical Therapy

## 2020-01-05 ENCOUNTER — Ambulatory Visit: Payer: BLUE CROSS/BLUE SHIELD | Attending: Family Medicine | Admitting: Physical Therapy

## 2020-01-05 ENCOUNTER — Other Ambulatory Visit: Payer: Self-pay

## 2020-01-05 DIAGNOSIS — R252 Cramp and spasm: Secondary | ICD-10-CM

## 2020-01-05 DIAGNOSIS — R293 Abnormal posture: Secondary | ICD-10-CM | POA: Insufficient documentation

## 2020-01-05 DIAGNOSIS — M6281 Muscle weakness (generalized): Secondary | ICD-10-CM | POA: Insufficient documentation

## 2020-01-05 DIAGNOSIS — M25512 Pain in left shoulder: Secondary | ICD-10-CM | POA: Insufficient documentation

## 2020-01-05 NOTE — Therapy (Signed)
[facility] [address] [place], [place], [address] Phone: [phone]   Fax:  [phone]  Physical Therapy Evaluation  Patient Details  Name: Brenda Acosta MRN: [id number] Date of Birth: 08/07/1976 Referring Provider (PT): [name], MD   Encounter Date: 01/05/2020  PT End of Session - 01/05/20 0842    Visit Number  1    Date for PT Re-Evaluation  03/01/20    Authorization Type  BCBS    Authorization - Visit Number  1    Authorization - Number of Visits  30    PT Start Time  0845    PT Stop Time  0930    PT Time Calculation (min)  45 min    Activity Tolerance  Patient tolerated treatment well    Behavior During Therapy  [facility] for tasks assessed/performed       Past Medical History:  Diagnosis Date  . Chest pain    a. 09/2015 Ex MV: EF >55%, no ischemia/infarct.  . Diabetes mellitus without complication ([place])    a. 11/20/2015 A1c 11.5;  b.  noncompliant w/ diabetes meds, glucose checks, f/u, etc.  . Dyspnea on exertion    a. 09/2015 Echo: Ef 60-65%, no rwma, mild AI, mildly dil LA.  . Essential hypertension   . Hyperlipidemia   . Morbid obesity ([place])   . Mucinous cystadenoma of ovary    Hx of   . Noncompliance     Past Surgical History:  Procedure Laterality Date  . ABDOMINAL HYSTERECTOMY     partial hyst  . MOUTH SURGERY    . OOPHORECTOMY  07/2006   Rt; large benign ovarian tumor    There were no vitals filed for this visit.   Subjective Assessment - 01/05/20 0843    Subjective  Lt shoulder pain started Dec 2020 without injury but does not she sleeps on Lt due to history of Rt shoulder pain (no Rt shoulder pain at this time).  She can still sleep on Lt side but takes time to position carefully.  Pt is on day 3 of prednisone pack.  Arm at rest is non-painful but hurts with movement. Pain is not as sharp since starting prednisone and with heat/massage.    Pertinent History  Hx of Rt  shoulder pain in past    Limitations  Lifting;Other (comment);House hold activities   dressing   Diagnostic tests  Lt shoulder xray 12/27/19: Small mineralized density adjacent to the lateral margin of the coracoid process, possibly mineralization at the ligamentous attachment    Patient Stated Goals  be able to fasten bra behind back, raise arm overhead, put on deodorant, sleep through night    Currently in Pain?  Yes    Pain Score  4    with movement   Pain Location  Shoulder    Pain Orientation  Left;Anterior   deep   Pain Descriptors / Indicators  Sharp;Throbbing    Pain Type  Acute pain    Pain Radiating Towards  upper arm    Pain Onset  More than a month ago    Pain Frequency  Intermittent    Aggravating Factors   reaching, laying on Lt, dressing behind back and across body    Pain Relieving Factors  heat, massage, prednisone    Effect of Pain on Daily Activities  dressing, sleep, reaching tasks         [facility] PT Assessment - 01/05/20 0001  Assessment   Medical Diagnosis  M25.512 (ICD-10-CM) - Acute pain of left shoulder    Referring Provider (PT)  [name], MD    Onset Date/Surgical Date  --   Dec 2020   Hand Dominance  Right    Next MD Visit  as needed    Prior Therapy  no, just HEP from MD in past      Precautions   Precautions  None      Restrictions   Weight Bearing Restrictions  No      Balance Screen   Has the patient fallen in the past 6 months  No      [place] residence    Living Arrangements  Alone      Prior Function   Level of Independence  Independent    Vocation  Full time employment    Nurse, 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[employer], dance, 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      Cognition   Overall Cognitive Status  Within Functional Limits for tasks assessed      Observation/Other Assessments   Observations  Lt shoulder slightly elevated compared  to Rt    Focus on Therapeutic Outcomes (FOTO)   43%   28%     ROM / Strength   AROM / PROM / Strength  AROM;Strength;PROM      AROM   Overall AROM Comments  neck and Rt shoulder WNL    AROM Assessment Site  Shoulder    Right/Left Shoulder  Left    Left Shoulder Extension  55 Degrees    Left Shoulder Flexion  120 Degrees   pain with capsular end feel   Left Shoulder ABduction  110 Degrees    Left Shoulder Internal Rotation  --   hand to buttock only, very painful   Left Shoulder External Rotation  55 Degrees      PROM   Overall PROM Comments  capsular end feel on Lt, flexion    PROM Assessment Site  Shoulder    Right/Left Shoulder  Left    Left Shoulder Flexion  125 Degrees      Strength   Overall Strength Comments  scapular stabilizers 4/5, bil shoulder and elbow 5/5 throughout    Strength Assessment Site  Shoulder    Right/Left Shoulder  --      Palpation   Palpation comment  signif tenderness on Lt: long head and short head of bicep, corocoid process, pectorals laterally, less tender but trigger points noted: Lt upper trap, posterior delt, infraspinatus       Special Tests    Special Tests  Rotator Cuff Impingement    Rotator Cuff Impingment tests  Michel Bickers test;Neer impingement test      Neer Impingement test    Findings  Positive    Side  Left      Hawkins-Kennedy test   Findings  Positive    Side  Left                Objective measurements completed on examination: See above findings.              PT Education - 01/05/20 0943    Education Details  Access Code: [email or web address]    Person(s) Educated  Patient    Methods  Explanation;Demonstration;Verbal cues;Handout;Tactile cues    Comprehension  Verbalized understanding;Returned demonstration  PT Short Term Goals - 01/05/20 0945      PT SHORT TERM GOAL #1   Title  Pt will be ind with initial HEP and demo proper neck and scapular posture in sitting for work.    Time  3     Period  Weeks    Status  New    Target Date  01/26/20      PT SHORT TERM GOAL #2   Title  Pt will report at least 20% improvement in Lt shoulder pain.    Time  3    Period  Weeks    Status  New    Target Date  01/26/20      PT SHORT TERM GOAL #3   Title  Pt will achieve shoulder flexion to at least 130 deg to improve reaching tasks.    Time  4    Period  Weeks    Status  New    Target Date  02/02/20        PT Long Term Goals - 01/05/20 0947      PT LONG TERM GOAL #1   Title  Pt will be ind in advanced HEP and understand how to safely progress.    Time  8    Period  Weeks    Status  New    Target Date  03/01/20      PT LONG TERM GOAL #2   Title  Pt will achieve at least 50 deg of Lt shoulder extension and be able to reach bra line with shoulder IR for improved ease with dressing.    Time  8    Period  Weeks    Status  New    Target Date  03/01/20      PT LONG TERM GOAL #3   Title  Pt will report at least 70% pain reduction of Lt shoulder with hobbies including dancing, singing while holding microphone in Lt hand, and directing choir.    Time  8    Period  Weeks    Status  New    Target Date  03/01/20      PT LONG TERM GOAL #4   Title  Pt will reduce FOTO score to </= 28% to demo less limitation with daily tasks.    Baseline  43    Time  8    Period  Weeks    Status  New    Target Date  03/01/20      PT LONG TERM GOAL #5   Title  Pt will report at least 70% improvement in sleep disturbance from Lt shoulder pain.    Time  8    Period  Weeks    Status  New    Target Date  03/01/20             Plan - 01/05/20 0936    Clinical Impression Statement  Pt is a 44yo female with insidious onset of Lt shoulder pain in Dec 2020.  Pain is improving with heat and prednisone pack (Pt on day 3 on eval date).  Pain is worse with reaching behind back for dressing, reaching across body, and overhead Lt UE use.  Pt has history of similar Rt shoulder pain in past which  improved with exercise.  Her strength is 5/5 for bil UEs, 4/5 for scapular stabilizers, and Rt shoulder ROM is WNL.  Cervical ROM is limited in RSB with soft tissue stretch of Lt upper trap; all other cervical  ROM is WNL.  Lt shoulder strength is 5/5 but ROM is signif limited by pain and capsular stiffness into IR and ext > flexion and abduction.  She has + impingement signs for Lt shoulder and + tenderness present in Lt pectorals, short and long heads of bicep.  Trigger points are present in posterior shoulder and Lt upper trap.  FOTO score is 43%.  Pt will benefit from skilled PT to address pain and deficits in ROM and functional use of Lt UE.    Personal Factors and Comorbidities  Comorbidity 1;Other    Comorbidities  DM, Investment banker, corporate, dance, sings with mic in Lt UE    Examination-Activity Limitations  Reach Overhead;Carry;Sleep;Dressing;Lift    Examination-Participation Restrictions  Community Activity;Laundry;Meal Prep;Cleaning;Other   hobbies: choir directing, dance, sing with mic in [place] hand   Stability/Clinical Decision Making  Stable/Uncomplicated    Clinical Decision Making  Low    Rehab Potential  Excellent    PT Frequency  1x / week    PT Duration  8 weeks    PT Treatment/Interventions  ADLs/Self Care Home Management;Cryotherapy;Electrical Stimulation;Iontophoresis 4mg /ml Dexamethasone;Moist Heat;Functional mobility training;Therapeutic exercise;Therapeutic activities;Neuromuscular re-education;Manual techniques;Patient/family education;Taping;Dry needling;Passive range of motion;Joint Manipulations;Spinal Manipulations    PT Next Visit Plan  f/u on HEP, DN Lt upper trap and posterior shoulder, AA/ROM Lt finger ladder/[place] with scapular control cueing, trial eccentric bicep ther ex, ionto #1 Lt bicep tendon if cert is signed    PT Home Exercise Plan  Access Code: [email or web address]    Consulted and Agree with Plan of Care  Patient       Patient will benefit from skilled therapeutic  intervention in order to improve the following deficits and impairments:  Decreased range of motion, Increased fascial restricitons, Increased muscle spasms, Impaired UE functional use, Pain, Hypomobility, Impaired flexibility, Improper body mechanics, Decreased strength, Postural dysfunction  Visit Diagnosis: Acute pain of left shoulder - Plan: PT plan of care cert/re-cert  Muscle weakness (generalized) - Plan: PT plan of care cert/re-cert  Abnormal posture - Plan: PT plan of care cert/re-cert  Cramp and spasm - Plan: PT plan of care cert/re-cert     Problem List Patient Active Problem List   Diagnosis Date Noted  . Dyspnea on exertion   . Chest pain   . Noncompliance   . Morbid obesity ([place])   . Hyperlipidemia   . Diabetes mellitus without complication ([place])   . Chest pain on exertion 08/29/2015  . Bilateral leg edema 08/29/2015  . Type II or unspecified type diabetes mellitus with ophthalmic manifestations, uncontrolled 06/10/2013  . Essential hypertension 06/10/2013  . Other and unspecified hyperlipidemia 06/10/2013  . Mucinous cystadenoma of ovary 06/10/2013    Baruch Merl, PT 01/05/20 9:56 AM   [facility] [address] [place], [place], [address] Phone: [phone]   Fax:  [phone]  Name: NONIA WIETECHA MRN: [id number] Date of Birth: 08-02-1976

## 2020-01-05 NOTE — Patient Instructions (Signed)
Access Code: [email or web address]  URL: [email or web address]  Date: 01/05/2020  Prepared by: Venetia Night Bonnee Zertuche   Exercises  Seated Gentle Upper Trapezius Stretch - 2 sets - 20 hold - 2x daily - 7x weekly  Seated Shoulder Rolls - 10 reps - 2 sets - 2x daily - 7x weekly  Seated Scapular Retraction - 10 reps - 2 sets - 3 hold - 2x daily - 7x weekly  Standing Shoulder External Rotation with Resistance - 10 reps - 2 sets - 2x daily - 7x weekly

## 2020-01-17 ENCOUNTER — Ambulatory Visit: Payer: Self-pay | Admitting: Physical Therapy

## 2020-01-24 ENCOUNTER — Other Ambulatory Visit: Payer: Self-pay

## 2020-01-24 ENCOUNTER — Encounter: Payer: Self-pay | Admitting: Physical Therapy

## 2020-01-24 ENCOUNTER — Ambulatory Visit: Payer: Medicaid Other | Admitting: Physical Therapy

## 2020-01-24 DIAGNOSIS — R252 Cramp and spasm: Secondary | ICD-10-CM

## 2020-01-24 DIAGNOSIS — M6281 Muscle weakness (generalized): Secondary | ICD-10-CM

## 2020-01-24 DIAGNOSIS — R293 Abnormal posture: Secondary | ICD-10-CM

## 2020-01-24 DIAGNOSIS — M25512 Pain in left shoulder: Secondary | ICD-10-CM

## 2020-01-24 NOTE — Patient Instructions (Signed)
Access Code: [email or web address]  URL: [email or web address]  Date: 01/24/2020  Prepared by: Venetia Night Blyss Lugar   Exercises  Seated Gentle Upper Trapezius Stretch - 2 sets - 20 hold - 2x daily - 7x weekly  Seated Shoulder Rolls - 10 reps - 2 sets - 2x daily - 7x weekly  Seated Scapular Retraction - 10 reps - 2 sets - 3 hold - 2x daily - 7x weekly  Standing Shoulder External Rotation with Resistance - 10 reps - 2 sets - 2x daily - 7x weekly  Standing Row with Anchored Resistance - 10 reps - 2 sets - 1x daily - 7x weekly  Shoulder Flexion Wall Slide with Towel - 10 reps - 2 sets - 5 hold - 1x daily - 7x weekly  Patient Education  Trigger Point Dry Needling

## 2020-01-24 NOTE — Therapy (Addendum)
[facility] [address] [place], [place], [address] Phone: [phone]   Fax:  [phone]  Physical Therapy Treatment  Patient Details  Name: Brenda Acosta MRN: [id number] Date of Birth: [birth year] Referring Provider (PT): [name], MD   Encounter Date: 01/24/2020  PT End of Session - 01/24/20 0800    Visit Number  2    Date for PT Re-Evaluation  03/01/20    Authorization Type  BCBS    Authorization - Visit Number  2    Authorization - Number of Visits  30    PT Start Time  0800    PT Stop Time  0845    PT Time Calculation (min)  45 min    Activity Tolerance  Patient tolerated treatment well;Patient limited by pain    Behavior During Therapy  [facility] for tasks assessed/performed       Past Medical History:  Diagnosis Date  . Chest pain    a. 09/2015 Ex MV: EF >55%, no ischemia/infarct.  . Diabetes mellitus without complication ([place])    a. 11/20/2015 A1c 11.5;  b.  noncompliant w/ diabetes meds, glucose checks, f/u, etc.  . Dyspnea on exertion    a. 09/2015 Echo: Ef 60-65%, no rwma, mild AI, mildly dil LA.  . Essential hypertension   . Hyperlipidemia   . Morbid obesity ([place])   . Mucinous cystadenoma of ovary    Hx of   . Noncompliance     Past Surgical History:  Procedure Laterality Date  . ABDOMINAL HYSTERECTOMY     partial hyst  . MOUTH SURGERY    . OOPHORECTOMY  07/2006   Rt; large benign ovarian tumor    There were no vitals filed for this visit.  Subjective Assessment - 01/24/20 0800    Subjective  No change in pain with initial HEP and prednisone pack.  Putting my bra on behind my back and reaching for my nightstand causes a lot of pain.    Pertinent History  Hx of Rt shoulder pain in past    Limitations  Lifting;Other (comment);House hold activities    Diagnostic tests  Lt shoulder xray 12/27/19: Small mineralized density adjacent to the lateral margin of the coracoid process,  possibly mineralization at the ligamentous attachment    Patient Stated Goals  be able to fasten bra behind back, raise arm overhead, put on deodorant, sleep through night    Currently in Pain?  No/denies    Pain Score  --   gets to 2/95 with certain motion   Pain Location  Shoulder    Pain Orientation  Left;Anterior    Pain Descriptors / Indicators  Sharp;Throbbing    Pain Onset  More than a month ago    Pain Frequency  Intermittent    Aggravating Factors   reaching behind back for dressing, reaching out to side    Effect of Pain on Daily Activities  dressing, sleep, reaching                       [facility] Adult PT Treatment/Exercise - 01/24/20 0001      Exercises   Exercises  Shoulder      Shoulder Exercises: Standing   Flexion  AAROM;10 reps    Flexion Limitations  wall slides Lt shoulder with shoulder blade control cueing by PT, skewed toward scaption for pain (HEP)    Row  Strengthening;Both;10 reps;Theraband    Theraband Level (Shoulder Row)  Level 2 (Red)    Row Limitations  HEP      Modalities   Modalities  Iontophoresis;Moist Heat      Moist Heat Therapy   Number Minutes Moist Heat  8 Minutes    Moist Heat Location  Shoulder   Left     Iontophoresis   Type of Iontophoresis  Dexamethasone    Location  Lt bicep tendon    Dose  19m    Time  4-6 hour wear       Trigger Point Dry Needling - 01/24/20 0001    Consent Given?  Yes    Education Handout Provided  Yes    Muscles Treated Head and Neck  Upper trapezius    Muscles Treated Upper Quadrant  Deltoid;Infraspinatus    Dry Needling Comments  Lt only    Upper Trapezius Response  Twitch reponse elicited;Palpable increased muscle length    Infraspinatus Response  Twitch response elicited;Palpable increased muscle length    Deltoid Response  Twitch response elicited;Palpable increased muscle length           PT Education - 01/24/20 0842    Education Details  Access Code: [email or web address]    Person(s)  Educated  Patient    Methods  Explanation;Demonstration;Verbal cues;Handout    Comprehension  Verbalized understanding;Returned demonstration       PT Short Term Goals - 01/24/20 1039      PT SHORT TERM GOAL #1   Title  Pt will be ind with initial HEP and demo proper neck and scapular posture in sitting for work.    Status  On-going      PT SHORT TERM GOAL #2   Title  Pt will report at least 20% improvement in Lt shoulder pain.    Status  On-going      PT SHORT TERM GOAL #3   Title  Pt will achieve shoulder flexion to at least 130 deg to improve reaching tasks.    Status  On-going        PT Long Term Goals - 01/05/20 0947      PT LONG TERM GOAL #1   Title  Pt will be ind in advanced HEP and understand how to safely progress.    Time  8    Period  Weeks    Status  New    Target Date  03/01/20      PT LONG TERM GOAL #2   Title  Pt will achieve at least 50 deg of Lt shoulder extension and be able to reach bra line with shoulder IR for improved ease with dressing.    Time  8    Period  Weeks    Status  New    Target Date  03/01/20      PT LONG TERM GOAL #3   Title  Pt will report at least 70% pain reduction of Lt shoulder with hobbies including dancing, singing while holding microphone in Lt hand, and directing choir.    Time  8    Period  Weeks    Status  New    Target Date  03/01/20      PT LONG TERM GOAL #4   Title  Pt will reduce FOTO score to </= 28% to demo less limitation with daily tasks.    Baseline  43    Time  8    Period  Weeks    Status  New    Target Date  03/01/20  PT LONG TERM GOAL #5   Title  Pt will report at least 70% improvement in sleep disturbance from Lt shoulder pain.    Time  8    Period  Weeks    Status  New    Target Date  03/01/20            Plan - 01/24/20 1035    Clinical Impression Statement  Pt with ongoing intermittent pain that is unchanged since evaluation.  Pain is worse with overhead, across body, and behind  back which affects ADLs including dressing.  Sleep continues to be limited by pain.  PT performed DN to trigger points present in Lt upper trap, post deltoid and infraspinatus with signif twitch and release of each muscle group.  She was unable to tol pulleys but tolerated Lt shoulder wall slides for flexion/scaption and low row with red band so PT added these to HEP.  Pt with improving postural awareness in sitting at work although was overcorrecting so PT cued proper form.  PT applied heat for DN soreness and place ionto patch #1 along Lt bicep tendon due to signif tenderness still present there.  Pt will continue to benefit from skilled PT with ongoing assessemnt of response to interventions.    Comorbidities  DM, Investment banker, corporate, dance, sings with mic in Lt UE    Rehab Potential  Excellent    PT Frequency  1x / week    PT Duration  8 weeks    PT Treatment/Interventions  ADLs/Self Care Home Management;Cryotherapy;Electrical Stimulation;Iontophoresis 48m/ml Dexamethasone;Moist Heat;Functional mobility training;Therapeutic exercise;Therapeutic activities;Neuromuscular re-education;Manual techniques;Patient/family education;Taping;Dry needling;Passive range of motion;Joint Manipulations;Spinal Manipulations    PT Next Visit Plan  f/u on DN, ionto, heat, HEP, UBE, Lt shoulder mobs (GH distraction and mobs), AA/ROM as tol, review row and ER red band    PT Home Exercise Plan  Access Code: [email or web address]    Consulted and Agree with Plan of Care  Patient       Patient will benefit from skilled therapeutic intervention in order to improve the following deficits and impairments:     Visit Diagnosis: Acute pain of left shoulder  Muscle weakness (generalized)  Abnormal posture  Cramp and spasm     Problem List Patient Active Problem List   Diagnosis Date Noted  . Dyspnea on exertion   . Chest pain   . Noncompliance   . Morbid obesity ([place]   . Hyperlipidemia   . Diabetes mellitus without  complication ([place]   . Chest pain on exertion 08/29/2015  . Bilateral leg edema 08/29/2015  . Type II or unspecified type diabetes mellitus with ophthalmic manifestations, uncontrolled 06/10/2013  . Essential hypertension 06/10/2013  . Other and unspecified hyperlipidemia 06/10/2013  . Mucinous cystadenoma of ovary 06/10/2013    PHYSICAL THERAPY DISCHARGE SUMMARY  Visits from Start of Care: 2  Current functional level related to goals / functional outcomes: See above     Remaining deficits: See above   Education / Equipment: HEP Plan: Patient agrees to discharge.  Patient goals were partially met. Patient is being discharged due to not returning since the last visit.  ?????         JBaruch Merl PT 05/08/20 10:55 AM   [facility] [address] [place] [address]Phone: [phone]  Fax:  [phone] Name: MCALLIA SWIMMRN: [id number] of Birth: 6October 05, 1977

## 2020-01-27 ENCOUNTER — Other Ambulatory Visit: Payer: Self-pay

## 2020-01-27 ENCOUNTER — Ambulatory Visit: Payer: Medicaid Other | Attending: Family

## 2020-01-27 DIAGNOSIS — Z23 Encounter for immunization: Secondary | ICD-10-CM | POA: Insufficient documentation

## 2020-01-27 NOTE — Progress Notes (Signed)
   Covid-19 Vaccination Clinic  Name:  SAIMA PESCADOR    MRN: [id number] DOB: November 24, 1976  01/27/2020  Ms. Gura was observed post Covid-19 immunization for 15 minutes without incidence. She was provided with Vaccine Information Sheet and instruction to access the V-Safe system.   Ms. Shilling was instructed to call 911 with any severe reactions post vaccine: Marland Kitchen Difficulty breathing  . Swelling of your face and throat  . A fast heartbeat  . A bad rash all over your body  . Dizziness and weakness    Immunizations Administered    Name Date Dose VIS Date Route   Moderna COVID-19 Vaccine 01/27/2020 11:41 AM 0.5 mL 11/02/2019 Intramuscular   Manufacturer: Moderna   Lot: [id number]   [id number]

## 2020-01-31 ENCOUNTER — Telehealth: Payer: Self-pay | Admitting: Physical Therapy

## 2020-01-31 ENCOUNTER — Ambulatory Visit: Payer: Medicaid Other | Attending: Family Medicine | Admitting: Physical Therapy

## 2020-01-31 NOTE — Telephone Encounter (Signed)
LM for patient regarding missed appointment on 01/31/20.  Asked Pt to call us back to confirm future appointments.  Johanna Beuhring, PT 01/31/20 8:32 AM

## 2020-02-08 ENCOUNTER — Encounter: Payer: BLUE CROSS/BLUE SHIELD | Admitting: Physical Therapy

## 2020-02-09 ENCOUNTER — Encounter: Payer: Self-pay | Admitting: Family Medicine

## 2020-02-16 ENCOUNTER — Encounter: Payer: BLUE CROSS/BLUE SHIELD | Admitting: Physical Therapy

## 2020-02-21 ENCOUNTER — Encounter: Payer: BLUE CROSS/BLUE SHIELD | Admitting: Physical Therapy

## 2020-02-28 ENCOUNTER — Encounter: Payer: BLUE CROSS/BLUE SHIELD | Admitting: Physical Therapy

## 2020-02-29 ENCOUNTER — Ambulatory Visit: Payer: Medicaid Other | Attending: Family

## 2020-02-29 DIAGNOSIS — Z23 Encounter for immunization: Secondary | ICD-10-CM

## 2020-02-29 NOTE — Progress Notes (Signed)
   Covid-19 Vaccination Clinic  Name:  Brenda Acosta    MRN: [id number] DOB: 1976/04/25  02/29/2020  [name] was observed post Covid-19 immunization for 15 minutes without incident. She was provided with Vaccine Information Sheet and instruction to access the V-Safe system.   [name] was instructed to call 911 with any severe reactions post vaccine: Marland Kitchen Difficulty breathing  . Swelling of face and throat  . A fast heartbeat  . A bad rash all over body  . Dizziness and weakness   Immunizations Administered    Name Date Dose VIS Date Route   Moderna COVID-19 Vaccine 02/29/2020  9:02 AM 0.5 mL 11/02/2019 Intramuscular   Manufacturer: Moderna   [id number]   [id number]

## 2020-03-02 ENCOUNTER — Ambulatory Visit: Payer: Medicaid Other | Admitting: Family Medicine

## 2020-03-08 ENCOUNTER — Telehealth (INDEPENDENT_AMBULATORY_CARE_PROVIDER_SITE_OTHER): Payer: Medicaid Other | Admitting: Family Medicine

## 2020-03-08 ENCOUNTER — Encounter: Payer: Self-pay | Admitting: Family Medicine

## 2020-03-08 VITALS — Wt 193.0 lb

## 2020-03-08 DIAGNOSIS — R635 Abnormal weight gain: Secondary | ICD-10-CM

## 2020-03-08 NOTE — Progress Notes (Signed)
Virtual Visit via Video Note  I connected with Brenda Acosta on 03/08/20 at  1:00 PM EDT by a video enabled telemedicine application 2/2 [id number] pandemic and verified that I am speaking with the correct person using two identifiers.  Location patient: home Location provider:work or home office Persons participating in the virtual visit: patient, provider  I discussed the limitations of evaluation and management by telemedicine and the availability of in person appointments. The patient expressed understanding and agreed to proceed.   HPI: Pt is a 44 yo female with pmh sig HLD, DM II, HTN.  Pt is trying to lose weight.  States her goal wt is 165 as the most she weighed was 330 lbs.  In April 2016 pt went vegan.  Has since lost 100 lbs.  Pt having difficulty losing the last 20-30 lbs.  Cut down on carbs and sugar, doing intermittent fasting (eats between 11am-7pm).  Drinking shakes with protein, eating salad, vegetables and fruit.  Probably eating [phone] calories per day.  At times has dinner at 8 pm then goes to bed at 10 pm.  Pt got motivated to be healthier overall after her mother died at age 39.   Pt does a 15 min mile every day.  ROS: See pertinent positives and negatives per HPI.  Past Medical History:  Diagnosis Date  . Chest pain    a. 09/2015 Ex MV: EF >55%, no ischemia/infarct.  . Diabetes mellitus without complication ([place])    a. 11/20/2015 A1c 11.5;  b.  noncompliant w/ diabetes meds, glucose checks, f/u, etc.  . Dyspnea on exertion    a. 09/2015 Echo: Ef 60-65%, no rwma, mild AI, mildly dil LA.  . Essential hypertension   . Hyperlipidemia   . Morbid obesity ([place])   . Mucinous cystadenoma of ovary    Hx of   . Noncompliance     Past Surgical History:  Procedure Laterality Date  . ABDOMINAL HYSTERECTOMY     partial hyst  . MOUTH SURGERY    . OOPHORECTOMY  07/2006   Rt; large benign ovarian tumor    Family History  Problem Relation Age of Onset  . Heart disease  Mother 52       heart attack  . Hypertension Father   . Diabetes Father        Type II  . Cancer Maternal Grandfather        prostate cancer      Current Outpatient Medications:  .  BAYER MICROLET LANCETS lancets, Use to test blood sugar 2 time daily as instructed., Disp: 100 each, Rfl: 5 .  Blood Glucose Monitoring Suppl (BAYER CONTOUR NEXT MONITOR) w/Device KIT, Use to test blood sugar daily., Disp: 1 kit, Rfl: 0 .  Empagliflozin-metFORMIN HCl ER (SYNJARDY XR) 12.04-999 MG TB24, Take 2 tablets by mouth daily with breakfast., Disp: 180 tablet, Rfl: 3 .  furosemide (LASIX) 20 MG tablet, Take one tab daily prn for lower extremity edema, Disp: 30 tablet, Rfl: 3 .  glucose blood (BAYER CONTOUR NEXT TEST) test strip, Use to test blood sugar 2 times daily as instructed., Disp: 100 each, Rfl: 5 .  ibuprofen (ADVIL,MOTRIN) 600 MG tablet, Take 600 mg by mouth every 6 (six) hours as needed for pain., Disp: , Rfl:  .  ibuprofen (ADVIL,MOTRIN) 600 MG tablet, Take 1 tablet (600 mg total) by mouth every 8 (eight) hours as needed., Disp: 15 tablet, Rfl: 0 .  lisinopril-hydrochlorothiazide (ZESTORETIC) 20-25 MG tablet, Take 1 tablet by mouth  daily., Disp: 90 tablet, Rfl: 0  EXAM:  VITALS per patient if applicable:Wt 193 lbs.  RR between 12-20 bpm  GENERAL: alert, oriented, appears well and in no acute distress  HEENT: atraumatic, conjunctiva clear, no obvious abnormalities on inspection of external nose and ears  NECK: normal movements of the head and neck  LUNGS: on inspection no signs of respiratory distress, breathing rate appears normal, no obvious gross SOB, gasping or wheezing  CV: no obvious cyanosis  MS: moves all visible extremities without noticeable abnormality  PSYCH/NEURO: pleasant and cooperative, no obvious depression or anxiety, speech and thought processing grossly intact  ASSESSMENT AND PLAN:  Discussed the following assessment and plan:  Weight gain  -discussed various  causes including changes in metabolism -continue current lifestyle modifications -Discussed obtaining labs to look for causes of weight gain including TSH, hemoglobin A1c -If labs negative discussed various medications to aid with weight loss including phentermine, Saxenda, orlistat -Pt encouraged to keep food diary.  Recommended eating dinner no later than 7 PM and switching up exercise regimen.  Follow-up in the next few weeks for CPE and labs.     I discussed the assessment and treatment plan with the patient. The patient was provided an opportunity to ask questions and all were answered. The patient agreed with the plan and demonstrated an understanding of the instructions.   The patient was advised to call back or seek an in-person evaluation if the symptoms worsen or if the condition fails to improve as anticipated.   [name], MD

## 2020-03-15 ENCOUNTER — Other Ambulatory Visit: Payer: Self-pay

## 2020-03-16 ENCOUNTER — Encounter: Payer: Self-pay | Admitting: Family Medicine

## 2020-03-16 ENCOUNTER — Other Ambulatory Visit: Payer: Self-pay

## 2020-03-16 ENCOUNTER — Ambulatory Visit (INDEPENDENT_AMBULATORY_CARE_PROVIDER_SITE_OTHER): Payer: Self-pay | Admitting: Family Medicine

## 2020-03-16 VITALS — BP 110/78 | HR 70 | Temp 97.7°F | Wt 195.0 lb

## 2020-03-16 DIAGNOSIS — R7989 Other specified abnormal findings of blood chemistry: Secondary | ICD-10-CM

## 2020-03-16 DIAGNOSIS — E118 Type 2 diabetes mellitus with unspecified complications: Secondary | ICD-10-CM

## 2020-03-16 DIAGNOSIS — Z0000 Encounter for general adult medical examination without abnormal findings: Secondary | ICD-10-CM

## 2020-03-16 DIAGNOSIS — I10 Essential (primary) hypertension: Secondary | ICD-10-CM

## 2020-03-16 DIAGNOSIS — R635 Abnormal weight gain: Secondary | ICD-10-CM

## 2020-03-16 DIAGNOSIS — E119 Type 2 diabetes mellitus without complications: Secondary | ICD-10-CM

## 2020-03-16 LAB — CBC
HCT: 37.5 % (ref 36.0–46.0)
Hemoglobin: 12.5 g/dL (ref 12.0–15.0)
MCHC: 33.3 g/dL (ref 30.0–36.0)
MCV: 89.4 fl (ref 78.0–100.0)
Platelets: 547 10*3/uL — ABNORMAL HIGH (ref 150.0–400.0)
RBC: 4.2 Mil/uL (ref 3.87–5.11)
RDW: 12.6 % (ref 11.5–15.5)
WBC: 9 10*3/uL (ref 4.0–10.5)

## 2020-03-16 LAB — COMPREHENSIVE METABOLIC PANEL
ALT: 12 U/L (ref 0–35)
AST: 14 U/L (ref 0–37)
Albumin: 4 g/dL (ref 3.5–5.2)
Alkaline Phosphatase: 37 U/L — ABNORMAL LOW (ref 39–117)
BUN: 20 mg/dL (ref 6–23)
CO2: 27 mEq/L (ref 19–32)
Calcium: 9.8 mg/dL (ref 8.4–10.5)
Chloride: 103 mEq/L (ref 96–112)
Creatinine, Ser: 0.95 mg/dL (ref 0.40–1.20)
GFR: 77.37 mL/min (ref >60.00)
Glucose, Bld: 127 mg/dL — ABNORMAL HIGH (ref 70–99)
Potassium: 4.5 mEq/L (ref 3.5–5.1)
Sodium: 139 mEq/L (ref 135–145)
Total Bilirubin: 0.5 mg/dL (ref 0.2–1.2)
Total Protein: 7 g/dL (ref 6.0–8.3)

## 2020-03-16 LAB — LIPID PANEL
Cholesterol: 237 mg/dL — ABNORMAL HIGH (ref 0–200)
HDL: 46 mg/dL (ref >39.00)
LDL Cholesterol: 163 mg/dL — ABNORMAL HIGH (ref 0–99)
NonHDL: 191.36
Total CHOL/HDL Ratio: 5
Triglycerides: 140 mg/dL (ref 0.0–149.0)
VLDL: 28 mg/dL (ref 0.0–40.0)

## 2020-03-16 LAB — MICROALBUMIN / CREATININE URINE RATIO
Creatinine,U: 97.3 mg/dL
Microalb Creat Ratio: 0.7 mg/g (ref 0.0–30.0)
Microalb, Ur: <0.7 mg/dL (ref 0.0–1.9)

## 2020-03-16 LAB — HEMOGLOBIN A1C: Hgb A1c MFr Bld: 7.6 % — ABNORMAL HIGH (ref 4.6–6.5)

## 2020-03-16 LAB — VITAMIN D 25 HYDROXY (VIT D DEFICIENCY, FRACTURES): VITD: 55.9 ng/mL (ref 30.00–100.00)

## 2020-03-16 LAB — TSH: TSH: 0.98 u[IU]/mL (ref 0.35–4.50)

## 2020-03-16 LAB — T4, FREE: Free T4: 0.87 ng/dL (ref 0.60–1.60)

## 2020-03-16 MED ORDER — SYNJARDY XR 12.5-1000 MG PO TB24: 2.0000 | ORAL_TABLET | Freq: Every day | ORAL | 3 refills | Status: DC

## 2020-03-16 NOTE — Patient Instructions (Signed)
Preventive Care 40-44 Years Old, Female Preventive care refers to visits with your health care provider and lifestyle choices that can promote health and wellness. This includes:  A yearly physical exam. This may also be called an annual well check.  Regular dental visits and eye exams.  Immunizations.  Screening for certain conditions.  Healthy lifestyle choices, such as eating a healthy diet, getting regular exercise, not using drugs or products that contain nicotine and tobacco, and limiting alcohol use. What can I expect for my preventive care visit? Physical exam Your health care provider will check your:  Height and weight. This may be used to calculate body mass index (BMI), which tells if you are at a healthy weight.  Heart rate and blood pressure.  Skin for abnormal spots. Counseling Your health care provider may ask you questions about your:  Alcohol, tobacco, and drug use.  Emotional well-being.  Home and relationship well-being.  Sexual activity.  Eating habits.  Work and work environment.  Method of birth control.  Menstrual cycle.  Pregnancy history. What immunizations do I need?  Influenza (flu) vaccine  This is recommended every year. Tetanus, diphtheria, and pertussis (Tdap) vaccine  You may need a Td booster every 10 years. Varicella (chickenpox) vaccine  You may need this if you have not been vaccinated. Zoster (shingles) vaccine  You may need this after age 60. Measles, mumps, and rubella (MMR) vaccine  You may need at least one dose of MMR if you were born in 1957 or later. You may also need a second dose. Pneumococcal conjugate (PCV13) vaccine  You may need this if you have certain conditions and were not previously vaccinated. Pneumococcal polysaccharide (PPSV23) vaccine  You may need one or two doses if you smoke cigarettes or if you have certain conditions. Meningococcal conjugate (MenACWY) vaccine  You may need this if you  have certain conditions. Hepatitis A vaccine  You may need this if you have certain conditions or if you travel or work in places where you may be exposed to hepatitis A. Hepatitis B vaccine  You may need this if you have certain conditions or if you travel or work in places where you may be exposed to hepatitis B. Haemophilus influenzae type b (Hib) vaccine  You may need this if you have certain conditions. Human papillomavirus (HPV) vaccine  If recommended by your health care provider, you may need three doses over 6 months. You may receive vaccines as individual doses or as more than one vaccine together in one shot (combination vaccines). Talk with your health care provider about the risks and benefits of combination vaccines. What tests do I need? Blood tests  Lipid and cholesterol levels. These may be checked every 5 years, or more frequently if you are over 50 years old.  Hepatitis C test.  Hepatitis B test. Screening  Lung cancer screening. You may have this screening every year starting at age 55 if you have a 30-pack-year history of smoking and currently smoke or have quit within the past 15 years.  Colorectal cancer screening. All adults should have this screening starting at age 50 and continuing until age 75. Your health care provider may recommend screening at age 45 if you are at increased risk. You will have tests every 1-10 years, depending on your results and the type of screening test.  Diabetes screening. This is done by checking your blood sugar (glucose) after you have not eaten for a while (fasting). You may have this   done every 1-3 years.  Mammogram. This may be done every 1-2 years. Talk with your health care provider about when you should start having regular mammograms. This may depend on whether you have a family history of breast cancer.  BRCA-related cancer screening. This may be done if you have a family history of breast, ovarian, tubal, or peritoneal  cancers.  Pelvic exam and Pap test. This may be done every 3 years starting at age 74. Starting at age 71, this may be done every 5 years if you have a Pap test in combination with an HPV test. Other tests  Sexually transmitted disease (STD) testing.  Bone density scan. This is done to screen for osteoporosis. You may have this scan if you are at high risk for osteoporosis. Follow these instructions at home: Eating and drinking  Eat a diet that includes fresh fruits and vegetables, whole grains, lean protein, and low-fat dairy.  Take vitamin and mineral supplements as recommended by your health care provider.  Do not drink alcohol if: ? Your health care provider tells you not to drink. ? You are pregnant, may be pregnant, or are planning to become pregnant.  If you drink alcohol: ? Limit how much you have to 0-1 drink a day. ? Be aware of how much alcohol is in your drink. In the [place] one drink equals one 12 oz bottle of beer (355 mL), one 5 oz glass of wine (148 mL), or one 1 oz glass of hard liquor (44 mL). Lifestyle  Take daily care of your teeth and gums.  Stay active. Exercise for at least 30 minutes on 5 or more days each week.  Do not use any products that contain nicotine or tobacco, such as cigarettes, e-cigarettes, and chewing tobacco. If you need help quitting, ask your health care provider.  If you are sexually active, practice safe sex. Use a condom or other form of birth control (contraception) in order to prevent pregnancy and STIs (sexually transmitted infections).  If told by your health care provider, take low-dose aspirin daily starting at age 23. What's next?  Visit your health care provider once a year for a well check visit.  Ask your health care provider how often you should have your eyes and teeth checked.  Stay up to date on all vaccines. This information is not intended to replace advice given to you by your health care provider. Make sure you  discuss any questions you have with your health care provider. Document Revised: 07/30/2018 Document Reviewed: 07/30/2018 Elsevier Patient Education  [place].  Diabetes Mellitus and Exercise Exercising regularly is important for your overall health, especially when you have diabetes (diabetes mellitus). Exercising is not only about losing weight. It has many other health benefits, such as increasing muscle strength and bone density and reducing body fat and stress. This leads to improved fitness, flexibility, and endurance, all of which result in better overall health. Exercise has additional benefits for people with diabetes, including:  Reducing appetite.  Helping to lower and control blood glucose.  Lowering blood pressure.  Helping to control amounts of fatty substances (lipids) in the blood, such as cholesterol and triglycerides.  Helping the body to respond better to insulin (improving insulin sensitivity).  Reducing how much insulin the body needs.  Decreasing the risk for heart disease by: ? Lowering cholesterol and triglyceride levels. ? Increasing the levels of good cholesterol. ? Lowering blood glucose levels. What is my activity plan? Your health care  provider or certified diabetes educator can help you make a plan for the type and frequency of exercise (activity plan) that works for you. Make sure that you:  Do at least 150 minutes of moderate-intensity or vigorous-intensity exercise each week. This could be brisk walking, biking, or water aerobics. ? Do stretching and strength exercises, such as yoga or weightlifting, at least 2 times a week. ? Spread out your activity over at least 3 days of the week.  Get some form of physical activity every day. ? Do not go more than 2 days in a row without some kind of physical activity. ? Avoid being inactive for more than 30 minutes at a time. Take frequent breaks to walk or stretch.  Choose a type of exercise or  activity that you enjoy, and set realistic goals.  Start slowly, and gradually increase the intensity of your exercise over time. What do I need to know about managing my diabetes?   Check your blood glucose before and after exercising. ? If your blood glucose is 240 mg/dL (13.3 mmol/L) or higher before you exercise, check your urine for ketones. If you have ketones in your urine, do not exercise until your blood glucose returns to normal. ? If your blood glucose is 100 mg/dL (5.6 mmol/L) or lower, eat a snack containing 15-20 grams of carbohydrate. Check your blood glucose 15 minutes after the snack to make sure that your level is above 100 mg/dL (5.6 mmol/L) before you start your exercise.  Know the symptoms of low blood glucose (hypoglycemia) and how to treat it. Your risk for hypoglycemia increases during and after exercise. Common symptoms of hypoglycemia can include: ? Hunger. ? Anxiety. ? Sweating and feeling clammy. ? Confusion. ? Dizziness or feeling light-headed. ? Increased heart rate or palpitations. ? Blurry vision. ? Tingling or numbness around the mouth, lips, or tongue. ? Tremors or shakes. ? Irritability.  Keep a rapid-acting carbohydrate snack available before, during, and after exercise to help prevent or treat hypoglycemia.  Avoid injecting insulin into areas of the body that are going to be exercised. For example, avoid injecting insulin into: ? The arms, when playing tennis. ? The legs, when jogging.  Keep records of your exercise habits. Doing this can help you and your health care provider adjust your diabetes management plan as needed. Write down: ? Food that you eat before and after you exercise. ? Blood glucose levels before and after you exercise. ? The type and amount of exercise you have done. ? When your insulin is expected to peak, if you use insulin. Avoid exercising at times when your insulin is peaking.  When you start a new exercise or activity,  work with your health care provider to make sure the activity is safe for you, and to adjust your insulin, medicines, or food intake as needed.  Drink plenty of water while you exercise to prevent dehydration or heat stroke. Drink enough fluid to keep your urine clear or pale yellow. Summary  Exercising regularly is important for your overall health, especially when you have diabetes (diabetes mellitus).  Exercising has many health benefits, such as increasing muscle strength and bone density and reducing body fat and stress.  Your health care provider or certified diabetes educator can help you make a plan for the type and frequency of exercise (activity plan) that works for you.  When you start a new exercise or activity, work with your health care provider to make sure the activity is  safe for you, and to adjust your insulin, medicines, or food intake as needed. This information is not intended to replace advice given to you by your health care provider. Make sure you discuss any questions you have with your health care provider. Document Revised: 06/12/2017 Document Reviewed: 04/29/2016 Elsevier Patient Education  2020 [employer].  Exercising to Lose Weight Exercise is structured, repetitive physical activity to improve fitness and health. Getting regular exercise is important for everyone. It is especially important if you are overweight. Being overweight increases your risk of heart disease, stroke, diabetes, high blood pressure, and several types of cancer. Reducing your calorie intake and exercising can help you lose weight. Exercise is usually categorized as moderate or vigorous intensity. To lose weight, most people need to do a certain amount of moderate-intensity or vigorous-intensity exercise each week. Moderate-intensity exercise  Moderate-intensity exercise is any activity that gets you moving enough to burn at least three times more energy (calories) than if you were  sitting. Examples of moderate exercise include:  Walking a mile in 15 minutes.  Doing light yard work.  Biking at an easy pace. Most people should get at least 150 minutes (2 hours and 30 minutes) a week of moderate-intensity exercise to maintain their body weight. Vigorous-intensity exercise Vigorous-intensity exercise is any activity that gets you moving enough to burn at least six times more calories than if you were sitting. When you exercise at this intensity, you should be working hard enough that you are not able to carry on a conversation. Examples of vigorous exercise include:  Running.  Playing a team sport, such as football, basketball, and soccer.  Jumping rope. Most people should get at least 75 minutes (1 hour and 15 minutes) a week of vigorous-intensity exercise to maintain their body weight. How can exercise affect me? When you exercise enough to burn more calories than you eat, you lose weight. Exercise also reduces body fat and builds muscle. The more muscle you have, the more calories you burn. Exercise also:  Improves mood.  Reduces stress and tension.  Improves your overall fitness, flexibility, and endurance.  Increases bone strength. The amount of exercise you need to lose weight depends on:  Your age.  The type of exercise.  Any health conditions you have.  Your overall physical ability. Talk to your health care provider about how much exercise you need and what types of activities are safe for you. What actions can I take to lose weight? Nutrition   Make changes to your diet as told by your health care provider or diet and nutrition specialist (dietitian). This may include: ? Eating fewer calories. ? Eating more protein. ? Eating less unhealthy fats. ? Eating a diet that includes fresh fruits and vegetables, whole grains, low-fat dairy products, and lean protein. ? Avoiding foods with added fat, salt, and sugar.  Drink plenty of water while  you exercise to prevent dehydration or heat stroke. Activity  Choose an activity that you enjoy and set realistic goals. Your health care provider can help you make an exercise plan that works for you.  Exercise at a moderate or vigorous intensity most days of the week. ? The intensity of exercise may vary from person to person. You can tell how intense a workout is for you by paying attention to your breathing and heartbeat. Most people will notice their breathing and heartbeat get faster with more intense exercise.  Do resistance training twice each week, such as: ? Push-ups. ?  Sit-ups. ? Lifting weights. ? Using resistance bands.  Getting short amounts of exercise can be just as helpful as long structured periods of exercise. If you have trouble finding time to exercise, try to include exercise in your daily routine. ? Get up, stretch, and walk around every 30 minutes throughout the day. ? Go for a walk during your lunch break. ? Park your car farther away from your destination. ? If you take public transportation, get off one stop early and walk the rest of the way. ? Make phone calls while standing up and walking around. ? Take the stairs instead of elevators or escalators.  Wear comfortable clothes and shoes with good support.  Do not exercise so much that you hurt yourself, feel dizzy, or get very short of breath. Where to find more information  [place] Department of Health and Human Services: [employer]  Centers for Disease Control and Prevention (CDC): http://www.wolf.info/ Contact a health care provider:  Before starting a new exercise program.  If you have questions or concerns about your weight.  If you have a medical problem that keeps you from exercising. Get help right away if you have any of the following while exercising:  Injury.  Dizziness.  Difficulty breathing or shortness of breath that does not go away when you stop exercising.  Chest pain.  Rapid  heartbeat. Summary  Being overweight increases your risk of heart disease, stroke, diabetes, high blood pressure, and several types of cancer.  Losing weight happens when you burn more calories than you eat.  Reducing the amount of calories you eat in addition to getting regular moderate or vigorous exercise each week helps you lose weight. This information is not intended to replace advice given to you by your health care provider. Make sure you discuss any questions you have with your health care provider. Document Revised: 12/01/2017 Document Reviewed: 12/01/2017 Elsevier Patient Education  2020 [employer].

## 2020-03-16 NOTE — Progress Notes (Signed)
Subjective:     Brenda Acosta is a 44 y.o. female and is here for a comprehensive physical exam. The patient reports problems - weight gain.  Pt is vegan, intermittent fasting, trying to eat more consistent meals and dinner earlier.  She has been meal prepping.  Exercising daily.  Pt having difficulty loosing the last 20-30 lbs. Previously weighed 330 lbs.    Pap scheduled in June with OB/Gyn.  Mammogram coming up. Social History   Socioeconomic History  . Marital status: Single    Spouse name: Not on file  . Number of children: 0  . Years of education: Not on file  . Highest education level: Not on file  Occupational History    Employer: [employer]  Tobacco Use  . Smoking status: Never Smoker  . Smokeless tobacco: Never Used  Substance and Sexual Activity  . Alcohol use: No  . Drug use: No  . Sexual activity: Yes    Partners: Male  Other Topics Concern  . 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 Avg. Glucose: 298      Regular exercise: no   Caffeine use: occassionally                  Social Determinants of Health   Financial Resource Strain:   . Difficulty of Paying Living Expenses:   Food Insecurity:   . Worried About Charity fundraiser in the Last Year:   . Arboriculturist in the Last Year:   Transportation Needs:   . Film/video editor (Medical):   Marland Kitchen Lack of Transportation (Non-Medical):   Physical Activity:   . Days of Exercise per Week:   . Minutes of Exercise per Session:   Stress:   . Feeling of Stress :   Social Connections:   . Frequency of Communication with Friends and Family:   . Frequency of Social Gatherings with Friends and Family:   . Attends Religious Services:   . Active Member of Clubs or Organizations:   . Attends Archivist  Meetings:   Marland Kitchen Marital Status:   Intimate Partner Violence:   . Fear of Current or Ex-Partner:   . Emotionally Abused:   Marland Kitchen Physically Abused:   . Sexually Abused:    Health Maintenance  Topic Date Due  . PNEUMOCOCCAL POLYSACCHARIDE VACCINE AGE 60-64 HIGH RISK  Never done  . FOOT EXAM  Never done  . HIV Screening  Never done  . OPHTHALMOLOGY EXAM  07/07/2014  . HEMOGLOBIN A1C  11/04/2019  . INFLUENZA VACCINE  07/02/2020  . PAP SMEAR-Modifier  04/21/2021  . TETANUS/TDAP  05/13/2022    The following portions of the patient's history were reviewed and updated as appropriate: allergies, current medications, past family history, past medical history, past social history, past surgical history and problem list.  Review of Systems Pertinent items noted in HPI and remainder of comprehensive ROS otherwise negative.   Objective:    BP 110/78   Pulse 70   Temp 97.7 F (36.5 C) (Temporal)   Wt 195 lb (88.5 kg)   SpO2 98%   BMI 31.47 kg/m  General appearance: alert, cooperative and no distress Head: Normocephalic, without obvious abnormality, atraumatic Eyes: conjunctivae/corneas clear. PERRL, EOM's  intact. Fundi benign. Ears: normal TM's and external ear canals both ears Nose: Nares normal. Septum midline. Mucosa normal. No drainage or sinus tenderness. Throat: lips, mucosa, and tongue normal; teeth and gums normal Neck: no adenopathy, no carotid bruit, no JVD, supple, symmetrical, trachea midline and thyroid not enlarged, symmetric, no tenderness/mass/nodules Lungs: clear to auscultation bilaterally Heart: regular rate and rhythm, S1, S2 normal, no murmur, click, rub or gallop Abdomen: soft, non-tender; bowel sounds normal; no masses,  no organomegaly Extremities: extremities normal, atraumatic, no cyanosis or edema Pulses: 2+ and symmetric Skin: Skin color, texture, turgor normal. No rashes or lesions Lymph nodes: Cervical, supraclavicular, and axillary nodes normal. Neurologic:  Alert and oriented X 3, normal strength and tone. Normal symmetric reflexes. Normal coordination and gait    Assessment:    Healthy female exam.      Plan:     Anticipatory guidance given including wearing seatbelts, smoke detectors in the home, increasing physical activity, increasing p.o. intake of water and vegetables. -will obtain labs -mammogram planned -pap in June with OB/Gyn -Given handout -Next CPE in 1 year See After Visit Summary for Counseling Recommendations    Weight gain  -Pt made significant lifestyle modifications and is exercising regularly -continue current plan -will consider medications to aid with weight loss including Saxenda, phentermine, orlistat. - Plan: TSH, T4, Free, Comprehensive metabolic panel  Diabetes mellitus without complication (HCC)  -Continue empagliflozin-Metformin 12.04-999 mg 2 tabs daily -Continue lifestyle modification - Plan: Hemoglobin A1c, Lipid panel, CBC (no diff), Microalbumin/Creatinine Ratio, Urine  Essential hypertension  -Controlled -Continue Lasix 20 mg, lisinopril-hydrochlorothiazide 20-25 mg - Plan: Comprehensive metabolic panel  Follow-up as needed  Grier Mitts, MD

## 2020-03-17 NOTE — Addendum Note (Signed)
Addended by: Elmer Picker on: 03/17/2020 11:29 AM   Modules accepted: Orders

## 2020-03-19 ENCOUNTER — Encounter: Payer: Self-pay | Admitting: Family Medicine

## 2020-03-20 LAB — PATHOLOGIST SMEAR REVIEW

## 2020-03-22 ENCOUNTER — Encounter: Payer: Self-pay | Admitting: Family Medicine

## 2020-03-27 ENCOUNTER — Other Ambulatory Visit: Payer: Self-pay | Admitting: Family Medicine

## 2020-03-27 DIAGNOSIS — I10 Essential (primary) hypertension: Secondary | ICD-10-CM

## 2020-03-30 ENCOUNTER — Encounter: Payer: Self-pay | Admitting: Family Medicine

## 2020-04-10 ENCOUNTER — Ambulatory Visit (INDEPENDENT_AMBULATORY_CARE_PROVIDER_SITE_OTHER): Payer: BC Managed Care – PPO | Admitting: Family Medicine

## 2020-04-10 ENCOUNTER — Encounter: Payer: Self-pay | Admitting: Family Medicine

## 2020-04-10 ENCOUNTER — Other Ambulatory Visit: Payer: Self-pay

## 2020-04-10 VITALS — BP 110/78 | HR 85 | Temp 97.2°F | Wt 196.0 lb

## 2020-04-10 DIAGNOSIS — D473 Essential (hemorrhagic) thrombocythemia: Secondary | ICD-10-CM | POA: Diagnosis not present

## 2020-04-10 DIAGNOSIS — E1139 Type 2 diabetes mellitus with other diabetic ophthalmic complication: Secondary | ICD-10-CM | POA: Diagnosis not present

## 2020-04-10 DIAGNOSIS — D75839 Thrombocytosis, unspecified: Secondary | ICD-10-CM

## 2020-04-10 DIAGNOSIS — R635 Abnormal weight gain: Secondary | ICD-10-CM | POA: Diagnosis not present

## 2020-04-10 MED ORDER — LIRAGLUTIDE 18 MG/3ML ~~LOC~~ SOPN: PEN_INJECTOR | SUBCUTANEOUS | 3 refills | Status: DC

## 2020-04-10 MED ORDER — PEN NEEDLES 32G X 5 MM MISC: 1 refills | Status: DC

## 2020-04-10 NOTE — Progress Notes (Signed)
Subjective:    Patient ID: Brenda Acosta, female    DOB: Mar 26, 1976, 44 y.o.   MRN: [id number]  No chief complaint on file.   HPI Patient was seen today for f/u.  Pt interested in medication for weight loss.  Pt currently taking empagliflozin-metformin 12.5 mg-1000 mg 2 tabs daily.  Pt exercising and consistent with lifestyle modifications, but having difficulty losing remaining weight.  Pt weighed 330 lbs.  She lost 100 lbs by going vegan.  She also decreased her carb and sugar intake.  Running 1 mile daily.  Denies personal or family hx of thyroid cancer.  Past Medical History:  Diagnosis Date  . Chest pain    a. 09/2015 Ex MV: EF >55%, no ischemia/infarct.  . Diabetes mellitus without complication ([place])    a. 11/20/2015 A1c 11.5;  b.  noncompliant w/ diabetes meds, glucose checks, f/u, etc.  . Dyspnea on exertion    a. 09/2015 Echo: Ef 60-65%, no rwma, mild AI, mildly dil LA.  . Essential hypertension   . Hyperlipidemia   . Morbid obesity ([place])   . Mucinous cystadenoma of ovary    Hx of   . Noncompliance     Allergies  Allergen Reactions  . Benzoyl Peroxide Other (See Comments)    [place]     ROS General: Denies fever, chills, night sweats, changes in weight, changes in appetite  +weight gain HEENT: Denies headaches, ear pain, changes in vision, rhinorrhea, sore throat CV: Denies CP, palpitations, SOB, orthopnea Pulm: Denies SOB, cough, wheezing GI: Denies abdominal pain, nausea, vomiting, diarrhea, constipation GU: Denies dysuria, hematuria, frequency, vaginal discharge Msk: Denies muscle cramps, joint pains Neuro: Denies weakness, numbness, tingling Skin: Denies rashes, bruising Psych: Denies depression, anxiety, hallucinations     Objective:    Blood pressure 110/78, pulse 85, temperature (!) 97.2 F (36.2 C), temperature source Temporal, weight 196 lb (88.9 kg), SpO2 98 %.   Gen. Pleasant, well-nourished, in no distress, normal affect   HEENT: [place]/AT, face  symmetric, conjunctiva clear, no scleral icterus, PERRLA, EOMI, nares patent without drainage Lungs: no accessory muscle use Cardiovascular: RRR, no peripheral edema Neuro:  A&Ox3, CN II-XII intact, normal gait Skin:  Warm, no lesions/ rash  Wt Readings from Last 3 Encounters:  04/10/20 196 lb (88.9 kg)  03/16/20 195 lb (88.5 kg)  03/08/20 193 lb (87.5 kg)    Lab Results  Component Value Date   WBC 9.0 03/16/2020   HGB 12.5 03/16/2020   HCT 37.5 03/16/2020   PLT 547.0 (H) 03/16/2020   GLUCOSE 127 (H) 03/16/2020   CHOL 237 (H) 03/16/2020   TRIG 140.0 03/16/2020   HDL 46.00 03/16/2020   LDLCALC 163 (H) 03/16/2020   ALT 12 03/16/2020   AST 14 03/16/2020   NA 139 03/16/2020   K 4.5 03/16/2020   CL 103 03/16/2020   CREATININE 0.95 03/16/2020   BUN 20 03/16/2020   CO2 27 03/16/2020   TSH 0.98 03/16/2020   HGBA1C 7.6 (H) 03/16/2020   MICROALBUR <0.7 03/16/2020    Assessment/Plan:  Type 2 diabetes mellitus with other ophthalmic complication, without long-term current use of insulin ([place])  -discussed various options to help with weight loss. -given h/o DM will start Victoza 0.6 mg daily x 1 wk.  Will increase by 0.6 mg wkly as tolerated with max dose 3 mg/day. -will d/c Empagliflozin-metformin 12.04-999 mg 2 tabs daily -pt to check fsbs regularly -given precautions -continue lifestyle modifications -labs  - Plan: liraglutide (VICTOZA) 18 MG/3ML  SOPN, Insulin Pen Needle (PEN NEEDLES) 32G X 5 MM MISC -f/u in 1 month  Weight gain  -continue lifestyle modifications - Plan: liraglutide (VICTOZA) 18 MG/3ML SOPN  Thrombocytosis -plts elevated at 547 on 03/17/20, peripheral smear with thrombocytosis and mature segmented neutrophils.  No immature cells.  RBC unremarkable. -discussed possible causes including reactive processes, medications, etc -will recheck in the next few wks. -for continued elevation refer to Heme/onc  F/u in 1 month, sooner if needed.  Grier Mitts,  MD

## 2020-04-12 ENCOUNTER — Encounter: Payer: Self-pay | Admitting: Family Medicine

## 2020-04-14 ENCOUNTER — Telehealth: Payer: Self-pay | Admitting: Family Medicine

## 2020-04-14 NOTE — Telephone Encounter (Signed)
   Victoza over Saxenda pt has question  Would like a call

## 2020-04-19 ENCOUNTER — Encounter: Payer: Self-pay | Admitting: Family Medicine

## 2020-04-19 ENCOUNTER — Telehealth (INDEPENDENT_AMBULATORY_CARE_PROVIDER_SITE_OTHER): Payer: BC Managed Care – PPO | Admitting: Family Medicine

## 2020-04-19 DIAGNOSIS — E1139 Type 2 diabetes mellitus with other diabetic ophthalmic complication: Secondary | ICD-10-CM | POA: Diagnosis not present

## 2020-04-19 DIAGNOSIS — Z6831 Body mass index (BMI) 31.0-31.9, adult: Secondary | ICD-10-CM

## 2020-04-19 DIAGNOSIS — E669 Obesity, unspecified: Secondary | ICD-10-CM | POA: Diagnosis not present

## 2020-04-19 MED ORDER — SAXENDA 18 MG/3ML ~~LOC~~ SOPN: PEN_INJECTOR | SUBCUTANEOUS | 3 refills | Status: DC

## 2020-04-19 NOTE — Telephone Encounter (Signed)
Pt is scheduled to see Dr Volanda Napoleon today afternoon for this concern

## 2020-04-19 NOTE — Progress Notes (Signed)
Virtual Visit via Telephone Note  I connected with Brenda Acosta on 04/19/20 at  2:00 PM EDT by telephone and verified that I am speaking with the correct person using two identifiers.   I discussed the limitations, risks, security and privacy concerns of performing an evaluation and management service by telephone and the availability of in person appointments. I also discussed with the patient that there may be a patient responsible charge related to this service. The patient expressed understanding and agreed to proceed.  Location patient: home Location provider: work or home office Participants present for the call: patient, provider Patient did not have a visit in the prior 7 days to address this/these issue(s).   History of Present Illness: Pt is a 44 yo female with pmh sig for DM II ophthalmic complications, HTN, obesity, HLD who  has been unable to get ligaglutide 2/2 issues with the rx at the pharmacy.  Pt wanted to clarify dose and amount in each pen as rx sent in was for victoza given pt's h/o DM.   Observations/Objective: Patient sounds cheerful and well on the phone. I do not appreciate any SOB. Speech and thought processing are grossly intact. Patient reported vitals:  Assessment and Plan: Type 2 diabetes mellitus with other ophthalmic complication, without long-term current use of insulin ([place])  -pt has rx for pen needles. - Plan: Liraglutide -Weight Management (SAXENDA) 18 MG/3ML SOPN  Class 1 obesity with serious comorbidity and body mass index (BMI) of 31.0 to 31.9 in adult, unspecified obesity type - Plan: Liraglutide -Weight Management (SAXENDA) 18 MG/3ML SOPN  Reviewed dosing of liraglutide for weight loss.  Pt will also be taking for DM.  As dosing for weight loss increases by 0.6 mg weekly as tolerated with max dose 3 mg/ day, rx for saxenda sent to pharmacy to decrease confusion.  Pt to continue lifestyle modifications and checking fsbs.    Follow Up  Instructions: F/u in 1 month, sooner if needed  I did not refer this patient for an OV in the next 24 hours for this/these issue(s).  I discussed the assessment and treatment plan with the patient. The patient was provided an opportunity to ask questions and all were answered. The patient agreed with the plan and demonstrated an understanding of the instructions.   The patient was advised to call back or seek an in-person evaluation if the symptoms worsen or if the condition fails to improve as anticipated.  I provided 11 minutes of non-face-to-face time during this encounter.   [name], MD

## 2020-04-19 NOTE — Telephone Encounter (Signed)
Message Routed to PCP CMA 

## 2020-04-19 NOTE — Telephone Encounter (Signed)
Pt has a video visit today with Dr Volanda Napoleon

## 2020-05-13 ENCOUNTER — Encounter: Payer: Self-pay | Admitting: Family Medicine

## 2020-05-15 NOTE — Telephone Encounter (Signed)
Please advise 

## 2020-05-19 NOTE — Telephone Encounter (Signed)
Medication Refill:  5 pens of [place]: [place]: [phone]  Pt would like a call back if this will refilled today [phone] Pt is aware Volanda Napoleon is not in the office and would like to be notified if this has to wait until she returns

## 2020-05-27 ENCOUNTER — Other Ambulatory Visit: Payer: Self-pay | Admitting: Family Medicine

## 2020-06-06 ENCOUNTER — Telehealth: Payer: Self-pay | Admitting: Family Medicine

## 2020-06-06 NOTE — Telephone Encounter (Signed)
Insulin Pen Needle (PEN NEEDLES) 32G X 5 MM MISC   The patient called in needing a refill on victoza injections.   She needs 5 pens instead of 3 pens.  She has 1 day left   [place], [place] Phone:  [phone]  Fax:  [phone]

## 2020-06-06 NOTE — Telephone Encounter (Signed)
This medication is not on pt chart, pt is on Saxenda, please advise

## 2020-06-12 ENCOUNTER — Other Ambulatory Visit: Payer: Self-pay | Admitting: Family Medicine

## 2020-06-12 DIAGNOSIS — E669 Obesity, unspecified: Secondary | ICD-10-CM

## 2020-06-12 DIAGNOSIS — Z6831 Body mass index (BMI) 31.0-31.9, adult: Secondary | ICD-10-CM

## 2020-06-12 DIAGNOSIS — E1139 Type 2 diabetes mellitus with other diabetic ophthalmic complication: Secondary | ICD-10-CM

## 2020-06-12 MED ORDER — PEN NEEDLES 32G X 5 MM MISC: 2 refills | Status: DC

## 2020-06-12 MED ORDER — SAXENDA 18 MG/3ML ~~LOC~~ SOPN: PEN_INJECTOR | SUBCUTANEOUS | 3 refills | Status: DC

## 2020-06-12 NOTE — Telephone Encounter (Signed)
Refill sent to pharmacy for [place] and pen needles.

## 2020-06-13 ENCOUNTER — Telehealth: Payer: Self-pay

## 2020-06-13 NOTE — Telephone Encounter (Signed)
Brenda Acosta (Brenda Acosta) Rx #: [id number] Saxenda 18MG [place] Mediate pen-injectors   Form [employer] [place] [employer] Electronic Request Form (CB) Created 1 day ago Sent to Plan 8 minutes ago Plan Response 8 minutes ago Submit Clinical Questions less than a minute ago Determination

## 2020-06-14 NOTE — Telephone Encounter (Signed)
Please advise 

## 2020-06-16 NOTE — Telephone Encounter (Signed)
Patient notified of update  and verbalized understanding. 

## 2020-06-16 NOTE — Telephone Encounter (Signed)
[place] (Key[facility]enna Gilbert) Rx #: [id number] Saxenda 18MG Fayne Mediate pen-injectors   Form [employer] Building control surveyor Form (CB) Created 4 days ago Sent to Plan 3 days ago Plan Response 3 days ago Submit Clinical Questions 3 days ago Determination Favorable 20 hours ago Your prior authorization for [place] has been approved! MORE INFO For eligible patients, copay assistance may be available. To learn more and be redirected to the [facility] website, click on the "More Info" button to the right. Please also note that you may need to schedule a follow-up visit with your patient prior to the expiration of this prior authorization, as updated patient weight may be required for reauthorization.  Message from plan: Effective from 06/13/2020 through 10/16/2020.

## 2020-06-23 NOTE — Telephone Encounter (Signed)
Prescription was sent to the pharmacy to dispense 5 pens with refills on 06/12/2020.

## 2020-07-02 ENCOUNTER — Other Ambulatory Visit: Payer: Self-pay | Admitting: Family Medicine

## 2020-07-02 DIAGNOSIS — I10 Essential (primary) hypertension: Secondary | ICD-10-CM

## 2020-07-28 IMAGING — DX DG SHOULDER 2+V*L*
3 series · 3 of 3 positions shown · non-contrast
Comparison: None.

CLINICAL DATA: Left rib pain for 5 weeks, limited range of motion

EXAM:
LEFT SHOULDER - 2+ VIEW

[grashey]
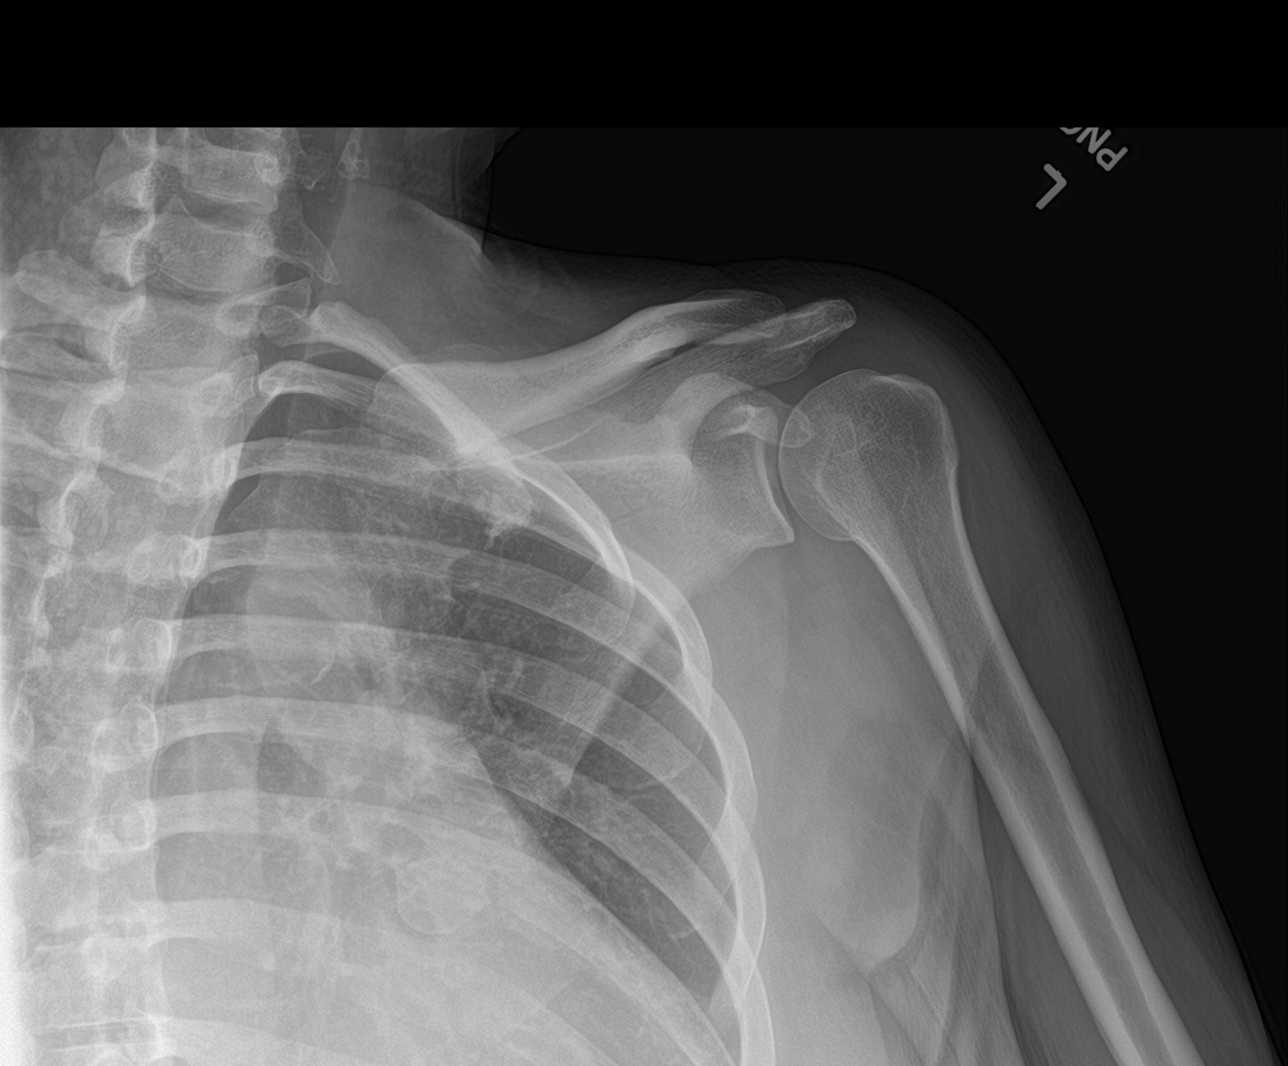

[y view]
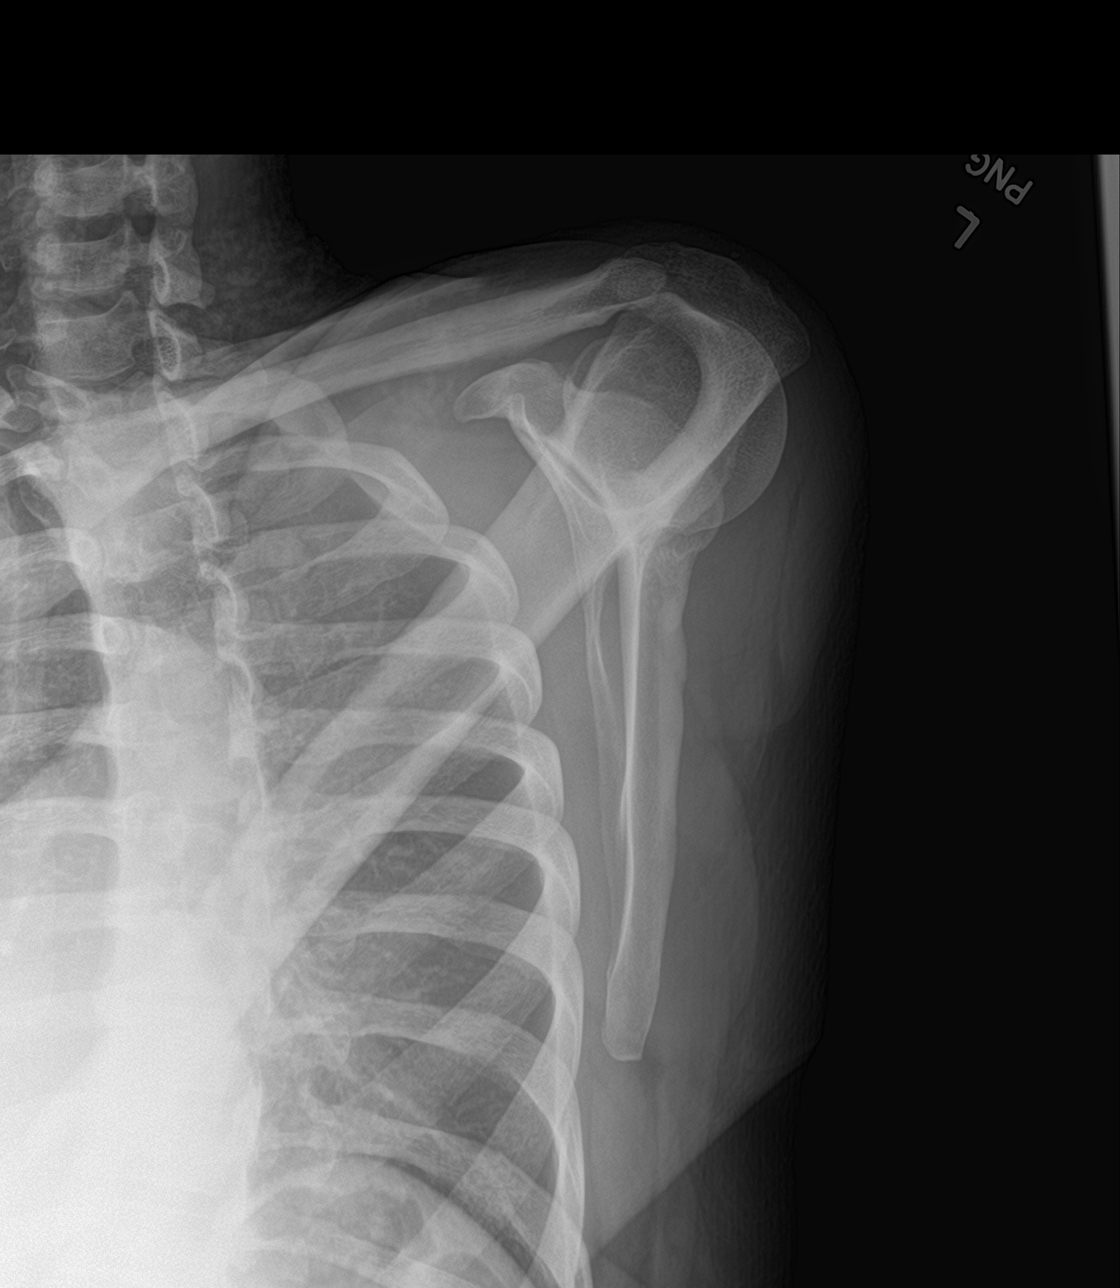

[shoulder axial]
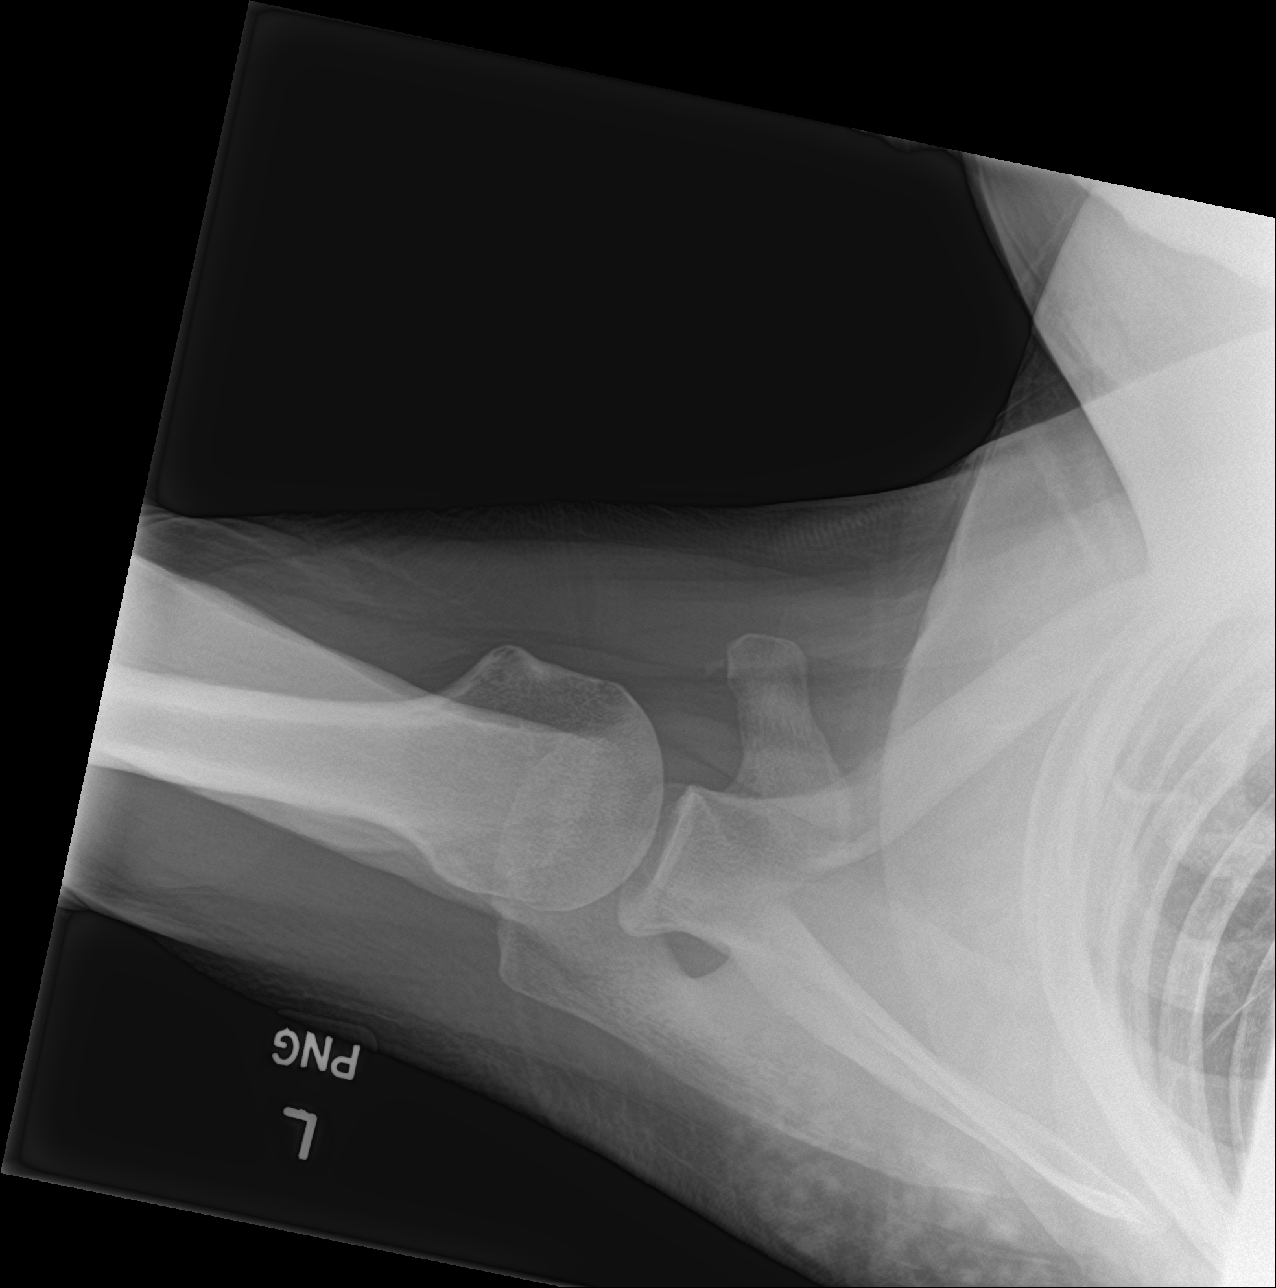

[3 of 3 positions shown; findings below may reference images not displayed]

FINDINGS: Normal alignment without dislocation. Small mineralized density
projects adjacent to the lateral margin of the coracoid process
axial view. Otherwise, there is no evidence of arthropathy or other
focal bone abnormality. Soft tissues are unremarkable.
IMPRESSION: Small mineralized density adjacent to the lateral margin of the
coracoid process axial view. Findings are nonspecific and may
possibly reflect mineralization at the ligamentous attachment
(possibly coracohumeral ligament). A tiny avulsion fracture fragment
at this site is not entirely excluded. Correlation with point
tenderness is recommended.

## 2020-08-04 DIAGNOSIS — Z20822 Contact with and (suspected) exposure to covid-19: Secondary | ICD-10-CM | POA: Diagnosis not present

## 2020-09-05 ENCOUNTER — Encounter: Payer: Self-pay | Admitting: Family Medicine

## 2020-10-02 ENCOUNTER — Encounter: Payer: Self-pay | Admitting: Family Medicine

## 2020-10-05 ENCOUNTER — Other Ambulatory Visit: Payer: Self-pay | Admitting: Family Medicine

## 2020-10-05 DIAGNOSIS — I10 Essential (primary) hypertension: Secondary | ICD-10-CM

## 2020-10-11 ENCOUNTER — Other Ambulatory Visit: Payer: Self-pay | Admitting: Family Medicine

## 2020-10-11 DIAGNOSIS — I10 Essential (primary) hypertension: Secondary | ICD-10-CM

## 2020-10-11 MED ORDER — LISINOPRIL-HYDROCHLOROTHIAZIDE 20-25 MG PO TABS: 1.0000 | ORAL_TABLET | Freq: Every day | ORAL | 0 refills | Status: DC

## 2020-10-12 NOTE — Telephone Encounter (Signed)
Last OV 04/19/20 Last refill 10/11/20 #90/0 Next OV not scheduled

## 2020-10-13 NOTE — Telephone Encounter (Signed)
Pt physical form letter is completed and faxed to fax number provided by pt, pt is aware that the letter has been faxed

## 2020-10-24 ENCOUNTER — Telehealth: Payer: Self-pay | Admitting: Family Medicine

## 2020-10-24 NOTE — Telephone Encounter (Signed)
Patient is calling and requesting a refill for Saxenda sent to [place] [facility] [place], [place]  Phone:  [phone] Fax:  [phone]  CB is [phone]

## 2020-10-24 NOTE — Telephone Encounter (Signed)
Pt LOV was on 04/19/2020 and last refill was done on 06/12/2020 for 5 pens with 3 refills, Please advise if ok for refill

## 2020-10-30 NOTE — Telephone Encounter (Signed)
No.  Pt needs to be seen in office.

## 2020-10-31 NOTE — Telephone Encounter (Signed)
Patient has appt for 12/2.

## 2020-11-02 ENCOUNTER — Other Ambulatory Visit: Payer: Self-pay

## 2020-11-02 ENCOUNTER — Encounter: Payer: Self-pay | Admitting: Family Medicine

## 2020-11-02 ENCOUNTER — Ambulatory Visit: Payer: BC Managed Care – PPO | Admitting: Family Medicine

## 2020-11-02 VITALS — BP 118/64 | HR 68 | Temp 98.8°F | Wt 180.6 lb

## 2020-11-02 DIAGNOSIS — E1139 Type 2 diabetes mellitus with other diabetic ophthalmic complication: Secondary | ICD-10-CM

## 2020-11-02 DIAGNOSIS — Z789 Other specified health status: Secondary | ICD-10-CM

## 2020-11-02 DIAGNOSIS — E669 Obesity, unspecified: Secondary | ICD-10-CM | POA: Diagnosis not present

## 2020-11-02 DIAGNOSIS — Z6831 Body mass index (BMI) 31.0-31.9, adult: Secondary | ICD-10-CM | POA: Diagnosis not present

## 2020-11-02 MED ORDER — SAXENDA 18 MG/3ML ~~LOC~~ SOPN: 3.0000 mg | PEN_INJECTOR | Freq: Every day | SUBCUTANEOUS | 1 refills | Status: DC

## 2020-11-02 NOTE — Patient Instructions (Signed)
Calorie Counting for Weight Loss Calories are units of energy. Your body needs a certain amount of calories from food to keep you going throughout the day. When you eat more calories than your body needs, your body stores the extra calories as fat. When you eat fewer calories than your body needs, your body burns fat to get the energy it needs. Calorie counting means keeping track of how many calories you eat and drink each day. Calorie counting can be helpful if you need to lose weight. If you make sure to eat fewer calories than your body needs, you should lose weight. Ask your health care provider what a healthy weight is for you. For calorie counting to work, you will need to eat the right number of calories in a day in order to lose a healthy amount of weight per week. A dietitian can help you determine how many calories you need in a day and will give you suggestions on how to reach your calorie goal.  A healthy amount of weight to lose per week is usually 1-2 lb (0.5-0.9 kg). This usually means that your daily calorie intake should be reduced by 500-750 calories.  Eating 1,200 - 1,500 calories per day can help most women lose weight.  Eating 1,500 - 1,800 calories per day can help most men lose weight. What is my plan? My goal is to have __________ calories per day. If I have this many calories per day, I should lose around __________ pounds per week. What do I need to know about calorie counting? In order to meet your daily calorie goal, you will need to:  Find out how many calories are in each food you would like to eat. Try to do this before you eat.  Decide how much of the food you plan to eat.  Write down what you ate and how many calories it had. Doing this is called keeping a food log. To successfully lose weight, it is important to balance calorie counting with a healthy lifestyle that includes regular activity. Aim for 150 minutes of moderate exercise (such as walking) or 75  minutes of vigorous exercise (such as running) each week. Where do I find calorie information?  The number of calories in a food can be found on a Nutrition Facts label. If a food does not have a Nutrition Facts label, try to look up the calories online or ask your dietitian for help. Remember that calories are listed per serving. If you choose to have more than one serving of a food, you will have to multiply the calories per serving by the amount of servings you plan to eat. For example, the label on a package of bread might say that a serving size is 1 slice and that there are 90 calories in a serving. If you eat 1 slice, you will have eaten 90 calories. If you eat 2 slices, you will have eaten 180 calories. How do I keep a food log? Immediately after each meal, record the following information in your food log:  What you ate. Don't forget to include toppings, sauces, and other extras on the food.  How much you ate. This can be measured in cups, ounces, or number of items.  How many calories each food and drink had.  The total number of calories in the meal. Keep your food log near you, such as in a small notebook in your pocket, or use a mobile app or website. Some programs will calculate   calories for you and show you how many calories you have left for the day to meet your goal. What are some calorie counting tips?   Use your calories on foods and drinks that will fill you up and not leave you hungry: ? Some examples of foods that fill you up are nuts and nut butters, vegetables, lean proteins, and high-fiber foods like whole grains. High-fiber foods are foods with more than 5 g fiber per serving. ? Drinks such as sodas, specialty coffee drinks, alcohol, and juices have a lot of calories, yet do not fill you up.  Eat nutritious foods and avoid empty calories. Empty calories are calories you get from foods or beverages that do not have many vitamins or protein, such as candy, sweets, and  soda. It is better to have a nutritious high-calorie food (such as an avocado) than a food with few nutrients (such as a bag of chips).  Know how many calories are in the foods you eat most often. This will help you calculate calorie counts faster.  Pay attention to calories in drinks. Low-calorie drinks include water and unsweetened drinks.  Pay attention to nutrition labels for "low fat" or "fat free" foods. These foods sometimes have the same amount of calories or more calories than the full fat versions. They also often have added sugar, starch, or salt, to make up for flavor that was removed with the fat.  Find a way of tracking calories that works for you. Get creative. Try different apps or programs if writing down calories does not work for you. What are some portion control tips?  Know how many calories are in a serving. This will help you know how many servings of a certain food you can have.  Use a measuring cup to measure serving sizes. You could also try weighing out portions on a kitchen scale. With time, you will be able to estimate serving sizes for some foods.  Take some time to put servings of different foods on your favorite plates, bowls, and cups so you know what a serving looks like.  Try not to eat straight from a bag or box. Doing this can lead to overeating. Put the amount you would like to eat in a cup or on a plate to make sure you are eating the right portion.  Use smaller plates, glasses, and bowls to prevent overeating.  Try not to multitask (for example, watch TV or use your computer) while eating. If it is time to eat, sit down at a table and enjoy your food. This will help you to know when you are full. It will also help you to be aware of what you are eating and how much you are eating. What are tips for following this plan? Reading food labels  Check the calorie count compared to the serving size. The serving size may be smaller than what you are used to  eating.  Check the source of the calories. Make sure the food you are eating is high in vitamins and protein and low in saturated and trans fats. Shopping  Read nutrition labels while you shop. This will help you make healthy decisions before you decide to purchase your food.  Make a grocery list and stick to it. Cooking  Try to cook your favorite foods in a healthier way. For example, try baking instead of frying.  Use low-fat dairy products. Meal planning  Use more fruits and vegetables. Half of your plate should be fruits   and vegetables.  Include lean proteins like poultry and fish. How do I count calories when eating out?  Ask for smaller portion sizes.  Consider sharing an entree and sides instead of getting your own entree.  If you get your own entree, eat only half. Ask for a box at the beginning of your meal and put the rest of your entree in it so you are not tempted to eat it.  If calories are listed on the menu, choose the lower calorie options.  Choose dishes that include vegetables, fruits, whole grains, low-fat dairy products, and lean protein.  Choose items that are boiled, broiled, grilled, or steamed. Stay away from items that are buttered, battered, fried, or served with cream sauce. Items labeled "crispy" are usually fried, unless stated otherwise.  Choose water, low-fat milk, unsweetened iced tea, or other drinks without added sugar. If you want an alcoholic beverage, choose a lower calorie option such as a glass of wine or light beer.  Ask for dressings, sauces, and syrups on the side. These are usually high in calories, so you should limit the amount you eat.  If you want a salad, choose a garden salad and ask for grilled meats. Avoid extra toppings like bacon, cheese, or fried items. Ask for the dressing on the side, or ask for olive oil and vinegar or lemon to use as dressing.  Estimate how many servings of a food you are given. For example, a serving of  cooked rice is  cup or about the size of half a baseball. Knowing serving sizes will help you be aware of how much food you are eating at restaurants. The list below tells you how big or small some common portion sizes are based on everyday objects: ? 1 oz--4 stacked dice. ? 3 oz--1 deck of cards. ? 1 tsp--1 die. ? 1 Tbsp-- a ping-pong ball. ? 2 Tbsp--1 ping-pong ball. ?  cup-- baseball. ? 1 cup--1 baseball. Summary  Calorie counting means keeping track of how many calories you eat and drink each day. If you eat fewer calories than your body needs, you should lose weight.  A healthy amount of weight to lose per week is usually 1-2 lb (0.5-0.9 kg). This usually means reducing your daily calorie intake by 500-750 calories.  The number of calories in a food can be found on a Nutrition Facts label. If a food does not have a Nutrition Facts label, try to look up the calories online or ask your dietitian for help.  Use your calories on foods and drinks that will fill you up, and not on foods and drinks that will leave you hungry.  Use smaller plates, glasses, and bowls to prevent overeating. This information is not intended to replace advice given to you by your health care provider. Make sure you discuss any questions you have with your health care provider. Document Revised: 08/07/2018 Document Reviewed: 10/18/2016 Elsevier Patient Education  2020 [employer].  Exercising to Lose Weight Exercise is structured, repetitive physical activity to improve fitness and health. Getting regular exercise is important for everyone. It is especially important if you are overweight. Being overweight increases your risk of heart disease, stroke, diabetes, high blood pressure, and several types of cancer. Reducing your calorie intake and exercising can help you lose weight. Exercise is usually categorized as moderate or vigorous intensity. To lose weight, most people need to do a certain amount of  moderate-intensity or vigorous-intensity exercise each week. Moderate-intensity exercise  Moderate-intensity  exercise is any activity that gets you moving enough to burn at least three times more energy (calories) than if you were sitting. Examples of moderate exercise include:  Walking a mile in 15 minutes.  Doing light yard work.  Biking at an easy pace. Most people should get at least 150 minutes (2 hours and 30 minutes) a week of moderate-intensity exercise to maintain their body weight. Vigorous-intensity exercise Vigorous-intensity exercise is any activity that gets you moving enough to burn at least six times more calories than if you were sitting. When you exercise at this intensity, you should be working hard enough that you are not able to carry on a conversation. Examples of vigorous exercise include:  Running.  Playing a team sport, such as football, basketball, and soccer.  Jumping rope. Most people should get at least 75 minutes (1 hour and 15 minutes) a week of vigorous-intensity exercise to maintain their body weight. How can exercise affect me? When you exercise enough to burn more calories than you eat, you lose weight. Exercise also reduces body fat and builds muscle. The more muscle you have, the more calories you burn. Exercise also:  Improves mood.  Reduces stress and tension.  Improves your overall fitness, flexibility, and endurance.  Increases bone strength. The amount of exercise you need to lose weight depends on:  Your age.  The type of exercise.  Any health conditions you have.  Your overall physical ability. Talk to your health care provider about how much exercise you need and what types of activities are safe for you. What actions can I take to lose weight? Nutrition   Make changes to your diet as told by your health care provider or diet and nutrition specialist (dietitian). This may include: ? Eating fewer calories. ? Eating more  protein. ? Eating less unhealthy fats. ? Eating a diet that includes fresh fruits and vegetables, whole grains, low-fat dairy products, and lean protein. ? Avoiding foods with added fat, salt, and sugar.  Drink plenty of water while you exercise to prevent dehydration or heat stroke. Activity  Choose an activity that you enjoy and set realistic goals. Your health care provider can help you make an exercise plan that works for you.  Exercise at a moderate or vigorous intensity most days of the week. ? The intensity of exercise may vary from person to person. You can tell how intense a workout is for you by paying attention to your breathing and heartbeat. Most people will notice their breathing and heartbeat get faster with more intense exercise.  Do resistance training twice each week, such as: ? Push-ups. ? Sit-ups. ? Lifting weights. ? Using resistance bands.  Getting short amounts of exercise can be just as helpful as long structured periods of exercise. If you have trouble finding time to exercise, try to include exercise in your daily routine. ? Get up, stretch, and walk around every 30 minutes throughout the day. ? Go for a walk during your lunch break. ? Park your car farther away from your destination. ? If you take public transportation, get off one stop early and walk the rest of the way. ? Make phone calls while standing up and walking around. ? Take the stairs instead of elevators or escalators.  Wear comfortable clothes and shoes with good support.  Do not exercise so much that you hurt yourself, feel dizzy, or get very short of breath. Where to find more information  U[place] Department of Health and Human  Services: [employer]at  Centers for Disease Control and Prevention (CDC): http://www.wolf.info/ Contact a health care provider:  Before starting a new exercise program.  If you have questions or concerns about your weight.  If you have a medical problem that keeps you from  exercising. Get help right away if you have any of the following while exercising:  Injury.  Dizziness.  Difficulty breathing or shortness of breath that does not go away when you stop exercising.  Chest pain.  Rapid heartbeat. Summary  Being overweight increases your risk of heart disease, stroke, diabetes, high blood pressure, and several types of cancer.  Losing weight happens when you burn more calories than you eat.  Reducing the amount of calories you eat in addition to getting regular moderate or vigorous exercise each week helps you lose weight. This information is not intended to replace advice given to you by your health care provider. Make sure you discuss any questions you have with your health care provider. Document Revised: 12/01/2017 Document Reviewed: 12/01/2017 Elsevier Patient Education  [place].  Preventing Unhealthy [employer], Adult Staying at a healthy weight is important to your overall health. When fat builds up in your body, you may become overweight or obese. Being overweight or obese increases your risk of developing certain health problems, such as heart disease, diabetes, sleeping problems, joint problems, and some types of cancer. Unhealthy weight gain is often the result of making unhealthy food choices or not getting enough exercise. You can make changes to your lifestyle to prevent obesity and stay as healthy as possible. What nutrition changes can be made?   Eat only as much as your body needs. To do this: ? Pay attention to signs that you are hungry or full. Stop eating as soon as you feel full. ? If you feel hungry, try drinking water first before eating. Drink enough water so your urine is clear or pale yellow. ? Eat smaller portions. Pay attention to portion sizes when eating out. ? Look at serving sizes on food labels. Most foods contain more than one serving per container. ? Eat the recommended number of calories for your gender  and activity level. For most active people, a daily total of 2,000 calories is appropriate. If you are trying to lose weight or are not very active, you may need to eat fewer calories. Talk with your health care provider or a diet and nutrition specialist (dietitian) about how many calories you need each day.  Choose healthy foods, such as: ? Fruits and vegetables. At each meal, try to fill at least half of your plate with fruits and vegetables. ? Whole grains, such as whole-wheat bread, brown rice, and quinoa. ? Lean meats, such as chicken or fish. ? Other healthy proteins, such as beans, eggs, or tofu. ? Healthy fats, such as nuts, seeds, fatty fish, and olive oil. ? Low-fat or fat-free dairy products.  Check food labels, and avoid food and drinks that: ? Are high in calories. ? Have added sugar. ? Are high in sodium. ? Have saturated fats or trans fats.  Cook foods in healthier ways, such as by baking, broiling, or grilling.  Make a meal plan for the week, and shop with a grocery list to help you stay on track with your purchases. Try to avoid going to the grocery store when you are hungry.  When grocery shopping, try to shop around the outside of the store first, where the fresh foods are. Doing this helps  you to avoid prepackaged foods, which can be high in sugar, salt (sodium), and fat. What lifestyle changes can be made?   Exercise for 30 or more minutes on 5 or more days each week. Exercising may include brisk walking, yard work, biking, running, swimming, and team sports like basketball and soccer. Ask your health care provider which exercises are safe for you.  Do muscle-strengthening activities, such as lifting weights or using resistance bands, on 2 or more days a week.  Do not use any products that contain nicotine or tobacco, such as cigarettes and e-cigarettes. If you need help quitting, ask your health care provider.  Limit alcohol intake to no more than 1 drink a day  for nonpregnant women and 2 drinks a day for men. One drink equals 12 oz of beer, 5 oz of wine, or 1 oz of hard liquor.  Try to get 7-9 hours of sleep each night. What other changes can be made?  Keep a food and activity journal to keep track of: ? What you ate and how many calories you had. Remember to count the calories in sauces, dressings, and side dishes. ? Whether you were active, and what exercises you did. ? Your calorie, weight, and activity goals.  Check your weight regularly. Track any changes. If you notice you have gained weight, make changes to your diet or activity routine.  Avoid taking weight-loss medicines or supplements. Talk to your health care provider before starting any new medicine or supplement.  Talk to your health care provider before trying any new diet or exercise plan. Why are these changes important? Eating healthy, staying active, and having healthy habits can help you to prevent obesity. Those changes also:  Help you manage stress and emotions.  Help you connect with friends and family.  Improve your self-esteem.  Improve your sleep.  Prevent long-term health problems. What can happen if changes are not made? Being obese or overweight can cause you to develop joint or bone problems, which can make it hard for you to stay active or do activities you enjoy. Being obese or overweight also puts stress on your heart and lungs and can lead to health problems like diabetes, heart disease, and some cancers. Where to find more information Talk with your health care provider or a dietitian about healthy eating and healthy lifestyle choices. You may also find information from:  [place] Department of Agriculture, [employer]: [employer].no  American Heart Association: www.heart.org  Centers for Disease Control and Prevention: http://www.wolf.info/ Summary  Staying at a healthy weight is important to your overall health. It helps you to prevent certain diseases and  health problems, such as heart disease, diabetes, joint problems, sleep disorders, and some types of cancer.  Being obese or overweight can cause you to develop joint or bone problems, which can make it hard for you to stay active or do activities you enjoy.  You can prevent unhealthy weight gain by eating a healthy diet, exercising regularly, not smoking, limiting alcohol, and getting enough sleep.  Talk with your health care provider or a dietitian for guidance about healthy eating and healthy lifestyle choices. This information is not intended to replace advice given to you by your health care provider. Make sure you discuss any questions you have with your health care provider. Document Revised: 11/21/2017 Document Reviewed: 12/25/2016 Elsevier Patient Education  2020 [employer].

## 2020-11-02 NOTE — Progress Notes (Signed)
Subjective:    Patient ID: Brenda Acosta, female    DOB: 04-01-1976, 44 y.o.   MRN: [id number]  No chief complaint on file.   HPI Pt is a 44 yo female with pmh sig for HTN, DM II, HLD, PCOS, s/p gastric sleeve with complications, s/p R partial oophorectomy, vegan diet who was seen today for f/u.  Pt on Saxenda for weight loss and DM control.  Requesting refill.  Weight was 196 lbs, currently 180 lbs.   Pt weight 330 lbs prior to gastric sleeve procedure.    Past Medical History:  Diagnosis Date  . Chest pain    a. 09/2015 Ex MV: EF >55%, no ischemia/infarct.  . Diabetes mellitus without complication ([place])    a. 11/20/2015 A1c 11.5;  b.  noncompliant w/ diabetes meds, glucose checks, f/u, etc.  . Dyspnea on exertion    a. 09/2015 Echo: Ef 60-65%, no rwma, mild AI, mildly dil LA.  . Essential hypertension   . Hyperlipidemia   . Morbid obesity ([place])   . Mucinous cystadenoma of ovary    Hx of   . Noncompliance     Allergies  Allergen Reactions  . Benzoyl Peroxide Other (See Comments)    Burns     ROS General: Denies fever, chills, night sweats, changes in appetite  +changes in weight HEENT: Denies headaches, ear pain, changes in vision, rhinorrhea, sore throat CV: Denies CP, palpitations, SOB, orthopnea Pulm: Denies SOB, cough, wheezing GI: Denies abdominal pain, nausea, vomiting, diarrhea, constipation GU: Denies dysuria, hematuria, frequency, vaginal discharge Msk: Denies muscle cramps, joint pains Neuro: Denies weakness, numbness, tingling Skin: Denies rashes, bruising Psych: Denies depression, anxiety, hallucinations     Objective:    Blood pressure 118/64, pulse 68, temperature 98.8 F (37.1 C), temperature source Oral, weight 180 lb 9.6 oz (81.9 kg), SpO2 98 %.  Gen. Pleasant, well-nourished, in no distress, normal affect   HEENT: [place]/AT, face symmetric, conjunctiva clear, no scleral icterus, PERRLA, EOMI, nares patent without drainage Lungs: no accessory muscle  use, CTAB, no wheezes or rales Cardiovascular: RRR, no m/r/g, no peripheral edema Musculoskeletal: No deformities, no cyanosis or clubbing, normal tone Neuro:  A&Ox3, CN II-XII intact, normal gait Skin:  Warm, no lesions/ rash   Wt Readings from Last 3 Encounters:  04/10/20 196 lb (88.9 kg)  03/16/20 195 lb (88.5 kg)  03/08/20 193 lb (87.5 kg)    Lab Results  Component Value Date   WBC 9.0 03/16/2020   HGB 12.5 03/16/2020   HCT 37.5 03/16/2020   PLT 547.0 (H) 03/16/2020   GLUCOSE 127 (H) 03/16/2020   CHOL 237 (H) 03/16/2020   TRIG 140.0 03/16/2020   HDL 46.00 03/16/2020   LDLCALC 163 (H) 03/16/2020   ALT 12 03/16/2020   AST 14 03/16/2020   NA 139 03/16/2020   K 4.5 03/16/2020   CL 103 03/16/2020   CREATININE 0.95 03/16/2020   BUN 20 03/16/2020   CO2 27 03/16/2020   TSH 0.98 03/16/2020   HGBA1C 7.6 (H) 03/16/2020   MICROALBUR <0.7 03/16/2020    Assessment/Plan:  Class 1 obesity with serious comorbidity and body mass index (BMI) of 31.0 to 31.9 in adult, unspecified obesity type  -no longer obese as BMI 29.15 this visit. -s/p gastric sleeve with complications -continue vegan diet -continue lifestyle modifications -Was 196 lbs prior to starting liraglutide, currently 180 lbs -Continue Saxenda 3 mg daily.  Discussed decreasing medicaiton/ d/c'ing. - Plan: Lipid panel, Hemoglobin A1c, Vitamin D, 25-hydroxy, Vitamin  B12, TSH, T4, free, CBC with Differential/Platelet, Liraglutide -Weight Management (SAXENDA) 18 MG/3ML SOPN  Type 2 diabetes mellitus with other ophthalmic complication, without long-term current use of insulin (HCC)  -hgb A1C 7.6% on 03/16/20 -Continue liraglutide 3 mg daily for weight management, but also effective for DM -We will obtain hemoglobin A1c - Plan: Lipid panel, Hemoglobin A1c, CBC with Differential/Platelet, Liraglutide -Weight Management (SAXENDA) 18 MG/3ML SOPN  Vegan diet -We will obtain labs -Continue daily multivitamin especially given  history of gastric sleeve. - Plan: CMP with eGFR([facility]), Vitamin D, 25-hydroxy, Vitamin B12, CBC with Differential/Platelet  F/u in the next 3-4 months for CPE  Grier Mitts, MD

## 2020-11-03 LAB — LIPID PANEL
Cholesterol: 215 mg/dL — ABNORMAL HIGH (ref <200)
HDL: 44 mg/dL — ABNORMAL LOW (ref >=50)
LDL Cholesterol (Calc): 152 mg/dL (calc) — ABNORMAL HIGH
Non-HDL Cholesterol (Calc): 171 mg/dL (calc) — ABNORMAL HIGH (ref <130)
Total CHOL/HDL Ratio: 4.9 (calc) (ref <5.0)
Triglycerides: 87 mg/dL (ref <150)

## 2020-11-03 LAB — CBC WITH DIFFERENTIAL/PLATELET
Absolute Monocytes: 420 cells/uL (ref 200–950)
Basophils Absolute: 28 cells/uL (ref 0–200)
Basophils Relative: 0.5 %
Eosinophils Absolute: 190 cells/uL (ref 15–500)
Eosinophils Relative: 3.4 %
HCT: 36.2 % (ref 35.0–45.0)
Hemoglobin: 12.4 g/dL (ref 11.7–15.5)
Lymphs Abs: 1921 cells/uL (ref 850–3900)
MCH: 30.7 pg (ref 27.0–33.0)
MCHC: 34.3 g/dL (ref 32.0–36.0)
MCV: 89.6 fL (ref 80.0–100.0)
MPV: 8.8 fL (ref 7.5–12.5)
Monocytes Relative: 7.5 %
Neutro Abs: 3041 cells/uL (ref 1500–7800)
Neutrophils Relative %: 54.3 %
Platelets: 459 10*3/uL — ABNORMAL HIGH (ref 140–400)
RBC: 4.04 10*6/uL (ref 3.80–5.10)
RDW: 11.9 % (ref 11.0–15.0)
Total Lymphocyte: 34.3 %
WBC: 5.6 10*3/uL (ref 3.8–10.8)

## 2020-11-03 LAB — COMPLETE METABOLIC PANEL WITH GFR
AG Ratio: 1.3 (calc) (ref 1.0–2.5)
ALT: 8 U/L (ref 6–29)
AST: 13 U/L (ref 10–30)
Albumin: 3.8 g/dL (ref 3.6–5.1)
Alkaline phosphatase (APISO): 34 U/L (ref 31–125)
BUN/Creatinine Ratio: 16 (calc) (ref 6–22)
BUN: 18 mg/dL (ref 7–25)
CO2: 20 mmol/L (ref 20–32)
Calcium: 9.4 mg/dL (ref 8.6–10.2)
Chloride: 106 mmol/L (ref 98–110)
Creat: 1.15 mg/dL — ABNORMAL HIGH (ref 0.50–1.10)
GFR, Est African American: 67 mL/min/{1.73_m2} (ref >=60)
GFR, Est Non African American: 58 mL/min/{1.73_m2} — ABNORMAL LOW (ref >=60)
Globulin: 3 g/dL (calc) (ref 1.9–3.7)
Glucose, Bld: 91 mg/dL (ref 65–99)
Potassium: 4.6 mmol/L (ref 3.5–5.3)
Sodium: 141 mmol/L (ref 135–146)
Total Bilirubin: 0.3 mg/dL (ref 0.2–1.2)
Total Protein: 6.8 g/dL (ref 6.1–8.1)

## 2020-11-03 LAB — VITAMIN B12: Vitamin B-12: 384 pg/mL (ref 200–1100)

## 2020-11-03 LAB — HEMOGLOBIN A1C
Hgb A1c MFr Bld: 5.8 % of total Hgb — ABNORMAL HIGH (ref <5.7)
Mean Plasma Glucose: 120 (calc)
eAG (mmol/L): 6.6 (calc)

## 2020-11-03 LAB — TSH: TSH: 1.14 mIU/L

## 2020-11-03 LAB — T4, FREE: Free T4: 1.3 ng/dL (ref 0.8–1.8)

## 2020-11-03 LAB — VITAMIN D 25 HYDROXY (VIT D DEFICIENCY, FRACTURES): Vit D, 25-Hydroxy: 39 ng/mL (ref 30–100)

## 2020-11-06 ENCOUNTER — Encounter: Payer: Self-pay | Admitting: Family Medicine

## 2020-11-12 ENCOUNTER — Encounter: Payer: Self-pay | Admitting: Family Medicine

## 2020-11-12 DIAGNOSIS — D75839 Thrombocytosis, unspecified: Secondary | ICD-10-CM | POA: Insufficient documentation

## 2020-11-17 ENCOUNTER — Telehealth: Payer: Self-pay

## 2020-11-17 NOTE — Telephone Encounter (Signed)
Prior Authorization for Brenda Acosta was submitted to pt insurance. Awaiting for approval

## 2020-11-23 NOTE — Telephone Encounter (Signed)
Pt is calling in to check the status of the PA and she is aware that we are waiting on the insurance company to approve the medication.  Pt is aware that some time it takes a little time for the insurance to approve b/c they may be checking numerous things before approval or they may be short staffed due to the holiday season, but we are waiting on them to send the approval back.

## 2020-11-27 NOTE — Telephone Encounter (Signed)
PA approved, see mychart encounter.

## 2020-12-13 DIAGNOSIS — N920 Excessive and frequent menstruation with regular cycle: Secondary | ICD-10-CM | POA: Diagnosis not present

## 2020-12-13 DIAGNOSIS — N939 Abnormal uterine and vaginal bleeding, unspecified: Secondary | ICD-10-CM | POA: Diagnosis not present

## 2021-01-02 ENCOUNTER — Other Ambulatory Visit: Payer: Self-pay | Admitting: Family Medicine

## 2021-01-02 DIAGNOSIS — I10 Essential (primary) hypertension: Secondary | ICD-10-CM

## 2021-01-02 DIAGNOSIS — N939 Abnormal uterine and vaginal bleeding, unspecified: Secondary | ICD-10-CM | POA: Diagnosis not present

## 2021-01-17 ENCOUNTER — Telehealth: Payer: Self-pay | Admitting: Physician Assistant

## 2021-01-17 NOTE — Telephone Encounter (Signed)
Patient has been contacted at least 3 times for a recall, recall has been deleted

## 2021-02-09 DIAGNOSIS — N920 Excessive and frequent menstruation with regular cycle: Secondary | ICD-10-CM | POA: Diagnosis not present

## 2021-02-14 DIAGNOSIS — Z03818 Encounter for observation for suspected exposure to other biological agents ruled out: Secondary | ICD-10-CM | POA: Diagnosis not present

## 2021-03-13 DIAGNOSIS — Z03818 Encounter for observation for suspected exposure to other biological agents ruled out: Secondary | ICD-10-CM | POA: Diagnosis not present

## 2021-04-03 DIAGNOSIS — Z03818 Encounter for observation for suspected exposure to other biological agents ruled out: Secondary | ICD-10-CM | POA: Diagnosis not present

## 2021-04-05 ENCOUNTER — Other Ambulatory Visit: Payer: Self-pay | Admitting: Family Medicine

## 2021-04-05 DIAGNOSIS — I10 Essential (primary) hypertension: Secondary | ICD-10-CM

## 2021-06-02 DIAGNOSIS — Z20822 Contact with and (suspected) exposure to covid-19: Secondary | ICD-10-CM | POA: Diagnosis not present

## 2021-06-02 DIAGNOSIS — Z03818 Encounter for observation for suspected exposure to other biological agents ruled out: Secondary | ICD-10-CM | POA: Diagnosis not present

## 2021-06-21 ENCOUNTER — Encounter: Payer: Self-pay | Admitting: Family Medicine

## 2021-06-22 ENCOUNTER — Encounter: Payer: Self-pay | Admitting: Family Medicine

## 2021-06-25 ENCOUNTER — Encounter: Payer: Self-pay | Admitting: Family Medicine

## 2021-06-25 ENCOUNTER — Telehealth (INDEPENDENT_AMBULATORY_CARE_PROVIDER_SITE_OTHER): Payer: BC Managed Care – PPO | Admitting: Family Medicine

## 2021-06-25 DIAGNOSIS — F419 Anxiety disorder, unspecified: Secondary | ICD-10-CM | POA: Diagnosis not present

## 2021-06-25 DIAGNOSIS — F321 Major depressive disorder, single episode, moderate: Secondary | ICD-10-CM | POA: Diagnosis not present

## 2021-06-25 MED ORDER — HYDROXYZINE HCL 25 MG PO TABS: 25.0000 mg | ORAL_TABLET | Freq: Every evening | ORAL | 0 refills | Status: DC | PRN

## 2021-06-25 MED ORDER — PAROXETINE HCL 20 MG PO TABS: 20.0000 mg | ORAL_TABLET | Freq: Every day | ORAL | 1 refills | Status: DC

## 2021-06-25 NOTE — Progress Notes (Signed)
Virtual Visit via Video Note  I connected with Brenda Acosta on 06/25/21 at  4:00 PM EDT by a video enabled telemedicine application 2/2 [id number] pandemic and verified that I am speaking with the correct person using two identifiers.  Location patient: home Location provider:work or home office Persons participating in the virtual visit: patient, provider  I discussed the limitations of evaluation and management by telemedicine and the availability of in person appointments. The patient expressed understanding and agreed to proceed. Chief Complaint  Patient presents with   Anxiety    Started in 2020, but states it has gotten worse over last 3 weeks. Prevents her from sleeping and sometimes wakes her up at night. States she uses the calm app, deep sleep sessions, and anxiety sessions  to try and help.     HPI: Pt with increased anxiety, but can't pinpoint cause.  Symptoms started at the beginning of the pandemic.  Was able to manage with meditation, calm app, and prayer, no longer working in the last 2 wks.  Waking up in the middle of night unable to go back to sleep.  Has gone to the office at 3 am as she was up.  Stopped therapy during the pandemic, but scheduled an appt for next wk.  Several events compounding stress: the anniversary of her mom's death, her birthday, work, and her sister dealing with life stress.  Pt notes change in appetite, eating less during the wk and more on weekends.  Others have not noticed.  Does not want her 45 yo son to notice.  ROS: See pertinent positives and negatives per HPI.  Past Medical History:  Diagnosis Date   Chest pain    a. 09/2015 Ex MV: EF >55%, no ischemia/infarct.   Diabetes mellitus without complication ([place])    a. 11/20/2015 A1c 11.5;  b.  noncompliant w/ diabetes meds, glucose checks, f/u, etc.   Dyspnea on exertion    a. 09/2015 Echo: Ef 60-65%, no rwma, mild AI, mildly dil LA.   Essential hypertension    Hyperlipidemia    Morbid  obesity ([place])    Mucinous cystadenoma of ovary    Hx of    Noncompliance     Past Surgical History:  Procedure Laterality Date   ABDOMINAL HYSTERECTOMY     partial hyst   MOUTH SURGERY     OOPHORECTOMY  07/2006   Rt; large benign ovarian tumor    Family History  Problem Relation Age of Onset   Heart disease Mother 58       heart attack   Hypertension Father    Diabetes Father        Type II   Cancer Maternal Grandfather        prostate cancer     Current Outpatient Medications:    BAYER MICROLET LANCETS lancets, Use to test blood sugar 2 time daily as instructed., Disp: 100 each, Rfl: 5   Blood Glucose Monitoring Suppl (BAYER CONTOUR NEXT MONITOR) w/Device KIT, Use to test blood sugar daily., Disp: 1 kit, Rfl: 0   CONTOUR NEXT TEST test strip, USE 1 STRIP TO CHECK GLUCOSE TWICE DAILY AS DIRECTED, Disp: 100 each, Rfl: 0   furosemide (LASIX) 20 MG tablet, Take one tab daily prn for lower extremity edema, Disp: 30 tablet, Rfl: 3   ibuprofen (ADVIL,MOTRIN) 600 MG tablet, Take 600 mg by mouth every 6 (six) hours as needed for pain., Disp: , Rfl:    ibuprofen (ADVIL,MOTRIN) 600 MG tablet, Take 1 tablet (  600 mg total) by mouth every 8 (eight) hours as needed., Disp: 15 tablet, Rfl: 0   Insulin Pen Needle (PEN NEEDLES) 32G X 5 MM MISC, For daily use., Disp: 100 each, Rfl: 2   Liraglutide -Weight Management (SAXENDA) 18 MG/3ML SOPN, Inject 3 mg into the skin daily. Use as directed.  Inject 0.6 mg daily x1 week.  Increase dose by 0.6 mg weekly.  Max dose 3 mg/day., Disp: 72 mL, Rfl: 1   lisinopril-hydrochlorothiazide (ZESTORETIC) 20-25 MG tablet, Take 1 tablet by mouth once daily, Disp: 90 tablet, Rfl: 0  EXAM:  VITALS per patient if applicable:  RR between 12-20 bpm  GENERAL: alert, oriented, appears well and in no acute distress  HEENT: atraumatic, conjunctiva clear, no obvious abnormalities on inspection of external nose and ears  NECK: normal movements of the head and  neck  LUNGS: on inspection no signs of respiratory distress, breathing rate appears normal, no obvious gross SOB, gasping or wheezing  CV: no obvious cyanosis  MS: moves all visible extremities without noticeable abnormality  PSYCH/NEURO: pleasant and cooperative, no obvious depression or anxiety, speech and thought processing grossly intact  ASSESSMENT AND PLAN:  Discussed the following assessment and plan:  Depression, major, single episode, moderate (HCC)  -PHQ 9 Acosta 12 -restart counseling -continue self care -start paxil 20 mg daily -given strict precautions - Plan: PARoxetine (PAXIL) 20 MG tablet  Anxiety  -GAD 7 Acosta 12 -restart counseling -Continue self-care -Discussed hydroxyzine 12.5-25 mg nightly as needed. -Starting Paxil 20 mg daily -Given strict precautions - Plan: hydrOXYzine (ATARAX/VISTARIL) 25 MG tablet, PARoxetine (PAXIL) 20 MG tablet  Follow-up in 4-6 weeks, sooner if needed   I discussed the assessment and treatment plan with the patient. The patient was provided an opportunity to ask questions and all were answered. The patient agreed with the plan and demonstrated an understanding of the instructions.   The patient was advised to call back or seek an in-person evaluation if the symptoms worsen or if the condition fails to improve as anticipated.   [name], MD

## 2021-06-27 ENCOUNTER — Other Ambulatory Visit: Payer: Self-pay

## 2021-06-27 DIAGNOSIS — L709 Acne, unspecified: Secondary | ICD-10-CM

## 2021-06-27 DIAGNOSIS — L905 Scar conditions and fibrosis of skin: Secondary | ICD-10-CM

## 2021-06-29 DIAGNOSIS — F411 Generalized anxiety disorder: Secondary | ICD-10-CM | POA: Diagnosis not present

## 2021-07-04 DIAGNOSIS — F411 Generalized anxiety disorder: Secondary | ICD-10-CM | POA: Diagnosis not present

## 2021-07-09 ENCOUNTER — Other Ambulatory Visit: Payer: Self-pay | Admitting: Family Medicine

## 2021-07-09 DIAGNOSIS — E118 Type 2 diabetes mellitus with unspecified complications: Secondary | ICD-10-CM

## 2021-07-09 DIAGNOSIS — I10 Essential (primary) hypertension: Secondary | ICD-10-CM

## 2021-07-11 DIAGNOSIS — L700 Acne vulgaris: Secondary | ICD-10-CM | POA: Diagnosis not present

## 2021-07-12 DIAGNOSIS — F411 Generalized anxiety disorder: Secondary | ICD-10-CM | POA: Diagnosis not present

## 2021-07-18 DIAGNOSIS — F411 Generalized anxiety disorder: Secondary | ICD-10-CM | POA: Diagnosis not present

## 2021-07-23 ENCOUNTER — Other Ambulatory Visit: Payer: Self-pay | Admitting: Family Medicine

## 2021-07-23 DIAGNOSIS — E118 Type 2 diabetes mellitus with unspecified complications: Secondary | ICD-10-CM

## 2021-07-25 DIAGNOSIS — F411 Generalized anxiety disorder: Secondary | ICD-10-CM | POA: Diagnosis not present

## 2021-08-01 DIAGNOSIS — F411 Generalized anxiety disorder: Secondary | ICD-10-CM | POA: Diagnosis not present

## 2021-08-06 ENCOUNTER — Encounter: Payer: Self-pay | Admitting: Family Medicine

## 2021-08-07 ENCOUNTER — Other Ambulatory Visit: Payer: Self-pay | Admitting: Family Medicine

## 2021-08-07 DIAGNOSIS — E118 Type 2 diabetes mellitus with unspecified complications: Secondary | ICD-10-CM

## 2021-08-08 DIAGNOSIS — F411 Generalized anxiety disorder: Secondary | ICD-10-CM | POA: Diagnosis not present

## 2021-08-15 ENCOUNTER — Ambulatory Visit (INDEPENDENT_AMBULATORY_CARE_PROVIDER_SITE_OTHER): Payer: BC Managed Care – PPO | Admitting: Family Medicine

## 2021-08-15 ENCOUNTER — Other Ambulatory Visit: Payer: Self-pay

## 2021-08-15 ENCOUNTER — Encounter: Payer: Self-pay | Admitting: Family Medicine

## 2021-08-15 VITALS — BP 120/88 | HR 80 | Temp 98.5°F | Wt 186.4 lb

## 2021-08-15 DIAGNOSIS — Z1211 Encounter for screening for malignant neoplasm of colon: Secondary | ICD-10-CM | POA: Diagnosis not present

## 2021-08-15 DIAGNOSIS — K5900 Constipation, unspecified: Secondary | ICD-10-CM | POA: Diagnosis not present

## 2021-08-15 DIAGNOSIS — F411 Generalized anxiety disorder: Secondary | ICD-10-CM | POA: Diagnosis not present

## 2021-08-15 MED ORDER — SENNOSIDES-DOCUSATE SODIUM 8.6-50 MG PO TABS: 1.0000 | ORAL_TABLET | Freq: Every day | ORAL | 1 refills | Status: DC

## 2021-08-15 NOTE — Progress Notes (Signed)
Subjective:    Patient ID: Brenda Acosta, female    DOB: 1976/06/14, 45 y.o.   MRN: [id number]  Chief Complaint  Patient presents with   swelling    Rectum swelling, constipated a few weeks, and it feels swollen on the inside of rectum. States it is not hemorrhoids, not painful. Had small BM this morning     HPI Patient was seen today for acute concern.  Patient endorses episodes of constipation over the last 3 weeks.  Endorses urge to have a BM but not feeling like she is completely emptying her bowels.  Patient endorses swelling around anus and rectum.  Does not feel like typical hemorrhoids.  Endorses feeling bloated.  Eating mostly plant-based diet.  Not drinking much water throughout the day.  Denies bleeding.  Took castor oil Monday am, had small BM this am.  Also tried tea with senna.  Past Medical History:  Diagnosis Date   Chest pain    a. 09/2015 Ex MV: EF >55%, no ischemia/infarct.   Diabetes mellitus without complication ([place])    a. 11/20/2015 A1c 11.5;  b.  noncompliant w/ diabetes meds, glucose checks, f/u, etc.   Dyspnea on exertion    a. 09/2015 Echo: Ef 60-65%, no rwma, mild AI, mildly dil LA.   Essential hypertension    Hyperlipidemia    Morbid obesity (HCC)    Mucinous cystadenoma of ovary    Hx of    Noncompliance     Allergies  Allergen Reactions   Benzoyl Peroxide Other (See Comments)    Burns     ROS General: Denies fever, chills, night sweats, changes in weight, changes in appetite HEENT: Denies headaches, ear pain, changes in vision, rhinorrhea, sore throat CV: Denies CP, palpitations, SOB, orthopnea Pulm: Denies SOB, cough, wheezing GI: Denies abdominal pain, nausea, vomiting, diarrhea +constipation, bloating, rectal edema GU: Denies dysuria, hematuria, frequency, vaginal discharge Msk: Denies muscle cramps, joint pains Neuro: Denies weakness, numbness, tingling Skin: Denies rashes, bruising Psych: Denies depression, anxiety,  hallucinations  Objective:    Blood pressure 120/88, pulse 80, temperature 98.5 F (36.9 C), temperature source Oral, weight 186 lb 6.4 oz (84.6 kg).  Gen. Pleasant, well-nourished, in no distress, normal affect   HEENT: [place]/AT, face symmetric, conjunctiva clear, no scleral icterus, PERRLA, EOMI, nares patent without drainage Lungs: no accessory muscle use Cardiovascular: RRR, no peripheral edema Abdomen: BS present, soft, NT/ND [id number] external female genitalia, no external hemorrhoids, normal rectal tone, no stool in rectal vault. Guaiac negative. Musculoskeletal: No deformities, no cyanosis or clubbing, normal tone Neuro:  A&Ox3, CN II-XII intact, normal gait Skin:  Warm, no lesions/ rash  Wt Readings from Last 3 Encounters:  08/15/21 186 lb 6.4 oz (84.6 kg)  11/02/20 180 lb 9.6 oz (81.9 kg)  04/10/20 196 lb (88.9 kg)    Lab Results  Component Value Date   WBC 5.6 11/02/2020   HGB 12.4 11/02/2020   HCT 36.2 11/02/2020   PLT 459 (H) 11/02/2020   GLUCOSE 91 11/02/2020   CHOL 215 (H) 11/02/2020   TRIG 87 11/02/2020   HDL 44 (L) 11/02/2020   LDLCALC 152 (H) 11/02/2020   ALT 8 11/02/2020   AST 13 11/02/2020   NA 141 11/02/2020   K 4.6 11/02/2020   CL 106 11/02/2020   CREATININE 1.15 (H) 11/02/2020   BUN 18 11/02/2020   CO2 20 11/02/2020   TSH 1.14 11/02/2020   HGBA1C 5.8 (H) 11/02/2020   MICROALBUR <0.7 03/16/2020  Assessment/Plan:  Constipation, unspecified constipation type  -Pt advised to increase intake of water and fiber -guaiac negative -given handouts - Plan: Ambulatory referral to Gastroenterology, senna-docusate (SENOKOT-S) 8.6-50 MG tablet  Colon cancer screening  - Plan: Ambulatory referral to Gastroenterology  F/u 1 month  Grier Mitts, MD

## 2021-08-21 ENCOUNTER — Encounter: Payer: Self-pay | Admitting: Gastroenterology

## 2021-08-21 ENCOUNTER — Encounter: Payer: Self-pay | Admitting: Family Medicine

## 2021-08-22 DIAGNOSIS — L700 Acne vulgaris: Secondary | ICD-10-CM | POA: Diagnosis not present

## 2021-08-22 DIAGNOSIS — F411 Generalized anxiety disorder: Secondary | ICD-10-CM | POA: Diagnosis not present

## 2021-08-29 DIAGNOSIS — F411 Generalized anxiety disorder: Secondary | ICD-10-CM | POA: Diagnosis not present

## 2021-09-03 ENCOUNTER — Other Ambulatory Visit: Payer: Self-pay | Admitting: Family Medicine

## 2021-09-03 DIAGNOSIS — I10 Essential (primary) hypertension: Secondary | ICD-10-CM

## 2021-09-05 ENCOUNTER — Encounter: Payer: Self-pay | Admitting: Gastroenterology

## 2021-09-05 ENCOUNTER — Ambulatory Visit (INDEPENDENT_AMBULATORY_CARE_PROVIDER_SITE_OTHER): Payer: BC Managed Care – PPO | Admitting: Gastroenterology

## 2021-09-05 VITALS — BP 134/80 | HR 76 | Ht 66.0 in | Wt 195.0 lb

## 2021-09-05 DIAGNOSIS — K5900 Constipation, unspecified: Secondary | ICD-10-CM | POA: Diagnosis not present

## 2021-09-05 DIAGNOSIS — F411 Generalized anxiety disorder: Secondary | ICD-10-CM | POA: Diagnosis not present

## 2021-09-05 DIAGNOSIS — I10 Essential (primary) hypertension: Secondary | ICD-10-CM | POA: Diagnosis not present

## 2021-09-05 DIAGNOSIS — R194 Change in bowel habit: Secondary | ICD-10-CM | POA: Diagnosis not present

## 2021-09-05 DIAGNOSIS — Z1211 Encounter for screening for malignant neoplasm of colon: Secondary | ICD-10-CM

## 2021-09-05 MED ORDER — SUTAB 1479-225-188 MG PO TABS: 1.0000 | ORAL_TABLET | Freq: Once | ORAL | 0 refills | Status: AC

## 2021-09-05 NOTE — Progress Notes (Signed)
HPI :  45 y/o female with a history of DM, HTN, HLD, referred by Grier Mitts MD for constipation and colon cancer screening.  She reports at baseline she has some mild intermittent constipation for which she uses mineral oil or castor oil as needed.  She normally moves her bowels a few times daily.  About 6 weeks ago she endorses an acute change in bowel habits where she had significant straining.  During this time she felt severe swelling in her rectal area but denies any overt prolapse.  She states her rectum was swollen to the "size of a grapefruit".  Since this time she has had a bowel movement about 3-4 times a week which is much less frequency than normal.  She states she has a hard time evacuating herself completely and states she is "backed up".  She has been feeling bloated and full in her abdomen in light of this.  She has not seen any overt blood in her stool.  She is never had any symptoms like this before.  She states the swelling of her rectum has gone back to normal but when she has a bowel movement and if she strains the swelling will recur.  She denies any focal pain to the rectum otherwise or in her abdomen.  She has been using over-the-counter Colace more recently but still feels a sense of incomplete evacuation.  She was prescribed senna but has not started it yet.  She thinks due to lack of bowel movement she has gained weight, perhaps 10 pounds over the past 6 weeks.  He has never had a prior colonoscopy.  No family history of colon cancer.  She denies any new or recent medication changes that started prior to onset of symptoms.  She denies any cardiopulmonary symptoms.  She states she has been trying to lose weight and has been successful over the past few years.  She previously weighed over 300 pounds and is now down to 195.  She has a history of a gastric bypass in February 2019.  She denies any postprandial problems at this time.  She was on Saxenda for help with weight loss but  stopped this.  With her weight loss her diabetes is under much better control and doing well in that regard.  She had a nuclear stress test and echocardiogram in 2016 which was normal.   Echo 09/14/15 - EF 60-65%, otherwise normal  Nuclear stress - 09/04/2015 - EF > 55%, low risk study  Past Medical History:  Diagnosis Date   Chest pain    a. 09/2015 Ex MV: EF >55%, no ischemia/infarct.   Diabetes mellitus without complication ([place])    a. 11/20/2015 A1c 11.5;  b.  noncompliant w/ diabetes meds, glucose checks, f/u, etc.   Dyspnea on exertion    a. 09/2015 Echo: Ef 60-65%, no rwma, mild AI, mildly dil LA.   Essential hypertension    Hyperlipidemia    Morbid obesity ([place])    Mucinous cystadenoma of ovary    Hx of    Noncompliance      Past Surgical History:  Procedure Laterality Date   ABDOMINAL HYSTERECTOMY     partial hyst   LAPAROSCOPIC GASTRIC SLEEVE RESECTION     MOUTH SURGERY     OOPHORECTOMY  07/02/2006   Rt; large benign ovarian tumor   Family History  Problem Relation Age of Onset   Heart disease Mother 47       heart attack   Hypertension  Father    Diabetes Father        Type II   Cancer Maternal Grandfather        prostate cancer   Social History   Tobacco Use   Smoking status: Never   Smokeless tobacco: Never  Substance Use Topics   Alcohol use: No   Drug use: No   Current Outpatient Medications  Medication Sig Dispense Refill   BAYER MICROLET LANCETS lancets Use to test blood sugar 2 time daily as instructed. 100 each 5   Blood Glucose Monitoring Suppl (BAYER CONTOUR NEXT MONITOR) w/Device KIT Use to test blood sugar daily. 1 kit 0   CONTOUR NEXT TEST test strip USE 1 STRIP TO CHECK GLUCOSE TWICE DAILY AS DIRECTED 100 each 0   furosemide (LASIX) 20 MG tablet Take one tab daily prn for lower extremity edema 30 tablet 3   hydrOXYzine (ATARAX/VISTARIL) 25 MG tablet Take 1 tablet (25 mg total) by mouth at bedtime as needed for anxiety. 30 tablet 0    Insulin Pen Needle (PEN NEEDLES) 32G X 5 MM MISC For daily use. 100 each 2   Liraglutide -Weight Management (SAXENDA) 18 MG/3ML SOPN Inject 3 mg into the skin daily. Use as directed.  Inject 0.6 mg daily x1 week.  Increase dose by 0.6 mg weekly.  Max dose 3 mg/day. 72 mL 1   PARoxetine (PAXIL) 20 MG tablet Take 1 tablet (20 mg total) by mouth daily. 30 tablet 1   senna-docusate (SENOKOT-S) 8.6-50 MG tablet Take 1 tablet by mouth daily. 30 tablet 1   Sodium Sulfate-Mag Sulfate-KCl (SUTAB) [phone] MG TABS Take 1 kit by mouth once for 1 dose. 24 tablet 0   lisinopril-hydrochlorothiazide (ZESTORETIC) 20-25 MG tablet Take one tablet daily - Please schedule an appointment for further refills. 90 tablet 0   No current facility-administered medications for this visit.   Allergies  Allergen Reactions   Benzoyl Peroxide Other (See Comments)    Burns      Review of Systems: All systems reviewed and negative except where noted in HPI.   Lab Results  Component Value Date   WBC 5.6 11/02/2020   HGB 12.4 11/02/2020   HCT 36.2 11/02/2020   MCV 89.6 11/02/2020   PLT 459 (H) 11/02/2020    Lab Results  Component Value Date   CREATININE 1.15 (H) 11/02/2020   BUN 18 11/02/2020   NA 141 11/02/2020   K 4.6 11/02/2020   CL 106 11/02/2020   CO2 20 11/02/2020    Lab Results  Component Value Date   ALT 8 11/02/2020   AST 13 11/02/2020   ALKPHOS 37 (L) 03/16/2020   BILITOT 0.3 11/02/2020     Physical Exam: BP 134/80   Pulse 76   Ht 5' 6"  (1.676 m)   Wt 195 lb (88.5 kg)   BMI 31.47 kg/m  Constitutional: Pleasant,well-developed, female in no acute distress. HEENT: Normocephalic and atraumatic. Conjunctivae are normal. No scleral icterus. Neck supple.  Cardiovascular: Normal rate, regular rhythm.  Pulmonary/chest: Effort normal and breath sounds normal. No wheezing, rales or rhonchi. Abdominal: Soft, nondistended, nontender. There are no masses palpable.  DRE - [place]  standby - no external swelling, no mass / polypoid lesions, soft stool in vault. Extremities: no edema Lymphadenopathy: No cervical adenopathy noted. Neurological: Alert and oriented to person place and time. Skin: Skin is warm and dry. No rashes noted. Psychiatric: Normal mood and affect. Behavior is normal.   ASSESSMENT AND PLAN: 45 year old  female here for new patient consultation regarding the following:  Change in bowel habits Constipation Colon cancer screening  As above, relatively acute in onset change in bowel habits with significant worsening of constipation, sense of incomplete evacuation, and rectal swelling without prolapse.  DRE today shows no swelling or prolapse, seems this is more so associated with her bowel movement, otherwise DRE without any mass/polypoid lesions, no evidence of impaction.  No clear changes in medications to precipitate bowel changes.  Discussed options with the patient.  She has never had a colonoscopy and is due for screening regardless, given her symptoms of recommending a colonoscopy to further evaluate.  I discussed risk benefits of colonoscopy and anesthesia with her and she wishes to proceed.  In the interim recommend a MiraLAX purge to help clear her bowel and then take MiraLAX twice daily and titrate up as needed for goal of at least 1 bowel movement per day.  I reviewed how to do this with her and she is agreeable.  All questions answered.  Plan: - Colonoscopy - Miralax bowel purge (half prep to full prep if needed), then one dose BID and titrate up as needed  Further recommendations pending her course and results of her exam.    Jolly Mango, MD [facility]

## 2021-09-05 NOTE — Patient Instructions (Signed)
If you are age 45 or older, your body mass index should be between 23-30. Your Body mass index is 31.47 kg/m. If this is out of the aforementioned range listed, please consider follow up with your Primary Care Provider.  If you are age 3 or younger, your body mass index should be between 19-25. Your Body mass index is 31.47 kg/m. If this is out of the aformentioned range listed, please consider follow up with your Primary Care Provider.   __________________________________________________________  The [facility] providers would like to encourage you to use [facility] to communicate with providers for non-urgent requests or questions.  Due to long hold times on the telephone, sending your provider a message by [facility] may be a faster and more efficient way to get a response.  Please allow 48 business hours for a response.  Please remember that this is for non-urgent requests.    You have been scheduled for a colonoscopy. Please follow written instructions given to you at your visit today.  Please pick up your prep supplies at the pharmacy within the next 1-3 days. If you use inhalers (even only as needed), please bring them with you on the day of your procedure.  Thank you for entrusting me with your care and for choosing [facility], Dr. [place] Cellar

## 2021-09-10 ENCOUNTER — Other Ambulatory Visit: Payer: Self-pay

## 2021-09-10 ENCOUNTER — Ambulatory Visit (AMBULATORY_SURGERY_CENTER): Payer: BC Managed Care – PPO | Admitting: Gastroenterology

## 2021-09-10 ENCOUNTER — Encounter: Payer: Self-pay | Admitting: Gastroenterology

## 2021-09-10 VITALS — BP 119/78 | HR 67 | Temp 97.5°F | Resp 14 | Ht 66.0 in | Wt 195.0 lb

## 2021-09-10 DIAGNOSIS — Z1211 Encounter for screening for malignant neoplasm of colon: Secondary | ICD-10-CM

## 2021-09-10 DIAGNOSIS — D123 Benign neoplasm of transverse colon: Secondary | ICD-10-CM

## 2021-09-10 DIAGNOSIS — D125 Benign neoplasm of sigmoid colon: Secondary | ICD-10-CM

## 2021-09-10 DIAGNOSIS — R194 Change in bowel habit: Secondary | ICD-10-CM | POA: Diagnosis not present

## 2021-09-10 DIAGNOSIS — K5900 Constipation, unspecified: Secondary | ICD-10-CM

## 2021-09-10 MED ORDER — SODIUM CHLORIDE 0.9 % IV SOLN
500.0000 mL | Freq: Once | INTRAVENOUS | Status: DC
Start: 2021-09-10 — End: 2021-09-10

## 2021-09-10 NOTE — Patient Instructions (Addendum)
Impression/Recommendations:  Polyp and hemorrhoid handouts given to patient.  Resume previous diet. Continue present medications.  Continue Miralax. Await pathology results.  YOU HAD AN ENDOSCOPIC PROCEDURE TODAY AT [facility]:   Refer to the procedure report that was given to you for any specific questions about what was found during the examination.  If the procedure report does not answer your questions, please call your gastroenterologist to clarify.  If you requested that your care partner not be given the details of your procedure findings, then the procedure report has been included in a sealed envelope for you to review at your convenience later.  YOU SHOULD EXPECT: Some feelings of bloating in the abdomen. Passage of more gas than usual.  Walking can help get rid of the air that was put into your GI tract during the procedure and reduce the bloating. If you had a lower endoscopy (such as a colonoscopy or flexible sigmoidoscopy) you may notice spotting of blood in your stool or on the toilet paper. If you underwent a bowel prep for your procedure, you may not have a normal bowel movement for a few days.  Please Note:  You might notice some irritation and congestion in your nose or some drainage.  This is from the oxygen used during your procedure.  There is no need for concern and it should clear up in a day or so.  SYMPTOMS TO REPORT IMMEDIATELY:  Following lower endoscopy (colonoscopy or flexible sigmoidoscopy):  Excessive amounts of blood in the stool  Significant tenderness or worsening of abdominal pains  Swelling of the abdomen that is new, acute  Fever of 100F or higher  For urgent or emergent issues, a gastroenterologist can be reached at any hour by calling [phone]. Do not use MyChart messaging for urgent concerns.    DIET:  We do recommend a small meal at first, but then you may proceed to your regular diet.  Drink plenty of fluids but you should  avoid alcoholic beverages for 24 hours.  ACTIVITY:  You should plan to take it easy for the rest of today and you should NOT DRIVE or use heavy machinery until tomorrow (because of the sedation medicines used during the test).    FOLLOW UP: Our staff will call the number listed on your records 48-72 hours following your procedure to check on you and address any questions or concerns that you may have regarding the information given to you following your procedure. If we do not reach you, we will leave a message.  We will attempt to reach you two times.  During this call, we will ask if you have developed any symptoms of COVID 19. If you develop any symptoms (ie: fever, flu-like symptoms, shortness of breath, cough etc.) before then, please call [phone].  If you test positive for Covid 19 in the 2 weeks post procedure, please call and report this information to [place].    If any biopsies were taken you will be contacted by phone or by letter within the next 1-3 weeks.  Please call us at [phone] if you have not heard about the biopsies in 3 weeks.    SIGNATURES/CONFIDENTIALITY: You and/or your care partner have signed paperwork which will be entered into your electronic medical record.  These signatures attest to the fact that that the information above on your After Visit Summary has been reviewed and is understood.  Full responsibility of the confidentiality of this discharge information lies with you  and/or your care-partner.

## 2021-09-10 NOTE — Progress Notes (Signed)
Report given to PACU, vss 

## 2021-09-10 NOTE — Progress Notes (Signed)
Called to room to assist during endoscopic procedure.  Patient ID and intended procedure confirmed with present staff. Received instructions for my participation in the procedure from the performing physician.  

## 2021-09-10 NOTE — Progress Notes (Signed)
VS completed by DT.    Medical history reviewed and updated.  

## 2021-09-10 NOTE — Progress Notes (Signed)
History and Physical Interval Note: Patient seen on 09/05/21 - no interval changes. She has had change in bowel habits with rectal swelling for past 2 months. No prior CRC screening. No interval changes since her clinic visit. She wishes to proceed with colonoscopy.    09/10/2021 3:59 PM  Brenda Acosta  has presented today for endoscopic procedure(s), with the diagnosis of  Encounter Diagnoses  Name Primary?   Change in bowel habits Yes   Constipation, unspecified constipation type   .  The various methods of evaluation and treatment have been discussed with the patient and/or family. After consideration of risks, benefits and other options for treatment, the patient has consented to  the endoscopic procedure(s).   The patient's history has been reviewed, patient examined, no change in status, stable for surgery.  I have reviewed the patient's chart and labs.  Questions were answered to the patient's satisfaction.    Jolly Mango, MD [facility]

## 2021-09-10 NOTE — Op Note (Addendum)
[place] Patient Name: Brenda Acosta Procedure Date: 09/10/2021 4:05 PM MRN: [id number] Endoscopist: Remo Lipps P. Havery Moros , MD Age: 45 Referring MD:  Date of Birth: 12-Jan-1976 Gender: Female Account #: [id number] Procedure:                Colonoscopy Indications:              Change in bowel habits - constipation / rectal                            swelling, no prior colonoscopy. Improving with                            Miralax Medicines:                Monitored Anesthesia Care Procedure:                Pre-Anesthesia Assessment:                           - Prior to the procedure, a History and Physical                            was performed, and patient medications and                            allergies were reviewed. The patient's tolerance of                            previous anesthesia was also reviewed. The risks                            and benefits of the procedure and the sedation                            options and risks were discussed with the patient.                            All questions were answered, and informed consent                            was obtained. Prior Anticoagulants: The patient has                            taken no previous anticoagulant or antiplatelet                            agents. ASA Grade Assessment: II - A patient with                            mild systemic disease. After reviewing the risks                            and benefits, the patient was deemed in  satisfactory condition to undergo the procedure.                           After obtaining informed consent, the colonoscope                            was passed under direct vision. Throughout the                            procedure, the patient's blood pressure, pulse, and                            oxygen saturations were monitored continuously. The                            Olympus PCF-H190DL ([id number]) Colonoscope was                             introduced through the anus and advanced to the the                            cecum, identified by appendiceal orifice and                            ileocecal valve. The colonoscopy was performed                            without difficulty. The patient tolerated the                            procedure well. The quality of the bowel                            preparation was adequate. The ileocecal valve,                            appendiceal orifice, and rectum were photographed. Scope In: 4:09:14 PM Scope Out: 4:41:01 PM Scope Withdrawal Time: 0 hours 29 minutes 1 second  Total Procedure Duration: 0 hours 31 minutes 47 seconds  Findings:                 The perianal and digital rectal examinations were                            normal.                           A 18 to 20 mm polyp was found in the hepatic                            flexure. The polyp was pedunculated. Difficult to                            see in entirety, located behind a fold in the  hepatic flexure. Eventually able to get good                            position to remove the polyp with a hot snare.                            Resection and retrieval were complete. To prevent                            bleeding after the polypectomy, one hemostatic clip                            was successfully placed. Area distal to the lesion                            was tattooed with an injection of Spot (carbon                            black).                           A 3 mm polyp was found in the sigmoid colon. The                            polyp was sessile. The polyp was removed with a                            cold snare. Resection and retrieval were complete.                           Internal hemorrhoids were found during retroflexion.                           The exam was otherwise without abnormality. Prep                            was adequate but  several minutes taken to lavage                            the colon to achieve adequate views. Complications:            No immediate complications. Estimated blood loss:                            Minimal. Estimated Blood Loss:     Estimated blood loss was minimal. Impression:               - One 18 to 20 mm polyp at the hepatic flexure,                            removed with a hot snare. Resected and retrieved.                            Clip was placed. Tattooed.                           -  One 3 mm polyp in the sigmoid colon, removed with                            a cold snare. Resected and retrieved.                           - Internal hemorrhoids.                           - The examination was otherwise normal.                           No abnormality otherwise noted to account for                            altered bowel habits. Recommendation:           - Patient has a contact number available for                            emergencies. The signs and symptoms of potential                            delayed complications were discussed with the                            patient. Return to normal activities tomorrow.                            Written discharge instructions were provided to the                            patient.                           - Resume previous diet.                           - Continue present medications (Miralax)                           - Await pathology results. Remo Lipps P. Elianie Hubers, MD 09/10/2021 4:46:46 PM This report has been signed electronically.

## 2021-09-12 ENCOUNTER — Telehealth: Payer: Self-pay

## 2021-09-12 DIAGNOSIS — F321 Major depressive disorder, single episode, moderate: Secondary | ICD-10-CM | POA: Diagnosis not present

## 2021-09-12 NOTE — Telephone Encounter (Signed)
  Follow up Call-  Call back number 09/10/2021  Post procedure Call Back phone  # [phone]  Permission to leave phone message Yes  Some recent data might be hidden     Patient questions:  Do you have a fever, pain , or abdominal swelling? No. Pain Score  0 *  Have you tolerated food without any problems? Yes.    Have you been able to return to your normal activities? Yes.    Do you have any questions about your discharge instructions: Diet   No. Medications  No. Follow up visit  No.  Do you have questions or concerns about your Care? No.  Actions: * If pain score is 4 or above: No action needed, pain <4.  Have you developed a fever since your procedure? no  2.   Have you had an respiratory symptoms (SOB or cough) since your procedure? no  3.   Have you tested positive for COVID 19 since your procedure no  4.   Have you had any family members/close contacts diagnosed with the COVID 19 since your procedure?  no   If yes to any of these questions please route to Joylene John, RN and Joella Prince, RN

## 2021-09-13 ENCOUNTER — Other Ambulatory Visit: Payer: Self-pay

## 2021-09-14 ENCOUNTER — Encounter: Payer: Self-pay | Admitting: Family Medicine

## 2021-09-14 ENCOUNTER — Ambulatory Visit (INDEPENDENT_AMBULATORY_CARE_PROVIDER_SITE_OTHER): Payer: BC Managed Care – PPO | Admitting: Family Medicine

## 2021-09-14 VITALS — BP 122/78 | HR 85 | Temp 98.4°F | Wt 195.8 lb

## 2021-09-14 DIAGNOSIS — D75839 Thrombocytosis, unspecified: Secondary | ICD-10-CM

## 2021-09-14 DIAGNOSIS — R600 Localized edema: Secondary | ICD-10-CM | POA: Diagnosis not present

## 2021-09-14 DIAGNOSIS — F419 Anxiety disorder, unspecified: Secondary | ICD-10-CM | POA: Diagnosis not present

## 2021-09-14 LAB — CBC WITH DIFFERENTIAL/PLATELET
Basophils Absolute: 0 10*3/uL (ref 0.0–0.1)
Basophils Relative: 0.6 % (ref 0.0–3.0)
Eosinophils Absolute: 0.4 10*3/uL (ref 0.0–0.7)
Eosinophils Relative: 5.8 % — ABNORMAL HIGH (ref 0.0–5.0)
HCT: 36.2 % (ref 36.0–46.0)
Hemoglobin: 11.8 g/dL — ABNORMAL LOW (ref 12.0–15.0)
Lymphocytes Relative: 42.2 % (ref 12.0–46.0)
Lymphs Abs: 3 10*3/uL (ref 0.7–4.0)
MCHC: 32.6 g/dL (ref 30.0–36.0)
MCV: 86.1 fl (ref 78.0–100.0)
Monocytes Absolute: 0.4 10*3/uL (ref 0.1–1.0)
Monocytes Relative: 5.5 % (ref 3.0–12.0)
Neutro Abs: 3.2 10*3/uL (ref 1.4–7.7)
Neutrophils Relative %: 45.9 % (ref 43.0–77.0)
Platelets: 446 10*3/uL — ABNORMAL HIGH (ref 150.0–400.0)
RBC: 4.2 Mil/uL (ref 3.87–5.11)
RDW: 15.1 % (ref 11.5–15.5)
WBC: 7 10*3/uL (ref 4.0–10.5)

## 2021-09-14 LAB — VITAMIN B12: Vitamin B-12: 190 pg/mL — ABNORMAL LOW (ref 211–911)

## 2021-09-14 MED ORDER — HYDROXYZINE HCL 25 MG PO TABS: 25.0000 mg | ORAL_TABLET | Freq: Every evening | ORAL | 3 refills | Status: DC | PRN

## 2021-09-14 MED ORDER — FUROSEMIDE 20 MG PO TABS: ORAL_TABLET | ORAL | 3 refills | Status: DC

## 2021-09-14 NOTE — Progress Notes (Signed)
Subjective:    Patient ID: Brenda Acosta, female    DOB: 13-Mar-1976, 45 y.o.   MRN: [id number]  Chief Complaint  Patient presents with   Follow-up    Constipation. Had colonoscopy Monday.    HPI Patient was seen today for f/u.  Pt seen by GI for h/o constipation and for colonoscopy.  Awaiting path results.  Retaining some water s/p the procedure.  Has appt with OB/Gyn for concern about vaginal prolapse and mammogram scheduled.  Notes improvement in energy.  Sleep has been good.  If needed takes hydroxyzine.  Still having LLE edema.  Denies pain in L calf.  Pt denies h/o ankle fx or sprains, did dislocate L patella in college.  Pt also seen for follow-up on thrombocytosis.  No family history of blood disorders that she knows of.  Patient stopped taking Paxil 20 mg after a few days as it caused her to feel shaky. Past Medical History:  Diagnosis Date   Chest pain    a. 09/2015 Ex MV: EF >55%, no ischemia/infarct.   Diabetes mellitus without complication ([place])    a. 11/20/2015 A1c 11.5;  b.  noncompliant w/ diabetes meds, glucose checks, f/u, etc.   Dyspnea on exertion    a. 09/2015 Echo: Ef 60-65%, no rwma, mild AI, mildly dil LA.   Essential hypertension    Hyperlipidemia    Morbid obesity ([facility])    Mucinous cystadenoma of ovary    Hx of    Noncompliance     Allergies  Allergen Reactions   Benzoyl Peroxide Other (See Comments)    Burns     ROS General: Denies fever, chills, night sweats, changes in weight, changes in appetite HEENT: Denies headaches, ear pain, changes in vision, rhinorrhea, sore throat CV: Denies CP, palpitations, SOB, orthopnea + LLE edema Pulm: Denies SOB, cough, wheezing GI: Denies abdominal pain, nausea, vomiting, diarrhea, constipation GU: Denies dysuria, hematuria, frequency, vaginal discharge Msk: Denies muscle cramps, joint pains Neuro: Denies weakness, numbness, tingling  Skin: Denies rashes, bruising Psych: Denies depression, anxiety,  hallucinations    Objective:    Blood pressure 122/78, pulse 85, temperature 98.4 F (36.9 C), temperature source Oral, weight 195 lb 12.8 oz (88.8 kg).   Gen. Pleasant, well-nourished, in no distress, normal affect   HEENT: [place]/AT, face symmetric, conjunctiva clear, no scleral icterus, PERRLA, EOMI, nares patent without drainage Lungs: no accessory muscle use, CTAB, no wheezes or rales Cardiovascular: RRR, no m/r/g, trace edema on L ankle Musculoskeletal: No TTP of b/l LEs.  no deformities, no cyanosis or clubbing, normal tone Neuro:  A&Ox3, CN II-XII intact, normal gait  Wt Readings from Last 3 Encounters:  09/14/21 195 lb 12.8 oz (88.8 kg)  09/10/21 195 lb (88.5 kg)  09/05/21 195 lb (88.5 kg)    Lab Results  Component Value Date   WBC 5.6 11/02/2020   HGB 12.4 11/02/2020   HCT 36.2 11/02/2020   PLT 459 (H) 11/02/2020   GLUCOSE 91 11/02/2020   CHOL 215 (H) 11/02/2020   TRIG 87 11/02/2020   HDL 44 (L) 11/02/2020   LDLCALC 152 (H) 11/02/2020   ALT 8 11/02/2020   AST 13 11/02/2020   NA 141 11/02/2020   K 4.6 11/02/2020   CL 106 11/02/2020   CREATININE 1.15 (H) 11/02/2020   BUN 18 11/02/2020   CO2 20 11/02/2020   TSH 1.14 11/02/2020   HGBA1C 5.8 (H) 11/02/2020   MICROALBUR <0.7 03/16/2020    Assessment/Plan:  Thrombocytosis -Platelets 459  on 11/02/2020 and 547 on 03/16/2020 -Discussed possible causes including reactive versus nonreactive -Given handout -For continued elevation obtain further testing/rheumatology referral - Plan: CBC with Differential/Platelet, Vitamin B12  Anxiety  -Improving - Paxil 20 mg d/c as caused patient to shake. -Continue to monitor - Plan: hydrOXYzine (ATARAX/VISTARIL) 25 MG tablet  Bilateral lower extremity edema -Continue supportive care including TED hose, compression socks, elevation, Lasix as needed - Plan: furosemide (LASIX) 20 MG tablet    Pt declines influenza vaccine this visit.  F/u prn  Grier Mitts, MD

## 2021-09-19 DIAGNOSIS — F411 Generalized anxiety disorder: Secondary | ICD-10-CM | POA: Diagnosis not present

## 2021-10-03 ENCOUNTER — Encounter: Payer: Self-pay | Admitting: Family Medicine

## 2021-10-03 DIAGNOSIS — Z124 Encounter for screening for malignant neoplasm of cervix: Secondary | ICD-10-CM | POA: Diagnosis not present

## 2021-10-03 DIAGNOSIS — Z1231 Encounter for screening mammogram for malignant neoplasm of breast: Secondary | ICD-10-CM | POA: Diagnosis not present

## 2021-10-03 DIAGNOSIS — Z01419 Encounter for gynecological examination (general) (routine) without abnormal findings: Secondary | ICD-10-CM | POA: Diagnosis not present

## 2021-10-03 DIAGNOSIS — D649 Anemia, unspecified: Secondary | ICD-10-CM

## 2021-10-03 DIAGNOSIS — Z113 Encounter for screening for infections with a predominantly sexual mode of transmission: Secondary | ICD-10-CM | POA: Diagnosis not present

## 2021-10-03 DIAGNOSIS — R7989 Other specified abnormal findings of blood chemistry: Secondary | ICD-10-CM

## 2021-10-03 DIAGNOSIS — Z139 Encounter for screening, unspecified: Secondary | ICD-10-CM | POA: Diagnosis not present

## 2021-10-03 DIAGNOSIS — N920 Excessive and frequent menstruation with regular cycle: Secondary | ICD-10-CM | POA: Diagnosis not present

## 2021-10-04 DIAGNOSIS — F411 Generalized anxiety disorder: Secondary | ICD-10-CM | POA: Diagnosis not present

## 2021-10-05 NOTE — Telephone Encounter (Signed)
Spoke with pt, scheduled first B12 injection, and informed referral is being placed and someone will reach out to schedule.

## 2021-10-09 ENCOUNTER — Ambulatory Visit: Payer: BC Managed Care – PPO

## 2021-10-10 DIAGNOSIS — Z30430 Encounter for insertion of intrauterine contraceptive device: Secondary | ICD-10-CM | POA: Diagnosis not present

## 2021-10-10 DIAGNOSIS — F411 Generalized anxiety disorder: Secondary | ICD-10-CM | POA: Diagnosis not present

## 2021-10-16 ENCOUNTER — Ambulatory Visit (INDEPENDENT_AMBULATORY_CARE_PROVIDER_SITE_OTHER): Payer: BC Managed Care – PPO

## 2021-10-16 DIAGNOSIS — E538 Deficiency of other specified B group vitamins: Secondary | ICD-10-CM

## 2021-10-16 MED ORDER — CYANOCOBALAMIN 1000 MCG/ML IJ SOLN
1000.0000 ug | Freq: Once | INTRAMUSCULAR | Status: AC
  Administered 2021-10-16: 1000 ug via INTRAMUSCULAR

## 2021-10-16 NOTE — Progress Notes (Signed)
Per orders from Dr Volanda Napoleon, pt was given B12 injection by Madaline Guthrie, pt tolerated well.

## 2021-10-17 DIAGNOSIS — F411 Generalized anxiety disorder: Secondary | ICD-10-CM | POA: Diagnosis not present

## 2021-10-18 ENCOUNTER — Other Ambulatory Visit: Payer: Self-pay | Admitting: Family Medicine

## 2021-10-18 DIAGNOSIS — I10 Essential (primary) hypertension: Secondary | ICD-10-CM

## 2021-10-19 ENCOUNTER — Encounter: Payer: Self-pay | Admitting: Oncology

## 2021-10-19 ENCOUNTER — Inpatient Hospital Stay: Payer: BC Managed Care – PPO

## 2021-10-19 ENCOUNTER — Inpatient Hospital Stay: Payer: BC Managed Care – PPO | Attending: Oncology | Admitting: Oncology

## 2021-10-19 ENCOUNTER — Other Ambulatory Visit: Payer: Self-pay

## 2021-10-19 VITALS — BP 120/84 | HR 80 | Temp 97.6°F | Wt 189.0 lb

## 2021-10-19 DIAGNOSIS — Z903 Acquired absence of stomach [part of]: Secondary | ICD-10-CM | POA: Diagnosis not present

## 2021-10-19 DIAGNOSIS — D75839 Thrombocytosis, unspecified: Secondary | ICD-10-CM | POA: Insufficient documentation

## 2021-10-19 DIAGNOSIS — Z8042 Family history of malignant neoplasm of prostate: Secondary | ICD-10-CM

## 2021-10-19 LAB — COMPREHENSIVE METABOLIC PANEL
ALT: 28 U/L (ref 0–44)
AST: 27 U/L (ref 15–41)
Albumin: 4 g/dL (ref 3.5–5.0)
Alkaline Phosphatase: 36 U/L — ABNORMAL LOW (ref 38–126)
Anion gap: 8 (ref 5–15)
BUN: 28 mg/dL — ABNORMAL HIGH (ref 6–20)
CO2: 27 mmol/L (ref 22–32)
Calcium: 8.9 mg/dL (ref 8.9–10.3)
Chloride: 102 mmol/L (ref 98–111)
Creatinine, Ser: 1.03 mg/dL — ABNORMAL HIGH (ref 0.44–1.00)
GFR, Estimated: >60 mL/min (ref >60)
Glucose, Bld: 93 mg/dL (ref 70–99)
Potassium: 3.8 mmol/L (ref 3.5–5.1)
Sodium: 137 mmol/L (ref 135–145)
Total Bilirubin: 0.2 mg/dL — ABNORMAL LOW (ref 0.3–1.2)
Total Protein: 7.8 g/dL (ref 6.5–8.1)

## 2021-10-19 LAB — CBC WITH DIFFERENTIAL/PLATELET
Abs Immature Granulocytes: 0.04 10*3/uL (ref 0.00–0.07)
Basophils Absolute: 0.1 10*3/uL (ref 0.0–0.1)
Basophils Relative: 1 %
Eosinophils Absolute: 0.4 10*3/uL (ref 0.0–0.5)
Eosinophils Relative: 5 %
HCT: 40 % (ref 36.0–46.0)
Hemoglobin: 12.9 g/dL (ref 12.0–15.0)
Immature Granulocytes: 1 %
Lymphocytes Relative: 42 %
Lymphs Abs: 3.2 10*3/uL (ref 0.7–4.0)
MCH: 28.3 pg (ref 26.0–34.0)
MCHC: 32.3 g/dL (ref 30.0–36.0)
MCV: 87.7 fL (ref 80.0–100.0)
Monocytes Absolute: 0.4 10*3/uL (ref 0.1–1.0)
Monocytes Relative: 5 %
Neutro Abs: 3.7 10*3/uL (ref 1.7–7.7)
Neutrophils Relative %: 46 %
Platelets: 496 10*3/uL — ABNORMAL HIGH (ref 150–400)
RBC: 4.56 MIL/uL (ref 3.87–5.11)
RDW: 13.5 % (ref 11.5–15.5)
WBC: 7.7 10*3/uL (ref 4.0–10.5)
nRBC: 0 % (ref 0.0–0.2)

## 2021-10-19 LAB — RETIC PANEL
Immature Retic Fract: 5.1 % (ref 2.3–15.9)
RBC.: 4.51 MIL/uL (ref 3.87–5.11)
Retic Count, Absolute: 52.3 10*3/uL (ref 19.0–186.0)
Retic Ct Pct: 1.2 % (ref 0.4–3.1)
Reticulocyte Hemoglobin: 33.2 pg (ref >27.9)

## 2021-10-19 LAB — IRON AND TIBC
Iron: 94 ug/dL (ref 28–170)
Saturation Ratios: 19 % (ref 10.4–31.8)
TIBC: 498 ug/dL — ABNORMAL HIGH (ref 250–450)
UIBC: 404 ug/dL

## 2021-10-19 LAB — FERRITIN: Ferritin: 17 ng/mL (ref 11–307)

## 2021-10-19 NOTE — Progress Notes (Signed)
Hematology/Oncology Consult note [facility] Telephone:([phone] [phone]   Patient Care Team: [name], MD as PCP - General (Family Medicine)  REFERRING PROVIDER: [name], MD  CHIEF COMPLAINTS/REASON FOR VISIT:  Evaluation of thrombocytosis  HISTORY OF PRESENTING ILLNESS:  Brenda Acosta is a 45 y.o. female who was seen in consultation at the request of [name], MD for evaluation of thromcbocytosis Reviewed patient's labs.  09/14/2021 labs showed elevated platelet counts at 446000 Normal wbc  and hemoglobin   Reviewed patient's previous labs. Thrombocytosis onset is chronic onset , duration since 2014. No aggravating or elevated factors. Associated symptoms or signs:  Denies unintentional weight loss, fever, chills, fatigue, night sweats.  Patient has lost some weight after coming off OCP.  She has an IUD Context:  Smoking history: Non-smoker Denied.  History of thrombosis. Patient has a history of gastric sleeve resection.   Review of Systems  Constitutional:  Negative for appetite change, chills, fatigue and fever.  HENT:   Negative for hearing loss and voice change.   Eyes:  Negative for eye problems.  Respiratory:  Negative for chest tightness and cough.   Cardiovascular:  Negative for chest pain.  Gastrointestinal:  Negative for abdominal distention, abdominal pain and blood in stool.  Endocrine: Negative for hot flashes.  Genitourinary:  Negative for difficulty urinating and frequency.   Musculoskeletal:  Negative for arthralgias.  Skin:  Negative for itching and rash.  Neurological:  Negative for extremity weakness.  Hematological:  Negative for adenopathy.  Psychiatric/Behavioral:  Negative for confusion.    MEDICAL HISTORY:  Past Medical History:  Diagnosis Date   Chest pain    a. 09/2015 Ex MV: EF >55%, no ischemia/infarct.   Diabetes mellitus without complication ([place])    a. 11/20/2015  A1c 11.5;  b.  noncompliant w/ diabetes meds, glucose checks, f/u, etc.   Dyspnea on exertion    a. 09/2015 Echo: Ef 60-65%, no rwma, mild AI, mildly dil LA.   Essential hypertension    Hyperlipidemia    Morbid obesity ([place])    Mucinous cystadenoma of ovary    Hx of    Noncompliance     SURGICAL HISTORY: Past Surgical History:  Procedure Laterality Date   ABDOMINAL HYSTERECTOMY     partial hyst   coloscopy     LAPAROSCOPIC GASTRIC SLEEVE RESECTION  01/08/2018   MOUTH SURGERY     OOPHORECTOMY  07/02/2006   Rt; large benign ovarian tumor    SOCIAL HISTORY: Social History   Socioeconomic History   Marital status: Single    Spouse name: Not on file   Number of children: 0   Years of education: Not on file   Highest education level: Not on file  Occupational History    Employer: [employer]  Tobacco Use   Smoking status: Never   Smokeless tobacco: Never  Vaping Use   Vaping Use: Never used  Substance and Sexual Activity   Alcohol use: No   Drug use: No   Sexual activity: Yes    Partners: Male  Other Topics Concern   Not on file  Social History Narrative   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 Avg. Glucose: 298      Regular exercise: no   Caffeine use:  occassionally                  Social Determinants of Health   Financial Resource Strain: Not on file  Food Insecurity: Not on file  Transportation Needs: Not on file  Physical Activity: Not on file  Stress: Not on file  Social Connections: Not on file  Intimate Partner Violence: Not on file    FAMILY HISTORY: Family History  Problem Relation Age of Onset   Heart disease Mother 34       heart attack   Hypertension Father    Diabetes Father        Type II   Cancer Maternal Grandfather        prostate cancer   Colon cancer Neg Hx    Rectal  cancer Neg Hx    Stomach cancer Neg Hx     ALLERGIES:  is allergic to benzoyl peroxide.  MEDICATIONS:  Current Outpatient Medications  Medication Sig Dispense Refill   BAYER MICROLET LANCETS lancets Use to test blood sugar 2 time daily as instructed. 100 each 5   Blood Glucose Monitoring Suppl (BAYER CONTOUR NEXT MONITOR) w/Device KIT Use to test blood sugar daily. 1 kit 0   CONTOUR NEXT TEST test strip USE 1 STRIP TO CHECK GLUCOSE TWICE DAILY AS DIRECTED 100 each 0   furosemide (LASIX) 20 MG tablet Take one tab daily prn for lower extremity edema 30 tablet 3   hydrOXYzine (ATARAX/VISTARIL) 25 MG tablet Take 1 tablet (25 mg total) by mouth at bedtime as needed for anxiety. 30 tablet 3   Insulin Pen Needle (PEN NEEDLES) 32G X 5 MM MISC For daily use. 100 each 2   Liraglutide -Weight Management (SAXENDA) 18 MG/3ML SOPN Inject 3 mg into the skin daily. Use as directed.  Inject 0.6 mg daily x1 week.  Increase dose by 0.6 mg weekly.  Max dose 3 mg/day. 72 mL 1   lisinopril-hydrochlorothiazide (ZESTORETIC) 20-25 MG tablet TAKE 1 TABLET BY MOUTH ONCE DAILY 90 tablet 0   senna-docusate (SENOKOT-S) 8.6-50 MG tablet Take 1 tablet by mouth daily. 30 tablet 1   No current facility-administered medications for this visit.     PHYSICAL EXAMINATION: ECOG PERFORMANCE STATUS: 0 - Asymptomatic Vitals:   10/19/21 0933  BP: 120/84  Pulse: 80  Temp: 97.6 F (36.4 C)   Filed Weights   10/19/21 0933  Weight: 189 lb (85.7 kg)    Physical Exam   LABORATORY DATA:  I have reviewed the data as listed Lab Results  Component Value Date   WBC 7.7 10/19/2021   HGB 12.9 10/19/2021   HCT 40.0 10/19/2021   MCV 87.7 10/19/2021   PLT 496 (H) 10/19/2021   Recent Labs    11/02/20 0926 10/19/21 1011  NA 141 137  K 4.6 3.8  CL 106 102  CO2 20 27  GLUCOSE 91 93  BUN 18 28*  CREATININE 1.15* 1.03*  CALCIUM 9.4 8.9  GFRNONAA 58* >60  GFRAA 67  --   PROT 6.8 7.8  ALBUMIN  --  4.0  AST 13 27   ALT 8 28  ALKPHOS  --  36*  BILITOT 0.3 0.2*   Iron/TIBC/Ferritin/ %Sat    Component Value Date/Time   IRON 94 10/19/2021 1011   TIBC 498 (H) 10/19/2021 1011   FERRITIN 17 10/19/2021 1011   IRONPCTSAT 19 10/19/2021 1011      RADIOGRAPHIC STUDIES: I have personally reviewed the radiological images as listed and agreed with  the findings in the report. No results found.    ASSESSMENT & PLAN:  1. Thrombocytosis   2. H/O gastric sleeve    I discussed with patient that the differential diagnosis of the thrombosis is broad, including benign etiology such as reactive to surgery, trauma, infection, nutrition deficiency, etc, as well as malignant etiology including underlying bone marrow disorders.   For the work up of patient's thrombocytosis, I recommend checking CBC;CMP, LDH, smear review, iron,TIBC, ferritin and JAK 2 mutation,  BCR ABL; MPL; CALR mutation.  BCR ABL 1 FISH Patient will follow-up 3-4 weeks to discuss results  Orders Placed This Encounter  Procedures   Iron and TIBC    Standing Status:   Future    Number of Occurrences:   1    Standing Expiration Date:   10/19/2022   Ferritin    Standing Status:   Future    Number of Occurrences:   1    Standing Expiration Date:   04/18/2022   CBC with Differential/Platelet    Standing Status:   Future    Number of Occurrences:   1    Standing Expiration Date:   10/19/2022   Retic Panel    Standing Status:   Future    Number of Occurrences:   1    Standing Expiration Date:   10/19/2022   JAK2 V617F, w Reflex to CALR/E12/MPL    Standing Status:   Future    Number of Occurrences:   1    Standing Expiration Date:   10/19/2022   BCR-ABL1 FISH    Standing Status:   Future    Number of Occurrences:   1    Standing Expiration Date:   10/19/2022   Comprehensive metabolic panel    Standing Status:   Future    Number of Occurrences:   1    Standing Expiration Date:   10/19/2022    All questions were answered. The patient  knows to call the clinic with any problems questions or concerns.  Cc [name], MD  Thank you for this kind referral and the opportunity to participate in the care of this patient. A copy of today's note is routed to referring provider     Earlie Server, MD, PhD 10/19/2021

## 2021-10-23 DIAGNOSIS — F411 Generalized anxiety disorder: Secondary | ICD-10-CM | POA: Diagnosis not present

## 2021-10-23 LAB — BCR-ABL1 FISH
Cells Analyzed: 200
Cells Counted: 200

## 2021-10-29 ENCOUNTER — Other Ambulatory Visit: Payer: Self-pay | Admitting: Oncology

## 2021-10-30 LAB — CALR + JAK2 E12-15 + MPL (REFLEXED)

## 2021-10-30 LAB — JAK2 V617F, W REFLEX TO CALR/E12/MPL

## 2021-11-02 DIAGNOSIS — F411 Generalized anxiety disorder: Secondary | ICD-10-CM | POA: Diagnosis not present

## 2021-11-03 DIAGNOSIS — Z03818 Encounter for observation for suspected exposure to other biological agents ruled out: Secondary | ICD-10-CM | POA: Diagnosis not present

## 2021-11-03 DIAGNOSIS — Z20822 Contact with and (suspected) exposure to covid-19: Secondary | ICD-10-CM | POA: Diagnosis not present

## 2021-11-07 ENCOUNTER — Telehealth: Payer: BC Managed Care – PPO | Admitting: Oncology

## 2021-11-09 DIAGNOSIS — F411 Generalized anxiety disorder: Secondary | ICD-10-CM | POA: Diagnosis not present

## 2021-11-12 ENCOUNTER — Encounter: Payer: Self-pay | Admitting: Family Medicine

## 2021-11-13 ENCOUNTER — Inpatient Hospital Stay: Payer: BC Managed Care – PPO | Attending: Oncology | Admitting: Oncology

## 2021-11-13 ENCOUNTER — Encounter: Payer: Self-pay | Admitting: Oncology

## 2021-11-13 DIAGNOSIS — Z903 Acquired absence of stomach [part of]: Secondary | ICD-10-CM | POA: Diagnosis not present

## 2021-11-13 DIAGNOSIS — D75839 Thrombocytosis, unspecified: Secondary | ICD-10-CM | POA: Diagnosis not present

## 2021-11-13 MED ORDER — IRON-VITAMIN C 65-125 MG PO TABS: 1.0000 | ORAL_TABLET | Freq: Every day | ORAL | 2 refills | Status: DC

## 2021-11-13 NOTE — Progress Notes (Signed)
HEMATOLOGY-ONCOLOGY TeleHEALTH VISIT PROGRESS NOTE  I connected with Brenda Acosta on 11/13/21  at  2:15 PM EST by video enabled telemedicine visit and verified that I am speaking with the correct person using two identifiers. I discussed the limitations, risks, security and privacy concerns of performing an evaluation and management service by telemedicine and the availability of in-person appointments. The patient expressed understanding and agreed to proceed.   Other persons participating in the visit and their role in the encounter:  None  Patient's location: Home  Provider's location: office Chief Complaint: Thrombocytosis   INTERVAL HISTORY Brenda Acosta is a 45 y.o. female who has above history reviewed by me today presents for follow up visit for thrombocytosis Problems and complaints are listed below:  Patient had a blood work done after last visit.  She presents virtually to discuss results.  No new complaints.  Review of systems negative Past Medical History:  Diagnosis Date   Chest pain    a. 09/2015 Ex MV: EF >55%, no ischemia/infarct.   Diabetes mellitus without complication ([place])    a. 11/20/2015 A1c 11.5;  b.  noncompliant w/ diabetes meds, glucose checks, f/u, etc.   Dyspnea on exertion    a. 09/2015 Echo: Ef 60-65%, no rwma, mild AI, mildly dil LA.   Essential hypertension    Hyperlipidemia    Morbid obesity ([place])    Mucinous cystadenoma of ovary    Hx of    Noncompliance    Past Surgical History:  Procedure Laterality Date   ABDOMINAL HYSTERECTOMY     partial hyst   coloscopy     LAPAROSCOPIC GASTRIC SLEEVE RESECTION  01/08/2018   MOUTH SURGERY     OOPHORECTOMY  07/02/2006   Rt; large benign ovarian tumor    Family History  Problem Relation Age of Onset   Heart disease Mother 50       heart attack   Hypertension Father    Diabetes Father        Type II   Cancer Maternal Grandfather        prostate cancer   Colon cancer Neg Hx    Rectal  cancer Neg Hx    Stomach cancer Neg Hx     Social History   Socioeconomic History   Marital status: Single    Spouse name: Not on file   Number of children: 0   Years of education: Not on file   Highest education level: Not on file  Occupational History    Employer: [employer]  Tobacco Use   Smoking status: Never   Smokeless tobacco: Never  Vaping Use   Vaping Use: Never used  Substance and Sexual Activity   Alcohol use: No   Drug use: No   Sexual activity: Yes    Partners: Male  Other Topics Concern   Not on file  Social History Narrative   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 Avg. Glucose: 298      Regular exercise: no   Caffeine use: occassionally                  Social Determinants of Health   Financial Resource Strain: Not on file  Food Insecurity: Not on file  Transportation Needs: Not on file  Physical Activity: Not on file  Stress: Not on file  Social Connections: Not on file  Intimate Partner Violence: Not on file    Current Outpatient Medications on File Prior to Visit  Medication Sig Dispense Refill   BAYER MICROLET LANCETS lancets Use to test blood sugar 2 time daily as instructed. 100 each 5   Blood Glucose Monitoring Suppl (BAYER CONTOUR NEXT MONITOR) w/Device KIT Use to test blood sugar daily. 1 kit 0   CONTOUR NEXT TEST test strip USE 1 STRIP TO CHECK GLUCOSE TWICE DAILY AS DIRECTED 100 each 0   furosemide (LASIX) 20 MG tablet Take one tab daily prn for lower extremity edema 30 tablet 3   hydrOXYzine (ATARAX/VISTARIL) 25 MG tablet Take 1 tablet (25 mg total) by mouth at bedtime as needed for anxiety. 30 tablet 3   Insulin Pen Needle (PEN NEEDLES) 32G X 5 MM MISC For daily use. 100 each 2   Liraglutide -Weight Management (SAXENDA) 18 MG/3ML SOPN Inject 3 mg into the skin daily. Use as  directed.  Inject 0.6 mg daily x1 week.  Increase dose by 0.6 mg weekly.  Max dose 3 mg/day. 72 mL 1   lisinopril-hydrochlorothiazide (ZESTORETIC) 20-25 MG tablet TAKE 1 TABLET BY MOUTH ONCE DAILY 90 tablet 0   senna-docusate (SENOKOT-S) 8.6-50 MG tablet Take 1 tablet by mouth daily. 30 tablet 1   No current facility-administered medications on file prior to visit.    Allergies  Allergen Reactions   Benzoyl Peroxide Other (See Comments)    Burns        Observations/Objective: Today's Vitals   11/13/21 1123  PainSc: 0-No pain   There is no height or weight on file to calculate BMI.  Physical Exam Neurological:     Mental Status: She is alert.    CBC    Component Value Date/Time   WBC 7.7 10/19/2021 1011   RBC 4.51 10/19/2021 1011   RBC 4.56 10/19/2021 1011   HGB 12.9 10/19/2021 1011   HGB 10.7 (L) 03/22/2013 0838   HCT 40.0 10/19/2021 1011   HCT 32.8 (L) 03/22/2013 0838   PLT 496 (H) 10/19/2021 1011   PLT 470 (H) 03/22/2013 0838   MCV 87.7 10/19/2021 1011   MCV 80 03/22/2013 0838   MCH 28.3 10/19/2021 1011   MCHC 32.3 10/19/2021 1011   RDW 13.5 10/19/2021 1011   RDW 14.9 (H) 03/22/2013 0838   LYMPHSABS 3.2 10/19/2021 1011   MONOABS 0.4 10/19/2021 1011   EOSABS 0.4 10/19/2021 1011   BASOSABS 0.1 10/19/2021 1011    CMP     Component Value Date/Time   NA 137 10/19/2021 1011   NA 137 03/22/2013 0838   K 3.8 10/19/2021 1011   K 4.0 03/22/2013 0838   CL 102 10/19/2021 1011   CL 108 (H) 03/22/2013 0838   CO2 27 10/19/2021 1011   CO2 22 03/22/2013 0838   GLUCOSE 93 10/19/2021 1011   GLUCOSE 246 (H) 03/22/2013 0838   BUN 28 (H) 10/19/2021 1011   BUN 13 03/22/2013 0838   CREATININE 1.03 (H) 10/19/2021 1011   CREATININE 1.15 (H) 11/02/2020 0926   CALCIUM 8.9 10/19/2021 1011   CALCIUM 8.5 03/22/2013 0838   PROT 7.8 10/19/2021 1011   ALBUMIN 4.0 10/19/2021 1011   AST 27 10/19/2021 1011   ALT 28 10/19/2021 1011   ALKPHOS 36 (L) 10/19/2021 1011   BILITOT 0.2 (L)  10/19/2021 1011   GFRNONAA >60 10/19/2021 1011   GFRNONAA 58 (L) 11/02/2020 0926   GFRAA 67 11/02/2020  [id number]     Assessment and Plan: 1. Thrombocytosis   2. H/O gastric sleeve     Labs reviewed and discussed with patient. JAK2 V617F mutation negative, with reflex to other mutations CALR, MPL, JAK 2 Ex 12-15 mutations negative. BCR ABL negative.  Less likely primary myeloproliferative disorders Iron panel showed ferritin of 17, increased TIBC.  This could indicate early sign of iron deficiency.  We discussed that iron deficiency may contribute to thrombocytosis.  Her chronic thrombocytosis could be also due to other reactive etiologies.  I recommend patient to consider 3 months trial of oral iron supplementation.  She agrees.  Prescription of Vitron C was sent to pharmacy.  Side effects were discussed.  History of gastric sleeve resection, B12 deficiency.  Patient is on vitamin B12 supplementation via primary care provider's office. Follow Up Instructions: 6 months   I discussed the assessment and treatment plan with the patient. The patient was provided an opportunity to ask questions and all were answered. The patient agreed with the plan and demonstrated an understanding of the instructions.  The patient was advised to call back or seek an in-person evaluation if the symptoms worsen or if the condition fails to improve as anticipated.   Earlie Server, MD 11/13/2021 10:45 PM

## 2021-11-13 NOTE — Progress Notes (Signed)
Patient contacted for Mychart visit. No new concerns voiced.  

## 2021-11-13 NOTE — Telephone Encounter (Signed)
Saxenda last refilled 11/02/20. Pt is due CPE. Pt called & notified of this. NV appt changed to a CPE, pt advised to come fasting for labs. Pt also advised that B12 injection can be given at that visit if ordered & Saxenda discussed. Pt verb understanding.

## 2021-11-14 DIAGNOSIS — F411 Generalized anxiety disorder: Secondary | ICD-10-CM | POA: Diagnosis not present

## 2021-11-15 ENCOUNTER — Encounter: Payer: Self-pay | Admitting: Family Medicine

## 2021-11-15 ENCOUNTER — Ambulatory Visit (INDEPENDENT_AMBULATORY_CARE_PROVIDER_SITE_OTHER): Payer: BC Managed Care – PPO | Admitting: Family Medicine

## 2021-11-15 ENCOUNTER — Ambulatory Visit: Payer: BC Managed Care – PPO

## 2021-11-15 VITALS — BP 122/78 | HR 70 | Temp 98.5°F | Wt 194.8 lb

## 2021-11-15 DIAGNOSIS — I10 Essential (primary) hypertension: Secondary | ICD-10-CM

## 2021-11-15 DIAGNOSIS — Z6831 Body mass index (BMI) 31.0-31.9, adult: Secondary | ICD-10-CM

## 2021-11-15 DIAGNOSIS — Z1159 Encounter for screening for other viral diseases: Secondary | ICD-10-CM

## 2021-11-15 DIAGNOSIS — E669 Obesity, unspecified: Secondary | ICD-10-CM

## 2021-11-15 DIAGNOSIS — E782 Mixed hyperlipidemia: Secondary | ICD-10-CM | POA: Diagnosis not present

## 2021-11-15 DIAGNOSIS — E1139 Type 2 diabetes mellitus with other diabetic ophthalmic complication: Secondary | ICD-10-CM

## 2021-11-15 DIAGNOSIS — Z0000 Encounter for general adult medical examination without abnormal findings: Secondary | ICD-10-CM

## 2021-11-15 DIAGNOSIS — Z903 Acquired absence of stomach [part of]: Secondary | ICD-10-CM

## 2021-11-15 DIAGNOSIS — E118 Type 2 diabetes mellitus with unspecified complications: Secondary | ICD-10-CM

## 2021-11-15 DIAGNOSIS — Z30431 Encounter for routine checking of intrauterine contraceptive device: Secondary | ICD-10-CM | POA: Diagnosis not present

## 2021-11-15 DIAGNOSIS — Z975 Presence of (intrauterine) contraceptive device: Secondary | ICD-10-CM

## 2021-11-15 DIAGNOSIS — E538 Deficiency of other specified B group vitamins: Secondary | ICD-10-CM | POA: Diagnosis not present

## 2021-11-15 DIAGNOSIS — E66811 Obesity, class 1: Secondary | ICD-10-CM

## 2021-11-15 LAB — BASIC METABOLIC PANEL
BUN: 20 mg/dL (ref 6–23)
CO2: 27 mEq/L (ref 19–32)
Calcium: 9.7 mg/dL (ref 8.4–10.5)
Chloride: 105 mEq/L (ref 96–112)
Creatinine, Ser: 1.02 mg/dL (ref 0.40–1.20)
GFR: 66.43 mL/min (ref >60.00)
Glucose, Bld: 89 mg/dL (ref 70–99)
Potassium: 3.9 mEq/L (ref 3.5–5.1)
Sodium: 139 mEq/L (ref 135–145)

## 2021-11-15 LAB — HEMOGLOBIN A1C: Hgb A1c MFr Bld: 7 % — ABNORMAL HIGH (ref 4.6–6.5)

## 2021-11-15 LAB — TSH: TSH: 0.82 u[IU]/mL (ref 0.35–5.50)

## 2021-11-15 LAB — LIPID PANEL
Cholesterol: 213 mg/dL — ABNORMAL HIGH (ref 0–200)
HDL: 53 mg/dL (ref >39.00)
LDL Cholesterol: 147 mg/dL — ABNORMAL HIGH (ref 0–99)
NonHDL: 160.36
Total CHOL/HDL Ratio: 4
Triglycerides: 66 mg/dL (ref 0.0–149.0)
VLDL: 13.2 mg/dL (ref 0.0–40.0)

## 2021-11-15 LAB — T4, FREE: Free T4: 0.78 ng/dL (ref 0.60–1.60)

## 2021-11-15 MED ORDER — PEN NEEDLES 32G X 5 MM MISC: 2 refills | Status: DC

## 2021-11-15 MED ORDER — SAXENDA 18 MG/3ML ~~LOC~~ SOPN: 3.0000 mg | PEN_INJECTOR | Freq: Every day | SUBCUTANEOUS | 1 refills | Status: DC

## 2021-11-15 MED ORDER — CYANOCOBALAMIN 1000 MCG/ML IJ SOLN
1000.0000 ug | Freq: Once | INTRAMUSCULAR | Status: AC
  Administered 2021-11-15: 1000 ug via INTRAMUSCULAR

## 2021-11-15 NOTE — Progress Notes (Signed)
Subjective:     Brenda Acosta is a 45 y.o. female and is here for a comprehensive physical exam.  Patient taking Saxenda for weight loss and diabetes.  Needs refill.  Had follow-up with hematology for thrombocytosis on 12/13, advised to start po iron and Vit C x 3 months.  Patient taking bariatric vitamins daily status post history of sleeve gastrectomy in 2019. Patient is mostly vegan, but started eating cheese at night as a snack with grapes.  Patient doing intermittent fasting for 8 hours.  At times only eats 1 meal per day.  Endorses recent IUD placement, has OB/GYN follow-up today.  Notes weight loss since stopping OCPs.  Had mammogram this year and colonoscopy.  Several polyps removed during colonoscopy.  Patient still having intermittent constipation.  Trying to increase water intake.    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 Avg. Glucose: 298      Regular exercise: no   Caffeine use: occassionally                  Social Determinants of Health   Financial Resource Strain: Not on file  Food Insecurity: Not on file  Transportation Needs: Not on file  Physical Activity: Not on file  Stress: Not on file  Social Connections: Not on file  Intimate Partner Violence: Not on file   Health Maintenance  Topic Date Due    Pneumococcal Vaccine 71-22 Years old (1 - PCV) Never done   FOOT EXAM  Never done   HIV Screening  Never done   Hepatitis C Screening  Never done   COVID-19 Vaccine (4 - Booster for Moderna series) 01/27/2021   HEMOGLOBIN A1C  05/03/2021   OPHTHALMOLOGY EXAM  11/30/2021 (Originally 07/07/2014)   INFLUENZA VACCINE  03/01/2022 (Originally 07/02/2021)   TETANUS/TDAP  05/13/2022   PAP SMEAR-Modifier  08/31/2024   COLONOSCOPY (Pts 45-46yrs Insurance coverage will need to be confirmed)  09/10/2024   HPV VACCINES  Aged Out    The following portions of the patient's history were reviewed and updated as appropriate: allergies, current medications, past family history, past medical history, past social history, past surgical history, and problem list.  Review of Systems Pertinent items noted in HPI and remainder of comprehensive ROS otherwise negative.   Objective:    BP 122/78 (BP Location: Left Arm, Patient Position: Sitting, Cuff Size: Normal)    Pulse 70    Temp 98.5 F (36.9 C) (Oral)    Wt 194 lb 12.8 oz (88.4 kg)    SpO2 93%    BMI 31.44 kg/m  General appearance: alert, cooperative, and no distress Head: Normocephalic, without obvious abnormality,  atraumatic Eyes: conjunctivae/corneas clear. PERRL, EOM's intact. Fundi benign. Ears: normal TM's and external ear canals both ears Nose: Nares normal. Septum midline. Mucosa normal. No drainage or sinus tenderness. Throat: lips, mucosa, and tongue normal; teeth and gums normal Neck: no adenopathy, no carotid bruit, no JVD, supple, symmetrical, trachea midline, and thyroid not enlarged, symmetric, no tenderness/mass/nodules Lungs: clear to auscultation bilaterally Heart: regular rate and rhythm, S1, S2 normal, no murmur, click, rub or gallop Abdomen: soft, non-tender; bowel sounds normal; no masses,  no organomegaly Extremities: extremities normal, atraumatic, no cyanosis or edema Pulses: 2+ and symmetric Skin: Skin color, texture, turgor  normal. No rashes or lesions Lymph nodes: Cervical, supraclavicular, and axillary nodes normal. Neurologic: Alert and oriented X 3, normal strength and tone. Normal symmetric reflexes. Normal coordination and gait    Assessment:    Healthy female exam.     Plan:    Anticipatory guidance given including wearing seatbelts, smoke detectors in the home, increasing physical activity, increasing p.o. intake of water and vegetables. -labs.  Recent labs from hematology/oncology reviewed -Colonoscopy up-to-date.  Done 09/10/2021. -Mammogram done. -Pap done with OB/GYN.  Has IUD follow-up today. -Reviewed immunizations -Given handout -Next CPE in 1 year See After Visit Summary for Counseling Recommendations   Vitamin B12 deficiency - Plan: cyanocobalamin ((VITAMIN B-12)) injection 1,000 mcg  Mixed hyperlipidemia  -Lifestyle modifications -Consider low-fat cheese - Plan: Lipid panel  Controlled type 2 diabetes mellitus with complication, without long-term current use of insulin (HCC) -Continue Saxenda 3 mg for weight loss -Continue lifestyle modifications -Continue ACE I -Patient to schedule diabetic retinopathy screening -Foot exam at next OF[facility] -For elevated lipids consider starting statin - Plan: Basic metabolic panel, Lipid panel, Hemoglobin A1c - Plan: Liraglutide -Weight Management (SAXENDA) 18 MG/3ML SOPN, Insulin Pen Needle (PEN NEEDLES) 32G X 5 MM MISC  Encounter for hepatitis C screening test for low risk patient - Plan: Hep C Antibody  Essential hypertension -Controlled -Continue lisinopril-hydrochlorothiazide 20-25 mg daily  History of sleeve gastrectomy in 2019 -Stable -Continue bariatric vitamins daily -Continue lifestyle modifications -Further weight loss noted since d/c'ing OCPs  IUD (intrauterine device) in place -Continue follow-up with OB/GYN  Class 1 obesity with serious comorbidity and body mass index (BMI) of 31.0 to 31.9 in adult, unspecified obesity  type -Continue lifestyle modifications  - Plan: Liraglutide -Weight Management (SAXENDA) 18 MG/3ML SOPN  Follow-up in 3 months  Grier Mitts, MD

## 2021-11-16 LAB — HEPATITIS C ANTIBODY
Hepatitis C Ab: NONREACTIVE
SIGNAL TO CUT-OFF: <0.02 (ref <1.00)

## 2021-11-21 DIAGNOSIS — F411 Generalized anxiety disorder: Secondary | ICD-10-CM | POA: Diagnosis not present

## 2021-11-28 DIAGNOSIS — F411 Generalized anxiety disorder: Secondary | ICD-10-CM | POA: Diagnosis not present

## 2021-12-05 DIAGNOSIS — F411 Generalized anxiety disorder: Secondary | ICD-10-CM | POA: Diagnosis not present

## 2021-12-12 DIAGNOSIS — F411 Generalized anxiety disorder: Secondary | ICD-10-CM | POA: Diagnosis not present

## 2021-12-18 DIAGNOSIS — F411 Generalized anxiety disorder: Secondary | ICD-10-CM | POA: Diagnosis not present

## 2021-12-26 DIAGNOSIS — F411 Generalized anxiety disorder: Secondary | ICD-10-CM | POA: Diagnosis not present

## 2021-12-28 ENCOUNTER — Encounter: Payer: Self-pay | Admitting: Family Medicine

## 2022-01-03 ENCOUNTER — Other Ambulatory Visit: Payer: Self-pay | Admitting: Family Medicine

## 2022-01-03 DIAGNOSIS — F321 Major depressive disorder, single episode, moderate: Secondary | ICD-10-CM

## 2022-01-03 DIAGNOSIS — F419 Anxiety disorder, unspecified: Secondary | ICD-10-CM

## 2022-01-16 DIAGNOSIS — F411 Generalized anxiety disorder: Secondary | ICD-10-CM | POA: Diagnosis not present

## 2022-01-23 DIAGNOSIS — F411 Generalized anxiety disorder: Secondary | ICD-10-CM | POA: Diagnosis not present

## 2022-01-30 DIAGNOSIS — F411 Generalized anxiety disorder: Secondary | ICD-10-CM | POA: Diagnosis not present

## 2022-02-06 DIAGNOSIS — F411 Generalized anxiety disorder: Secondary | ICD-10-CM | POA: Diagnosis not present

## 2022-02-20 DIAGNOSIS — F411 Generalized anxiety disorder: Secondary | ICD-10-CM | POA: Diagnosis not present

## 2022-02-27 DIAGNOSIS — F411 Generalized anxiety disorder: Secondary | ICD-10-CM | POA: Diagnosis not present

## 2022-03-04 ENCOUNTER — Other Ambulatory Visit: Payer: Self-pay | Admitting: Family Medicine

## 2022-03-04 DIAGNOSIS — I10 Essential (primary) hypertension: Secondary | ICD-10-CM

## 2022-03-06 DIAGNOSIS — F411 Generalized anxiety disorder: Secondary | ICD-10-CM | POA: Diagnosis not present

## 2022-03-13 DIAGNOSIS — F411 Generalized anxiety disorder: Secondary | ICD-10-CM | POA: Diagnosis not present

## 2022-03-14 ENCOUNTER — Encounter: Payer: Self-pay | Admitting: Family Medicine

## 2022-03-18 ENCOUNTER — Ambulatory Visit: Payer: BC Managed Care – PPO | Admitting: Family Medicine

## 2022-03-20 DIAGNOSIS — F411 Generalized anxiety disorder: Secondary | ICD-10-CM | POA: Diagnosis not present

## 2022-03-27 ENCOUNTER — Telehealth: Payer: Self-pay | Admitting: Family Medicine

## 2022-03-27 ENCOUNTER — Encounter: Payer: Self-pay | Admitting: Family Medicine

## 2022-03-27 ENCOUNTER — Ambulatory Visit: Payer: BC Managed Care – PPO | Admitting: Family Medicine

## 2022-03-27 VITALS — BP 111/76 | HR 82 | Temp 98.4°F | Wt 204.4 lb

## 2022-03-27 DIAGNOSIS — Z6832 Body mass index (BMI) 32.0-32.9, adult: Secondary | ICD-10-CM

## 2022-03-27 DIAGNOSIS — F411 Generalized anxiety disorder: Secondary | ICD-10-CM | POA: Diagnosis not present

## 2022-03-27 DIAGNOSIS — R234 Changes in skin texture: Secondary | ICD-10-CM | POA: Diagnosis not present

## 2022-03-27 DIAGNOSIS — Z903 Acquired absence of stomach [part of]: Secondary | ICD-10-CM

## 2022-03-27 DIAGNOSIS — Z789 Other specified health status: Secondary | ICD-10-CM

## 2022-03-27 DIAGNOSIS — E1139 Type 2 diabetes mellitus with other diabetic ophthalmic complication: Secondary | ICD-10-CM | POA: Diagnosis not present

## 2022-03-27 DIAGNOSIS — M7122 Synovial cyst of popliteal space [Baker], left knee: Secondary | ICD-10-CM | POA: Diagnosis not present

## 2022-03-27 DIAGNOSIS — E6609 Other obesity due to excess calories: Secondary | ICD-10-CM

## 2022-03-27 MED ORDER — SAXENDA 18 MG/3ML ~~LOC~~ SOPN: 2.4000 mg | PEN_INJECTOR | Freq: Every day | SUBCUTANEOUS | 2 refills | Status: DC

## 2022-03-27 NOTE — Telephone Encounter (Signed)
[facility] is calling and needs clarification on Liraglutide -Weight Management (SAXENDA) 18 MG/3ML SOPN  ?[place], [place] Phone:  [phone]  ?Fax:  [phone]  ?  ? ?

## 2022-03-27 NOTE — Progress Notes (Signed)
Subjective:  ? ? Patient ID: Brenda Acosta, female    DOB: 09-28-1976, 46 y.o.   MRN: [id number] ? ?No chief complaint on file. ? ? ?HPI ?Patient is a 46 yo female with pmh sig for h/o obesity s/p gastric sleeve 2019, DM II, HLD, HTN, was seen today for f/u on weight loss medication.  On saxenda since mid 2021 but feels it is no longer working.  Currently on the 3 mg/wk dose.  Wt 194s lbs Dec 2022, now 204 lbs.  Pt eating vegan diet with the exception of cheese.  States appetite is deceased on saxenda, may have a vegan breakfast sandwich, cheese, nuts, popcorn, grapes, nut thins crackers throughout the day.  Pt in bed by 11 pm or 12 am and up typically around 6 AM.  On the rare occasion will go to work around 2-3 AM.  Patient endorses taking multivitamins given history of gastric sleeve.  Patient notes firm spot underneath skin of abdomen near injection site, not painful.  Typically rotates injection sites on abdomen. ? ?Patient also notes history of left knee/kneecap Injury while in college.  Ever since pt has occasional instability of left knee.  States left knee buckled 3 weeks ago while walking on campus.  Knee is not painful but now has a visibly swollen area behind knee. ? ?Pt notes she had a great time on her trip to [place], but did not sleep much due to the time change. ? ?Past Medical History:  ?Diagnosis Date  ? Chest pain   ? a. 09/2015 Ex MV: EF >55%, no ischemia/infarct.  ? Diabetes mellitus without complication ([place])   ? a. 11/20/2015 A1c 11.5;  b.  noncompliant w/ diabetes meds, glucose checks, f/u, etc.  ? Dyspnea on exertion   ? a. 09/2015 Echo: Ef 60-65%, no rwma, mild AI, mildly dil LA.  ? Essential hypertension   ? Hyperlipidemia   ? Morbid obesity ([place])   ? Mucinous cystadenoma of ovary   ? Hx of   ? Noncompliance   ? ? ?Allergies  ?Allergen Reactions  ? Benzoyl Peroxide Other (See Comments)  ?  Burns   ? ?ROS ?General: Denies fever, chills, night sweats, changes in weight, changes in  appetite +weight gain ?HEENT: Denies headaches, ear pain, changes in vision, rhinorrhea, sore throat ?CV: Denies CP, palpitations, SOB, orthopnea ?Pulm: Denies SOB, cough, wheezing ?GI: Denies abdominal pain, nausea, vomiting, diarrhea, constipation ?GU: Denies dysuria, hematuria, frequency, vaginal discharge ?Msk: Denies muscle cramps, joint pains +L knee/kneecap intermittent instability ?Neuro: Denies weakness, numbness, tingling ?Skin: Denies rashes, bruising  +firm area of abdomen,: Bump behind left knee ?Psych: Denies depression, anxiety, hallucinations ?   ?Objective:  ?  ?Blood pressure 111/76, pulse 82, temperature 98.4 ?F (36.9 ?C), temperature source Oral, weight 204 lb 6.4 oz (92.7 kg), SpO2 100 %.Body mass index is 32.99 kg/m?. ? ?Gen. Pleasant, well-nourished, in no distress, normal affect   ?HEENT: [place]/AT, face symmetric, conjunctiva clear, no scleral icterus, PERRLA, EOMI, nares patent without drainage, pharynx without erythema or exudate. ?Neck: No JVD, no thyromegaly, no carotid bruits ?Lungs: no accessory muscle use, CTAB, no wheezes or rales ?Cardiovascular: RRR, no m/r/g, no peripheral edema ?Musculoskeletal: No deformities, no cyanosis or clubbing, normal tone ?Neuro:  A&Ox3, CN II-XII intact, normal gait ?Skin:  Warm, dry, intact.  Area of ecchymosis and an area of induration on right lower lateral abdomen.  No erythema, drainage, increased warmth. ? ? ?Wt Readings from Last 3 Encounters:  ?11/15/21  194 lb 12.8 oz (88.4 kg)  ?10/19/21 189 lb (85.7 kg)  ?09/14/21 195 lb 12.8 oz (88.8 kg)  ? ? ?Lab Results  ?Component Value Date  ? WBC 7.7 10/19/2021  ? HGB 12.9 10/19/2021  ? HCT 40.0 10/19/2021  ? PLT 496 (H) 10/19/2021  ? GLUCOSE 89 11/15/2021  ? CHOL 213 (H) 11/15/2021  ? TRIG 66.0 11/15/2021  ? HDL 53.00 11/15/2021  ? LDLCALC 147 (H) 11/15/2021  ? ALT 28 10/19/2021  ? AST 27 10/19/2021  ? NA 139 11/15/2021  ? K 3.9 11/15/2021  ? CL 105 11/15/2021  ? CREATININE 1.02 11/15/2021  ? BUN 20  11/15/2021  ? CO2 27 11/15/2021  ? TSH 0.82 11/15/2021  ? HGBA1C 7.0 (H) 11/15/2021  ? MICROALBUR <0.7 03/16/2020  ? ? ?Assessment/Plan: ? ?Type 2 diabetes mellitus with other ophthalmic complication, without long-term current use of insulin ([place]) ?-Stable ?-hgb A1C 7.0% on 11/15/21 ?-Currently on liraglutide 3 mg daily for weight loss but also helping with DM 2. ?-As weight has plateaued discussed weaning liraglutide.  Will decrease dose to 2.4 mg daily.  Will continue weaning dose of liraglutide by 0.6 mg monthly. ?-Continue lifestyle modifications ?-Continue ACE I ?- Plan: Liraglutide -Weight Management (SAXENDA) 18 MG/3ML SOPN ? ?Synovial cyst of left popliteal space ?-New problem ?-Discussed area of posterior left knee likely Baker's cyst caused by knee of patella injury. ?-Discussed supportive care including compression, ice, rest, Tylenol, topical analgesics as needed ?-Discussed strengthening patellar tendon strengthening muscles of thigh to help prevent further instability and patellar dislocation. ?-For continued or worsening symptoms obtain imaging. ?-Given handout ? ?Class 1 obesity due to excess calories with serious comorbidity and body mass index (BMI) of 32.0 to 32.9 in adult ?-Body mass index is 32.99 kg/m?. ?-Discussed various possible causes for recent weight gain. ?-Patient encouraged to increase protein intake and physical activity. ?-continue vegan diet with the exception of cheese. ?-As Saxenda seemingly no longer effective discussed weaning dose from 3 mg to 2.4 mg with plans to continue weaning by 0.6 mg monthly and ultimately stopping med completely. ? - Plan: Liraglutide -Weight Management (SAXENDA) 18 MG/3ML SOPN ? ?Induration at injection site ?-stable, without signs of infection ?-discussed rotating injection sites (abd, back of upper arms, and upper thighs) ?-massage area ? ?History of sleeve gastrectomy ?-Done in 2019 ?-Discussed the importance of daily MVIs ?-Continue follow-up with  surgeon ? ?Vegan diet ?-With the exception of cheese ?-Discussed continuing MVIs ?-Increase protein intake ? ?F/u in 2-3 months, sooner if needed. ? ?Grier Mitts, MD ?

## 2022-03-27 NOTE — Patient Instructions (Addendum)
Decrease your dose of Saxenda to 2.4 mg daily for 1 month.  After that month continue decreasing dose by 1.6 mg monthly..  A new prescription was sent to your pharmacy. ? ?Work on increasing your protein intake and physical activity. ? ?I have included some exercises for your legs to help strengthen your hamstrings/patellar tendon. ?

## 2022-03-29 NOTE — Telephone Encounter (Signed)
Spoke with Rosann Auerbach, from [employer], informed her pt is now on 3 and will decreasing to the 2.4 for a month, and then decreasing monthly.  ?

## 2022-04-02 DIAGNOSIS — Z30431 Encounter for routine checking of intrauterine contraceptive device: Secondary | ICD-10-CM | POA: Diagnosis not present

## 2022-04-03 DIAGNOSIS — F411 Generalized anxiety disorder: Secondary | ICD-10-CM | POA: Diagnosis not present

## 2022-04-10 DIAGNOSIS — F411 Generalized anxiety disorder: Secondary | ICD-10-CM | POA: Diagnosis not present

## 2022-04-17 DIAGNOSIS — F411 Generalized anxiety disorder: Secondary | ICD-10-CM | POA: Diagnosis not present

## 2022-04-20 DIAGNOSIS — K121 Other forms of stomatitis: Secondary | ICD-10-CM | POA: Diagnosis not present

## 2022-04-23 ENCOUNTER — Encounter: Payer: Self-pay | Admitting: Family Medicine

## 2022-04-23 ENCOUNTER — Telehealth: Payer: BC Managed Care – PPO | Admitting: Nurse Practitioner

## 2022-04-23 DIAGNOSIS — H10022 Other mucopurulent conjunctivitis, left eye: Secondary | ICD-10-CM

## 2022-04-23 MED ORDER — POLYMYXIN B-TRIMETHOPRIM 10000-0.1 UNIT/ML-% OP SOLN: 1.0000 [drp] | OPHTHALMIC | 0 refills | Status: AC

## 2022-04-23 NOTE — Progress Notes (Signed)
E-Visit for Brenda Acosta   We are sorry that you are not feeling well.  Here is how we plan to help!  Based on what you have shared with me it looks like you have conjunctivitis.  Conjunctivitis is a common inflammatory or infectious condition of the eye that is often referred to as "pink eye".  In most cases it is contagious (viral or bacterial). However, not all conjunctivitis requires antibiotics (ex. Allergic).  We have made appropriate suggestions for you based upon your presentation.  I have prescribed Polytrim Ophthalmic drops 1-2 drops 4 times a day times 5 days  Pink eye can be highly contagious.  It is typically spread through direct contact with secretions, or contaminated objects or surfaces that one may have touched.  Strict handwashing is suggested with soap and water is urged.  If not available, use alcohol based had sanitizer.  Avoid unnecessary touching of the eye.  If you wear contact lenses, you will need to refrain from wearing them until you see no white discharge from the eye for at least 24 hours after being on medication.  You should see symptom improvement in 1-2 days after starting the medication regimen.  Call us if symptoms are not improved in 1-2 days.  Home Care: Wash your hands often! Do not wear your contacts until you complete your treatment plan. Avoid sharing towels, bed linen, personal items with a person who has pink eye. See attention for anyone in your home with similar symptoms.  Get Help Right Away If: Your symptoms do not improve. You develop blurred or loss of vision. Your symptoms worsen (increased discharge, pain or redness)   Thank you for choosing an e-visit.  Your e-visit answers were reviewed by a board certified advanced clinical practitioner to complete your personal care plan. Depending upon the condition, your plan could have included both over the counter or prescription medications.  Please review your pharmacy choice. Make sure the  pharmacy is open so you can pick up prescription now. If there is a problem, you may contact your provider through [employer] and have the prescription routed to another pharmacy.  Your safety is important to [place]. If you have drug allergies check your prescription carefully.   For the next 24 hours you can use MyChart to ask questions about today's visit, request a non-urgent call back, or ask for a work or school excuse. You will get an email in the next two days asking about your experience. I hope that your e-visit has been valuable and will speed your recovery.  Meds ordered this encounter  Medications   trimethoprim-polymyxin b (POLYTRIM) ophthalmic solution    Sig: Place 1 drop into the left eye every 4 (four) hours while awake for 5 days.    Dispense:  10 mL    Refill:  0     I spent approximately 5 minutes reviewing the patient's history, current symptoms and coordinating their plan of care today.

## 2022-04-23 NOTE — Telephone Encounter (Signed)
PA was denied, new PA/appeal started [place] [id number].

## 2022-04-24 DIAGNOSIS — F411 Generalized anxiety disorder: Secondary | ICD-10-CM | POA: Diagnosis not present

## 2022-05-01 DIAGNOSIS — F411 Generalized anxiety disorder: Secondary | ICD-10-CM | POA: Diagnosis not present

## 2022-05-08 DIAGNOSIS — F411 Generalized anxiety disorder: Secondary | ICD-10-CM | POA: Diagnosis not present

## 2022-05-09 ENCOUNTER — Encounter: Payer: Self-pay | Admitting: Family Medicine

## 2022-05-09 ENCOUNTER — Other Ambulatory Visit: Payer: Self-pay

## 2022-05-09 ENCOUNTER — Inpatient Hospital Stay: Payer: BC Managed Care – PPO | Attending: Oncology

## 2022-05-09 DIAGNOSIS — D75839 Thrombocytosis, unspecified: Secondary | ICD-10-CM

## 2022-05-09 DIAGNOSIS — D649 Anemia, unspecified: Secondary | ICD-10-CM | POA: Insufficient documentation

## 2022-05-09 DIAGNOSIS — Z79899 Other long term (current) drug therapy: Secondary | ICD-10-CM | POA: Diagnosis not present

## 2022-05-09 LAB — IRON AND TIBC
Iron: 76 ug/dL (ref 28–170)
Saturation Ratios: 20 % (ref 10.4–31.8)
TIBC: 379 ug/dL (ref 250–450)
UIBC: 303 ug/dL

## 2022-05-09 LAB — COMPREHENSIVE METABOLIC PANEL
ALT: 16 U/L (ref 0–44)
AST: 18 U/L (ref 15–41)
Albumin: 3.4 g/dL — ABNORMAL LOW (ref 3.5–5.0)
Alkaline Phosphatase: 50 U/L (ref 38–126)
Anion gap: 7 (ref 5–15)
BUN: 18 mg/dL (ref 6–20)
CO2: 26 mmol/L (ref 22–32)
Calcium: 8.8 mg/dL — ABNORMAL LOW (ref 8.9–10.3)
Chloride: 104 mmol/L (ref 98–111)
Creatinine, Ser: 0.89 mg/dL (ref 0.44–1.00)
GFR, Estimated: >60 mL/min (ref >60)
Glucose, Bld: 207 mg/dL — ABNORMAL HIGH (ref 70–99)
Potassium: 3.8 mmol/L (ref 3.5–5.1)
Sodium: 137 mmol/L (ref 135–145)
Total Bilirubin: 0.7 mg/dL (ref 0.3–1.2)
Total Protein: 7 g/dL (ref 6.5–8.1)

## 2022-05-09 LAB — CBC WITH DIFFERENTIAL/PLATELET
Abs Immature Granulocytes: 0.02 10*3/uL (ref 0.00–0.07)
Basophils Absolute: 0.1 10*3/uL (ref 0.0–0.1)
Basophils Relative: 1 %
Eosinophils Absolute: 0.3 10*3/uL (ref 0.0–0.5)
Eosinophils Relative: 4 %
HCT: 33.8 % — ABNORMAL LOW (ref 36.0–46.0)
Hemoglobin: 11.3 g/dL — ABNORMAL LOW (ref 12.0–15.0)
Immature Granulocytes: 0 %
Lymphocytes Relative: 40 %
Lymphs Abs: 3.1 10*3/uL (ref 0.7–4.0)
MCH: 29.9 pg (ref 26.0–34.0)
MCHC: 33.4 g/dL (ref 30.0–36.0)
MCV: 89.4 fL (ref 80.0–100.0)
Monocytes Absolute: 0.3 10*3/uL (ref 0.1–1.0)
Monocytes Relative: 4 %
Neutro Abs: 4.1 10*3/uL (ref 1.7–7.7)
Neutrophils Relative %: 51 %
Platelets: 392 10*3/uL (ref 150–400)
RBC: 3.78 MIL/uL — ABNORMAL LOW (ref 3.87–5.11)
RDW: 11.7 % (ref 11.5–15.5)
WBC: 7.9 10*3/uL (ref 4.0–10.5)
nRBC: 0 % (ref 0.0–0.2)

## 2022-05-09 LAB — FERRITIN: Ferritin: 14 ng/mL (ref 11–307)

## 2022-05-13 ENCOUNTER — Other Ambulatory Visit: Payer: BC Managed Care – PPO

## 2022-05-14 ENCOUNTER — Encounter: Payer: Self-pay | Admitting: Oncology

## 2022-05-14 ENCOUNTER — Inpatient Hospital Stay (HOSPITAL_BASED_OUTPATIENT_CLINIC_OR_DEPARTMENT_OTHER): Payer: BC Managed Care – PPO | Admitting: Oncology

## 2022-05-14 DIAGNOSIS — D75839 Thrombocytosis, unspecified: Secondary | ICD-10-CM

## 2022-05-14 DIAGNOSIS — Z903 Acquired absence of stomach [part of]: Secondary | ICD-10-CM | POA: Diagnosis not present

## 2022-05-14 DIAGNOSIS — D649 Anemia, unspecified: Secondary | ICD-10-CM | POA: Diagnosis not present

## 2022-05-14 MED ORDER — IRON-VITAMIN C 65-125 MG PO TABS: 1.0000 | ORAL_TABLET | Freq: Every day | ORAL | 2 refills | Status: DC

## 2022-05-14 NOTE — Progress Notes (Addendum)
HEMATOLOGY-ONCOLOGY TeleHEALTH VISIT PROGRESS NOTE  I connected with Brenda Acosta on 05/14/22  at  2:45 PM EDT by video enabled telemedicine visit and verified that I am speaking with the correct person using two identifiers. I discussed the limitations, risks, security and privacy concerns of performing an evaluation and management service by telemedicine and the availability of in-person appointments. The patient expressed understanding and agreed to proceed.   Other persons participating in the visit and their role in the encounter:  None  Patient's location: Home  Provider's location: office Chief Complaint: Thrombocytosis   INTERVAL HISTORY Brenda Acosta is a 46 y.o. female who has above history reviewed by me today presents for follow up visit for thrombocytosis Patient was advised to take oral iron supplementation for treatment of early iron deficiency.   Patient forgets to start.  Patient has no new complaints. She eats lots of vegetable.Denies weight loss, fever, chills, fatigue, night sweats.    Past Medical History:  Diagnosis Date   Chest pain    a. 09/2015 Ex MV: EF >55%, no ischemia/infarct.   Diabetes mellitus without complication ([place])    a. 11/20/2015 A1c 11.5;  b.  noncompliant w/ diabetes meds, glucose checks, f/u, etc.   Dyspnea on exertion    a. 09/2015 Echo: Ef 60-65%, no rwma, mild AI, mildly dil LA.   Essential hypertension    Hyperlipidemia    Morbid obesity ([place])    Mucinous cystadenoma of ovary    Hx of    Noncompliance    Past Surgical History:  Procedure Laterality Date   ABDOMINAL HYSTERECTOMY     partial hyst   coloscopy     LAPAROSCOPIC GASTRIC SLEEVE RESECTION  01/08/2018   MOUTH SURGERY     OOPHORECTOMY  07/02/2006   Rt; large benign ovarian tumor    Family History  Problem Relation Age of Onset   Heart disease Mother 57       heart attack   Hypertension Father    Diabetes Father        Type II   Cancer Maternal  Grandfather        prostate cancer   Colon cancer Neg Hx    Rectal cancer Neg Hx    Stomach cancer Neg Hx     Social History   Socioeconomic History   Marital status: Single    Spouse name: Not on file   Number of children: 0   Years of education: Not on file   Highest education level: Doctorate  Occupational History    Employer: [employer]  Tobacco Use   Smoking status: Never   Smokeless tobacco: Never  Vaping Use   Vaping Use: Never used  Substance and Sexual Activity   Alcohol use: No   Drug use: No   Sexual activity: Yes    Partners: Male  Other Topics Concern   Not on file  Social History Narrative   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 Avg. Glucose: 298      Regular exercise: no   Caffeine use: occassionally                  Social Determinants of Health   Financial Resource Strain: Low Risk  (03/18/2022)   Overall Financial Resource Strain (CARDIA)  Difficulty of Paying Living Expenses: Not very hard  Food Insecurity: No Food Insecurity (03/18/2022)   Hunger Vital Sign    Worried About Running Out of Food in the Last Year: Never true    Ran Out of Food in the Last Year: Never true  Transportation Needs: No Transportation Needs (03/18/2022)   PRAPARE - Hydrologist (Medical): No    Lack of Transportation (Non-Medical): No  Physical Activity: Unknown (03/18/2022)   Exercise Vital Sign    Days of Exercise per Week: 0 days    Minutes of Exercise per Session: Not on file  Stress: No Stress Concern Present (03/18/2022)   [place]    Feeling of Stress : Only a little  Social Connections: Moderately Integrated (03/18/2022)   Social Connection and Isolation Panel [NHANES]    Frequency of Communication with  Friends and Family: More than three times a week    Frequency of Social Gatherings with Friends and Family: More than three times a week    Attends Religious Services: More than 4 times per year    Active Member of [employer] or Organizations: Yes    Attends Archivist Meetings: Not on file    Marital Status: Never married  Intimate Partner Violence: Not on file    Current Outpatient Medications on File Prior to Visit  Medication Sig Dispense Refill   BAYER MICROLET LANCETS lancets Use to test blood sugar 2 time daily as instructed. 100 each 5   Blood Glucose Monitoring Suppl (BAYER CONTOUR NEXT MONITOR) w/Device KIT Use to test blood sugar daily. 1 kit 0   CONTOUR NEXT TEST test strip USE 1 STRIP TO CHECK GLUCOSE TWICE DAILY AS DIRECTED 100 each 0   furosemide (LASIX) 20 MG tablet Take one tab daily prn for lower extremity edema 30 tablet 3   hydrOXYzine (ATARAX/VISTARIL) 25 MG tablet Take 1 tablet (25 mg total) by mouth at bedtime as needed for anxiety. 30 tablet 3   Insulin Pen Needle (PEN NEEDLES) 32G X 5 MM MISC For daily use. 100 each 2   Liraglutide -Weight Management (SAXENDA) 18 MG/3ML SOPN Inject 2.4 mg into the skin daily. 30 mL 2   lisinopril-hydrochlorothiazide (ZESTORETIC) 20-25 MG tablet Take 1 tablet by mouth once daily 90 tablet 1   senna-docusate (SENOKOT-S) 8.6-50 MG tablet Take 1 tablet by mouth daily. 30 tablet 1   No current facility-administered medications on file prior to visit.    Allergies  Allergen Reactions   Benzoyl Peroxide Other (See Comments)    Burns        Observations/Objective: Today's Vitals   05/14/22 1509  PainSc: 0-No pain   There is no height or weight on file to calculate BMI.  Physical Exam Neurological:     Mental Status: She is alert.     CBC    Component Value Date/Time   WBC 7.9 05/09/2022 0804   RBC 3.78 (L) 05/09/2022 0804   HGB 11.3 (L) 05/09/2022 0804   HGB 10.7 (L) 03/22/2013 0838   HCT 33.8 (L) 05/09/2022 0804    HCT 32.8 (L) 03/22/2013 0838   PLT 392 05/09/2022 0804   PLT 470 (H) 03/22/2013 0838   MCV 89.4 05/09/2022 0804   MCV 80 03/22/2013 0838   MCH 29.9 05/09/2022 0804   MCHC 33.4 05/09/2022 0804   RDW 11.7 05/09/2022 0804   RDW 14.9 (H) 03/22/2013 0838   LYMPHSABS 3.1 05/09/2022  0804   MONOABS 0.3 05/09/2022 0804   EOSABS 0.3 05/09/2022 0804   BASOSABS 0.1 05/09/2022 0804    CMP     Component Value Date/Time   NA 137 05/09/2022 0804   NA 137 03/22/2013 0838   K 3.8 05/09/2022 0804   K 4.0 03/22/2013 0838   CL 104 05/09/2022 0804   CL 108 (H) 03/22/2013 0838   CO2 26 05/09/2022 0804   CO2 22 03/22/2013 0838   GLUCOSE 207 (H) 05/09/2022 0804   GLUCOSE 246 (H) 03/22/2013 0838   BUN 18 05/09/2022 0804   BUN 13 03/22/2013 0838   CREATININE 0.89 05/09/2022 0804   CREATININE 1.15 (H) 11/02/2020 0926   CALCIUM 8.8 (L) 05/09/2022 0804   CALCIUM 8.5 03/22/2013 0838   PROT 7.0 05/09/2022 0804   ALBUMIN 3.4 (L) 05/09/2022 0804   AST 18 05/09/2022 0804   ALT 16 05/09/2022 0804   ALKPHOS 50 05/09/2022 0804   BILITOT 0.7 05/09/2022 0804   GFRNONAA >60 05/09/2022 0804   GFRNONAA 58 (L) 11/02/2020 0926   GFRAA 67 11/02/2020 0926     Assessment and Plan: 1. Thrombocytosis   2. H/O gastric sleeve   3. 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 BCR ABL negative.  Less likely primary myeloproliferative disorders Labs reviewed and discussed with patient Platelet count has normalized.  #Anemia, slight decrease of hemoglobin.  Iron panel showed ferritin 14, decreased from 17 at the last visit.  I recommend patient to take vitamin C daily for 3 months.  We will recheck CBC.  History of gastric sleeve resection  Follow Up Instructions: 3-4 months   I discussed the assessment and treatment plan with the patient. The patient was provided an opportunity to ask  questions and all were answered. The patient agreed with the plan and demonstrated an understanding of the instructions. The patient was advised to call back or seek an in-person evaluation if the symptoms worsen or if the condition fails to improve as anticipated.   Total face to face encounter time for this patient visit was 15 min. >50% of the time was spent in counseling and coordination of care.    Earlie Server, MD 05/14/2022 9:06 PM

## 2022-05-15 DIAGNOSIS — F411 Generalized anxiety disorder: Secondary | ICD-10-CM | POA: Diagnosis not present

## 2022-05-20 ENCOUNTER — Telehealth: Payer: Self-pay

## 2022-05-20 NOTE — Telephone Encounter (Signed)
PA started for [facility]

## 2022-05-21 NOTE — Telephone Encounter (Signed)
PA denied for saxenda.

## 2022-05-22 DIAGNOSIS — F411 Generalized anxiety disorder: Secondary | ICD-10-CM | POA: Diagnosis not present

## 2022-05-23 ENCOUNTER — Telehealth: Payer: BC Managed Care – PPO | Admitting: Physician Assistant

## 2022-05-23 DIAGNOSIS — J069 Acute upper respiratory infection, unspecified: Secondary | ICD-10-CM | POA: Diagnosis not present

## 2022-05-23 MED ORDER — BENZONATATE 100 MG PO CAPS: 100.0000 mg | ORAL_CAPSULE | Freq: Three times a day (TID) | ORAL | 0 refills | Status: DC | PRN

## 2022-05-23 MED ORDER — FLUTICASONE PROPIONATE 50 MCG/ACT NA SUSP: 2.0000 | Freq: Every day | NASAL | 0 refills | Status: DC

## 2022-05-23 NOTE — Progress Notes (Signed)
E-Visit for Upper Respiratory Infection   We are sorry you are not feeling well.  Here is how we plan to help!  Based on what you have shared with me, it looks like you may have a viral upper respiratory infection.  Upper respiratory infections are caused by a large number of viruses; however, rhinovirus is the most common cause. Thankfully this does not seem consistent with influenza as we are out of flu season and giving absence of higher fever, body aches, etc.   Symptoms vary from person to person, with common symptoms including sore throat, cough, fatigue or lack of energy and feeling of general discomfort.  A low-grade fever of up to 100.4 may present, but is often uncommon.  Symptoms vary however, and are closely related to a person's age or underlying illnesses.  The most common symptoms associated with an upper respiratory infection are nasal discharge or congestion, cough, sneezing, headache and pressure in the ears and face.  These symptoms usually persist for about 3 to 10 days, but can last up to 2 weeks.  It is important to know that upper respiratory infections do not cause serious illness or complications in most cases.    Upper respiratory infections can be transmitted from person to person, with the most common method of transmission being a person's hands.  The virus is able to live on the skin and can infect other persons for up to 2 hours after direct contact.  Also, these can be transmitted when someone coughs or sneezes; thus, it is important to cover the mouth to reduce this risk.  To keep the spread of the illness at [place], good hand hygiene is very important.  This is an infection that is most likely caused by a virus. There are no specific treatments other than to help you with the symptoms until the infection runs its course.  We are sorry you are not feeling well.  Here is how we plan to help!   For nasal congestion, you may use an oral decongestants such as Mucinex D or if  you have glaucoma or high blood pressure use plain Mucinex.  Saline nasal spray or nasal drops can help and can safely be used as often as needed for congestion.  For your congestion, I have prescribed Fluticasone nasal spray one spray in each nostril twice a day  If you do not have a history of heart disease, hypertension, diabetes or thyroid disease, prostate/bladder issues or glaucoma, you may also use Sudafed to treat nasal congestion.  It is highly recommended that you consult with a pharmacist or your primary care physician to ensure this medication is safe for you to take.     If you have a cough, you may use cough suppressants such as Delsym and Robitussin.  If you have glaucoma or high blood pressure, you can also use Coricidin HBP.   For cough I have prescribed for you A prescription cough medication called Tessalon Perles 100 mg. You may take 1-2 capsules every 8 hours as needed for cough  If you have a sore or scratchy throat, use a saltwater gargle-  to  teaspoon of salt dissolved in a 4-ounce to 8-ounce glass of warm water.  Gargle the solution for approximately 15-30 seconds and then spit.  It is important not to swallow the solution.  You can also use throat lozenges/cough drops and Chloraseptic spray to help with throat pain or discomfort.  Warm or cold liquids can also be helpful  in relieving throat pain.  For headache, pain or general discomfort, you can use Ibuprofen or Tylenol as directed.   Some authorities believe that zinc sprays or the use of Echinacea may shorten the course of your symptoms.  Providers prescribe antibiotics to treat infections caused by bacteria. Antibiotics are very powerful in treating bacterial infections when they are used properly. To maintain their effectiveness, they should be used only when necessary. Overuse of antibiotics has resulted in the development of superbugs that are resistant to treatment!    After careful review of your answers, I would  not recommend an antibiotic for your condition.  Antibiotics are not effective against viruses and therefore should not be used to treat them. Common examples of infections caused by viruses include colds and flu    HOME CARE Only take medications as instructed by your medical team. Be sure to drink plenty of fluids. Water is fine as well as fruit juices, sodas and electrolyte beverages. You may want to stay away from caffeine or alcohol. If you are nauseated, try taking small sips of liquids. How do you know if you are getting enough fluid? Your urine should be a pale yellow or almost colorless. Get rest. Taking a steamy shower or using a humidifier may help nasal congestion and ease sore throat pain. You can place a towel over your head and breathe in the steam from hot water coming from a faucet. Using a saline nasal spray works much the same way. Cough drops, hard candies and sore throat lozenges may ease your cough. Avoid close contacts especially the very young and the elderly Cover your mouth if you cough or sneeze Always remember to wash your hands.   GET HELP RIGHT AWAY IF: You develop worsening fever. If your symptoms do not improve within 10 days You develop yellow or green discharge from your nose over 3 days. You have coughing fits You develop a severe head ache or visual changes. You develop shortness of breath, difficulty breathing or start having chest pain Your symptoms persist after you have completed your treatment plan  MAKE SURE YOU  Understand these instructions. Will watch your condition. Will get help right away if you are not doing well or get worse.  Thank you for choosing an e-visit.  Your e-visit answers were reviewed by a board certified advanced clinical practitioner to complete your personal care plan. Depending upon the condition, your plan could have included both over the counter or prescription medications.  Please review your pharmacy choice. Make  sure the pharmacy is open so you can pick up prescription now. If there is a problem, you may contact your provider through [employer] and have the prescription routed to another pharmacy.  Your safety is important to [place]. If you have drug allergies check your prescription carefully.   For the next 24 hours you can use MyChart to ask questions about today's visit, request a non-urgent call back, or ask for a work or school excuse. You will get an email in the next two days asking about your experience. I hope that your e-visit has been valuable and will speed your recovery.

## 2022-05-29 ENCOUNTER — Encounter: Payer: Self-pay | Admitting: Family Medicine

## 2022-05-29 DIAGNOSIS — F411 Generalized anxiety disorder: Secondary | ICD-10-CM | POA: Diagnosis not present

## 2022-05-31 ENCOUNTER — Encounter: Payer: Self-pay | Admitting: Family Medicine

## 2022-05-31 ENCOUNTER — Telehealth (INDEPENDENT_AMBULATORY_CARE_PROVIDER_SITE_OTHER): Payer: BC Managed Care – PPO | Admitting: Family Medicine

## 2022-05-31 VITALS — HR 83

## 2022-05-31 DIAGNOSIS — E1165 Type 2 diabetes mellitus with hyperglycemia: Secondary | ICD-10-CM | POA: Diagnosis not present

## 2022-05-31 MED ORDER — TIRZEPATIDE 2.5 MG/0.5ML ~~LOC~~ SOAJ: 2.5000 mg | SUBCUTANEOUS | 0 refills | Status: DC

## 2022-05-31 MED ORDER — TIRZEPATIDE 5 MG/0.5ML ~~LOC~~ SOAJ: 5.0000 mg | SUBCUTANEOUS | 1 refills | Status: DC

## 2022-05-31 NOTE — Progress Notes (Signed)
Virtual Visit via Video Note  I connected with Brenda Acosta on 05/31/22 at  1:30 PM EDT by a video enabled telemedicine application 2/2 [id number] pandemic and verified that I am speaking with the correct person using two identifiers.  Location patient: home Location provider:work or home office Persons participating in the virtual visit: patient, provider  I discussed the limitations of evaluation and management by telemedicine and the availability of in person appointments. The patient expressed understanding and agreed to proceed.   HPI:  Pt concerned about her bs increasing and wt gain since no longer being on saxenda.  Pt off Saxenda x8 weeks.  Weight now 209 lbs.  BS was 180 at recent appt.  Fasting blood sugar while on Saxenda was 80-100.  Pt notes appetite has increased some as saxenda previously suppressed it.  Pt still eating a vegan diet.  Eating fruit, cheese, cashew chips for a snack.   ROS: See pertinent positives and negatives per HPI.  Past Medical History:  Diagnosis Date   Chest pain    a. 09/2015 Ex MV: EF >55%, no ischemia/infarct.   Diabetes mellitus without complication ([place])    a. 11/20/2015 A1c 11.5;  b.  noncompliant w/ diabetes meds, glucose checks, f/u, etc.   Dyspnea on exertion    a. 09/2015 Echo: Ef 60-65%, no rwma, mild AI, mildly dil LA.   Essential hypertension    Hyperlipidemia    Morbid obesity ([place])    Mucinous cystadenoma of ovary    Hx of    Noncompliance     Past Surgical History:  Procedure Laterality Date   ABDOMINAL HYSTERECTOMY     partial hyst   coloscopy     LAPAROSCOPIC GASTRIC SLEEVE RESECTION  01/08/2018   MOUTH SURGERY     OOPHORECTOMY  07/02/2006   Rt; large benign ovarian tumor    Family History  Problem Relation Age of Onset   Heart disease Mother 69       heart attack   Hypertension Father    Diabetes Father        Type II   Cancer Maternal Grandfather        prostate cancer   Colon cancer Neg Hx    Rectal  cancer Neg Hx    Stomach cancer Neg Hx      Current Outpatient Medications:    BAYER MICROLET LANCETS lancets, Use to test blood sugar 2 time daily as instructed., Disp: 100 each, Rfl: 5   benzonatate (TESSALON) 100 MG capsule, Take 1 capsule (100 mg total) by mouth 3 (three) times daily as needed for cough., Disp: 30 capsule, Rfl: 0   Blood Glucose Monitoring Suppl (BAYER CONTOUR NEXT MONITOR) w/Device KIT, Use to test blood sugar daily., Disp: 1 kit, Rfl: 0   CONTOUR NEXT TEST test strip, USE 1 STRIP TO CHECK GLUCOSE TWICE DAILY AS DIRECTED, Disp: 100 each, Rfl: 0   fluticasone (FLONASE) 50 MCG/ACT nasal spray, Place 2 sprays into both nostrils daily., Disp: 16 g, Rfl: 0   furosemide (LASIX) 20 MG tablet, Take one tab daily prn for lower extremity edema, Disp: 30 tablet, Rfl: 3   hydrOXYzine (ATARAX/VISTARIL) 25 MG tablet, Take 1 tablet (25 mg total) by mouth at bedtime as needed for anxiety., Disp: 30 tablet, Rfl: 3   Insulin Pen Needle (PEN NEEDLES) 32G X 5 MM MISC, For daily use., Disp: 100 each, Rfl: 2   Iron-Vitamin C 65-125 MG TABS, Take 1 tablet by mouth daily., Disp: 30 tablet,  Rfl: 2   lisinopril-hydrochlorothiazide (ZESTORETIC) 20-25 MG tablet, Take 1 tablet by mouth once daily, Disp: 90 tablet, Rfl: 1   senna-docusate (SENOKOT-S) 8.6-50 MG tablet, Take 1 tablet by mouth daily., Disp: 30 tablet, Rfl: 1   Liraglutide -Weight Management (SAXENDA) 18 MG/3ML SOPN, Inject 2.4 mg into the skin daily. (Patient not taking: Reported on 05/31/2022), Disp: 30 mL, Rfl: 2  EXAM:  VITALS per patient if applicable: wt 817 lbs.     GENERAL: alert, oriented, appears well and in no acute distress  HEENT: atraumatic, conjunctiva clear, no obvious abnormalities on inspection of external nose and ears  NECK: normal movements of the head and neck  LUNGS: on inspection no signs of respiratory distress, breathing rate appears normal, no obvious gross SOB, gasping or wheezing  CV: no obvious  cyanosis  MS: moves all visible extremities without noticeable abnormality  PSYCH/NEURO: pleasant and cooperative, no obvious depression or anxiety, speech and thought processing grossly intact  ASSESSMENT AND PLAN:  Discussed the following assessment and plan:  Type 2 diabetes mellitus with hyperglycemia, without long-term current use of insulin (HCC) -Hemoglobin A1c was 7.0% on 11/15/2021 -Blood glucose 207 on 05/09/2022 -PA for Saxenda denied by insurance in the last few months. -Given increasing blood sugar restarting medication for diabetes. -Will start Mounjaro 2.5 mg weekly x 4 wks, then increase dose to 5 mg wkly.  RX for both doses sent to pharmacy.  Patient aware may require PA. -Continue checking BS  - Plan: tirzepatide (MOUNJARO) 2.5 MG/0.5ML Pen, tirzepatide (MOUNJARO) 5 MG/0.5ML Pen  F/u in 4-6 wks   I discussed the assessment and treatment plan with the patient. The patient was provided an opportunity to ask questions and all were answered. The patient agreed with the plan and demonstrated an understanding of the instructions.   The patient was advised to call back or seek an in-person evaluation if the symptoms worsen or if the condition fails to improve as anticipated.     [name], MD

## 2022-06-05 ENCOUNTER — Encounter: Payer: Self-pay | Admitting: Family Medicine

## 2022-06-05 ENCOUNTER — Ambulatory Visit (INDEPENDENT_AMBULATORY_CARE_PROVIDER_SITE_OTHER): Payer: BC Managed Care – PPO | Admitting: Family Medicine

## 2022-06-05 VITALS — BP 100/70 | HR 87 | Temp 98.5°F | Wt 211.8 lb

## 2022-06-05 DIAGNOSIS — F411 Generalized anxiety disorder: Secondary | ICD-10-CM | POA: Diagnosis not present

## 2022-06-05 DIAGNOSIS — M25552 Pain in left hip: Secondary | ICD-10-CM

## 2022-06-05 DIAGNOSIS — Z7689 Persons encountering health services in other specified circumstances: Secondary | ICD-10-CM | POA: Diagnosis not present

## 2022-06-05 DIAGNOSIS — M654 Radial styloid tenosynovitis [de Quervain]: Secondary | ICD-10-CM | POA: Diagnosis not present

## 2022-06-05 DIAGNOSIS — M25531 Pain in right wrist: Secondary | ICD-10-CM | POA: Diagnosis not present

## 2022-06-05 MED ORDER — MELOXICAM 7.5 MG PO TABS: 7.5000 mg | ORAL_TABLET | Freq: Every day | ORAL | 0 refills | Status: DC

## 2022-06-05 MED ORDER — PREDNISONE 10 MG PO TABS: ORAL_TABLET | ORAL | 0 refills | Status: DC

## 2022-06-05 NOTE — Progress Notes (Signed)
Subjective:    Patient ID: Brenda Acosta, female    DOB: 01/15/1976, 46 y.o.   MRN: [id number]  Chief Complaint  Patient presents with   Wrist Pain    Pt c/o of R wrist pain. Been going on for 3-4 days ago. Pain worse when move the wrist   Hip Pain    Pt c/o of L hip pain. Patient stated she can layed directly on that side. Been going on for about a year.     HPI Patient was seen today for acute concerns.  Pt with intense lateral R wrist pain x 3-4 days.  Symptoms started over the weekend after moving [place] furniture for a cookout.  Pt notes pain in wrist with moving thumb, and w/ lateral movement of hand.  Patient concerned about symptoms as she leaves for 12-day trip to [place] in 6 days.  Pt also notes L hip pain x 1 yr when laying on L side only at night.  Only able to lay on L side for a few minutes before having to roll over due to the discomfort.  Patient denies pain when walking, sitting, or moving around during the day.  Has an adjustable bed.  Patient states she started [employer] on Monday (3 days ago) and has already lost 2 pounds.  Patient plans to go back on a vegan diet.  Eating less cheese at night.  Will have 1 serving of fruit per day. Past Medical History:  Diagnosis Date   Chest pain    a. 09/2015 Ex MV: EF >55%, no ischemia/infarct.   Diabetes mellitus without complication ([place])    a. 11/20/2015 A1c 11.5;  b.  noncompliant w/ diabetes meds, glucose checks, f/u, etc.   Dyspnea on exertion    a. 09/2015 Echo: Ef 60-65%, no rwma, mild AI, mildly dil LA.   Essential hypertension    Hyperlipidemia    Morbid obesity (HCC)    Mucinous cystadenoma of ovary    Hx of    Noncompliance     Allergies  Allergen Reactions   Benzoyl Peroxide Other (See Comments)    Burns     ROS General: Denies fever, chills, night sweats +changes in weight, changes in appetite HEENT: Denies headaches, ear pain, changes in vision, rhinorrhea, sore throat CV: Denies CP,  palpitations, SOB, orthopnea Pulm: Denies SOB, cough, wheezing GI: Denies abdominal pain, nausea, vomiting, diarrhea, constipation GU: Denies dysuria, hematuria, frequency, vaginal discharge Msk: Denies muscle cramps, joint pains + lateral right wrist pain, edema.  Positional left hip pain Neuro: Denies weakness, numbness, tingling Skin: Denies rashes, bruising Psych: Denies depression, anxiety, hallucinations     Objective:    Blood pressure 100/70, pulse 87, temperature 98.5 F (36.9 C), temperature source Oral, weight 211 lb 12.8 oz (96.1 kg), SpO2 98 %.  Gen. Pleasant, well-nourished, in no distress, normal affect   HEENT: [place]/AT, face symmetric, conjunctiva clear, no scleral icterus, PERRLA, EOMI, nares patent without drainage Lungs: no accessory muscle use, CTAB, no wheezes or rales Cardiovascular: RRR, no m/r/g, no peripheral edema Musculoskeletal: Visible edema of the right wrist and knot over abductor pollicis longus extensor/extensor pollicis brevis tendon on lateral right wrist.  Discomfort with movement of right thumb.  No TTP of left hip.  No cyanosis or clubbing, normal tone Neuro:  A&Ox3, CN II-XII intact, normal gait Skin:  Warm, no lesions/ rash   Wt Readings from Last 3 Encounters:  06/05/22 211 lb 12.8 oz (96.1 kg)  03/27/22 204 lb  6.4 oz (92.7 kg)  11/15/21 194 lb 12.8 oz (88.4 kg)    Lab Results  Component Value Date   WBC 7.9 05/09/2022   HGB 11.3 (L) 05/09/2022   HCT 33.8 (L) 05/09/2022   PLT 392 05/09/2022   GLUCOSE 207 (H) 05/09/2022   CHOL 213 (H) 11/15/2021   TRIG 66.0 11/15/2021   HDL 53.00 11/15/2021   LDLCALC 147 (H) 11/15/2021   ALT 16 05/09/2022   AST 18 05/09/2022   NA 137 05/09/2022   K 3.8 05/09/2022   CL 104 05/09/2022   CREATININE 0.89 05/09/2022   BUN 18 05/09/2022   CO2 26 05/09/2022   TSH 0.82 11/15/2021   HGBA1C 7.0 (H) 11/15/2021   MICROALBUR <0.7 03/16/2020    Assessment/Plan:  Tennis Must Quervain's tenosynovitis, right -New  problem -Likely 2/2 overuse from moving furniture this weekend -Discussed supportive care including rest, ice, support Ace bandage versus wrist splint, NSAIDs -Patient encouraged to limit activity to help reduce inflammation in lateral wrist. -We will start 8-day prednisone taper.  Discussed r/b/a. -Mobic as needed - Plan: predniSONE (DELTASONE) 10 MG tablet, meloxicam (MOBIC) 7.5 MG tablet  Right wrist pain -2/2 de Quervain's tenosynovitis likely from overuse when moving furniture over the weekend  Left hip pain -Discussed ways to reduce pressure on hip including adjusting firmness of mattress and using a pillow or towel between knees for support. -Continue to monitor -For continued or worsening symptoms during the day obtain imaging.  Encounter for weight management -Body mass index is 34.19 kg/m. -213 lbs prior to starting Mounjaro 2.5 mg this week.  2 pound weight loss already noted. -Continue to monitor weekly -Continue lifestyle modifications  F/u as needed  Grier Mitts, MD

## 2022-06-26 ENCOUNTER — Telehealth: Payer: Self-pay

## 2022-06-26 ENCOUNTER — Ambulatory Visit: Payer: BC Managed Care – PPO | Admitting: Family Medicine

## 2022-06-26 VITALS — BP 96/70 | HR 86 | Temp 98.5°F | Wt 206.6 lb

## 2022-06-26 DIAGNOSIS — E1139 Type 2 diabetes mellitus with other diabetic ophthalmic complication: Secondary | ICD-10-CM | POA: Diagnosis not present

## 2022-06-26 DIAGNOSIS — M654 Radial styloid tenosynovitis [de Quervain]: Secondary | ICD-10-CM | POA: Diagnosis not present

## 2022-06-26 DIAGNOSIS — F411 Generalized anxiety disorder: Secondary | ICD-10-CM | POA: Diagnosis not present

## 2022-06-26 DIAGNOSIS — Z7689 Persons encountering health services in other specified circumstances: Secondary | ICD-10-CM | POA: Diagnosis not present

## 2022-06-26 LAB — POCT GLYCOSYLATED HEMOGLOBIN (HGB A1C): Hemoglobin A1C: 8.5 % — AB (ref 4.0–5.6)

## 2022-06-26 NOTE — Telephone Encounter (Signed)
Rossy Mcjunkin Key: [id number]

## 2022-06-26 NOTE — Progress Notes (Signed)
Subjective:    Patient ID: Brenda Acosta, female    DOB: 1976/07/06, 46 y.o.   MRN: [id number]  Chief Complaint  Patient presents with   Follow-up    Patient is following up on R wrist as well. She reports she hear "clicking" from the wrist in the morning. Would like to see a specialist.   Diabetes    HPI Patient was seen today for f/u on De Quervain's tenosynovitis.  Pt states R wrist pain has become worse.  Now hearing a clicking in wrist.  Was given prednisone taper at visit 06/05/22.  Pt interested in steroid injection.  Wearing wrist brace.  Pt is R hand dominant.  Pt now on mounjaro for DM and wt.  Previously on Saxenda, but several PAs denied.  States weighted 206 lbs at home.  Notes bs now improving.  Pt just returned from a trip to [place].  Past Medical History:  Diagnosis Date   Chest pain    a. 09/2015 Ex MV: EF >55%, no ischemia/infarct.   Diabetes mellitus without complication ([place])    a. 11/20/2015 A1c 11.5;  b.  noncompliant w/ diabetes meds, glucose checks, f/u, etc.   Dyspnea on exertion    a. 09/2015 Echo: Ef 60-65%, no rwma, mild AI, mildly dil LA.   Essential hypertension    Hyperlipidemia    Morbid obesity (HCC)    Mucinous cystadenoma of ovary    Hx of    Noncompliance     Allergies  Allergen Reactions   Benzoyl Peroxide Other (See Comments)    Burns     ROS General: Denies fever, chills, night sweats +intentional changes in weight, changes in appetite HEENT: Denies headaches, ear pain, changes in vision, rhinorrhea, sore throat CV: Denies CP, palpitations, SOB, orthopnea Pulm: Denies SOB, cough, wheezing GI: Denies abdominal pain, nausea, vomiting, diarrhea, constipation GU: Denies dysuria, hematuria, frequency, vaginal discharge Msk: Denies muscle cramps, joint pains   +R wrist pain Neuro: Denies weakness, numbness, tingling Skin: Denies rashes, bruising Psych: Denies depression, anxiety, hallucinations     Objective:    Blood  pressure 96/70, pulse 86, temperature 98.5 F (36.9 C), temperature source Oral, weight 206 lb 9.6 oz (93.7 kg), SpO2 96 %.  Gen. Pleasant, well-nourished, in no distress, normal affect   HEENT: [place]/AT, face symmetric, conjunctiva clear, no scleral icterus, PERRLA, EOMI, nares patent without drainage Lungs: no accessory muscle use Cardiovascular: RRR, no peripheral edema. Musculoskeletal: Pt wearing wrist brace, edema of lateral R wrist proximal to anatomical snuff box.  Pain in R wrist with movement or R thumb.  No deformities, no cyanosis or clubbing, normal tone Neuro:  A&Ox3, CN II-XII intact, normal gait Skin:  Warm, no lesions/ rash   Wt Readings from Last 3 Encounters:  06/26/22 206 lb 9.6 oz (93.7 kg)  06/05/22 211 lb 12.8 oz (96.1 kg)  03/27/22 204 lb 6.4 oz (92.7 kg)    Lab Results  Component Value Date   WBC 7.9 05/09/2022   HGB 11.3 (L) 05/09/2022   HCT 33.8 (L) 05/09/2022   PLT 392 05/09/2022   GLUCOSE 207 (H) 05/09/2022   CHOL 213 (H) 11/15/2021   TRIG 66.0 11/15/2021   HDL 53.00 11/15/2021   LDLCALC 147 (H) 11/15/2021   ALT 16 05/09/2022   AST 18 05/09/2022   NA 137 05/09/2022   K 3.8 05/09/2022   CL 104 05/09/2022   CREATININE 0.89 05/09/2022   BUN 18 05/09/2022   CO2 26 05/09/2022  TSH 0.82 11/15/2021   HGBA1C 7.0 (H) 11/15/2021   MICROALBUR <0.7 03/16/2020    Assessment/Plan:  Tennis Must Quervain's tenosynovitis, right -some improvement with prednisone taper, then return of symptoms -continue wrist brace and supportive care such as tylenol or NSAIDs, ice, rest, etc.  - Plan: Ambulatory referral to Hand Surgery  Type 2 diabetes mellitus with other ophthalmic complication, without long-term current use of insulin (HCC)  -hgb A1 C 7.0% on 11/15/21 -saxenda d/c'd as PA denied. -continue mounjaro - Plan: POC HgB A1c  Encounter for weight management -Body mass index is 33.35 kg/m. -continue lifestyle modifications -saxenda d/c'd  as PA denied. -continue  mounjaro  F/u prn  Grier Mitts, MD

## 2022-06-28 ENCOUNTER — Other Ambulatory Visit: Payer: Self-pay | Admitting: Family Medicine

## 2022-06-28 DIAGNOSIS — M654 Radial styloid tenosynovitis [de Quervain]: Secondary | ICD-10-CM

## 2022-07-02 NOTE — Telephone Encounter (Signed)
Attempted to update patient. Left message for patient to call back.

## 2022-07-02 NOTE — Telephone Encounter (Signed)
APPROVE from 06/26/2022 through 06/25/2023.   Contacted the [facility]) she reports it runs through with her insurance but co-pay still $500.

## 2022-07-03 DIAGNOSIS — R578 Other shock: Secondary | ICD-10-CM | POA: Insufficient documentation

## 2022-07-03 DIAGNOSIS — M654 Radial styloid tenosynovitis [de Quervain]: Secondary | ICD-10-CM | POA: Diagnosis not present

## 2022-07-03 DIAGNOSIS — F411 Generalized anxiety disorder: Secondary | ICD-10-CM | POA: Diagnosis not present

## 2022-07-03 DIAGNOSIS — M25531 Pain in right wrist: Secondary | ICD-10-CM | POA: Diagnosis not present

## 2022-07-07 ENCOUNTER — Encounter: Payer: Self-pay | Admitting: Family Medicine

## 2022-07-17 DIAGNOSIS — F411 Generalized anxiety disorder: Secondary | ICD-10-CM | POA: Diagnosis not present

## 2022-07-31 DIAGNOSIS — M654 Radial styloid tenosynovitis [de Quervain]: Secondary | ICD-10-CM | POA: Diagnosis not present

## 2022-08-07 DIAGNOSIS — F411 Generalized anxiety disorder: Secondary | ICD-10-CM | POA: Diagnosis not present

## 2022-08-15 ENCOUNTER — Telehealth: Payer: BC Managed Care – PPO | Admitting: Emergency Medicine

## 2022-08-15 DIAGNOSIS — K219 Gastro-esophageal reflux disease without esophagitis: Secondary | ICD-10-CM | POA: Diagnosis not present

## 2022-08-15 MED ORDER — PANTOPRAZOLE SODIUM 20 MG PO TBEC: 20.0000 mg | DELAYED_RELEASE_TABLET | Freq: Every day | ORAL | 0 refills | Status: DC

## 2022-08-15 NOTE — Progress Notes (Signed)
E-Visit for Heartburn  We are sorry that you are not feeling well.  Here is how we plan to help!  Based on what you shared with me it looks like you most likely have Gastroesophageal Reflux Disease (GERD)  Gastroesophageal reflux disease (GERD) happens when acid from your stomach flows up into the esophagus.  When acid comes in contact with the esophagus, the acid causes sorenss (inflammation) in the esophagus.  Over time, GERD may create small holes (ulcers) in the lining of the esophagus.  I have prescribed pantaprozole 20 mg one by mouth daily. I have only give you a short-term prescription - please follow up with your primary care provider Dr. Volanda Napoleon for further care.    Your symptoms should improve in the next day or two.  You can use antacids as needed until symptoms resolve.  Call us if your heartburn worsens, you have trouble swallowing, weight loss, spitting up blood or recurrent vomiting.  Home Care: May include lifestyle changes such as weight loss, quitting smoking and alcohol consumption Avoid foods and drinks that make your symptoms worse, such as: Caffeine or alcoholic drinks Chocolate Peppermint or mint flavorings Garlic and onions Spicy foods Citrus fruits, such as oranges, lemons, or limes Tomato-based foods such as sauce, chili, salsa and pizza Fried and fatty foods Avoid lying down for 3 hours prior to your bedtime or prior to taking a nap Eat small, frequent meals instead of a large meals Wear loose-fitting clothing.  Do not wear anything tight around your waist that causes pressure on your stomach. Raise the head of your bed 6 to 8 inches with wood blocks to help you sleep.  Extra pillows will not help.  Seek Help Right Away If: You have pain in your arms, neck, jaw, teeth or back Your pain increases or changes in intensity or duration You develop nausea, vomiting or sweating (diaphoresis) You develop shortness of breath or you faint Your vomit is green, yellow,  black or looks like coffee grounds or blood Your stool is red, bloody or black  These symptoms could be signs of other problems, such as heart disease, gastric bleeding or esophageal bleeding.  Make sure you : Understand these instructions. Will watch your condition. Will get help right away if you are not doing well or get worse.  Your e-visit answers were reviewed by a board certified advanced clinical practitioner to complete your personal care plan.  Depending on the condition, your plan could have included both over the counter or prescription medications.  If there is a problem please reply  once you have received a response from your provider.  Your safety is important to [place].  If you have drug allergies check your prescription carefully.    You can use MyChart to ask questions about today's visit, request a non-urgent call back, or ask for a work or school excuse for 24 hours related to this e-Visit. If it has been greater than 24 hours you will need to follow up with your provider, or enter a new e-Visit to address those concerns.  You will get an e-mail in the next two days asking about your experience.  I hope that your e-visit has been valuable and will speed your recovery. Thank you for using e-visits.  I have spent 5 minutes in review of e-visit questionnaire, review and updating patient chart, medical decision making and response to patient.   Willeen Cass, PhD, FNP-BC

## 2022-08-27 ENCOUNTER — Other Ambulatory Visit: Payer: Self-pay | Admitting: Family Medicine

## 2022-08-27 DIAGNOSIS — I10 Essential (primary) hypertension: Secondary | ICD-10-CM

## 2022-08-28 DIAGNOSIS — M654 Radial styloid tenosynovitis [de Quervain]: Secondary | ICD-10-CM | POA: Diagnosis not present

## 2022-08-28 DIAGNOSIS — F411 Generalized anxiety disorder: Secondary | ICD-10-CM | POA: Diagnosis not present

## 2022-09-10 ENCOUNTER — Inpatient Hospital Stay: Payer: BC Managed Care – PPO | Attending: Oncology

## 2022-09-10 DIAGNOSIS — D75839 Thrombocytosis, unspecified: Secondary | ICD-10-CM | POA: Diagnosis not present

## 2022-09-10 DIAGNOSIS — Z79899 Other long term (current) drug therapy: Secondary | ICD-10-CM | POA: Diagnosis not present

## 2022-09-10 DIAGNOSIS — D509 Iron deficiency anemia, unspecified: Secondary | ICD-10-CM | POA: Insufficient documentation

## 2022-09-10 DIAGNOSIS — Z9884 Bariatric surgery status: Secondary | ICD-10-CM | POA: Insufficient documentation

## 2022-09-10 LAB — CBC WITH DIFFERENTIAL/PLATELET
Abs Immature Granulocytes: 0.02 10*3/uL (ref 0.00–0.07)
Basophils Absolute: 0.1 10*3/uL (ref 0.0–0.1)
Basophils Relative: 1 %
Eosinophils Absolute: 0.2 10*3/uL (ref 0.0–0.5)
Eosinophils Relative: 3 %
HCT: 35.8 % — ABNORMAL LOW (ref 36.0–46.0)
Hemoglobin: 12.1 g/dL (ref 12.0–15.0)
Immature Granulocytes: 0 %
Lymphocytes Relative: 32 %
Lymphs Abs: 2.7 10*3/uL (ref 0.7–4.0)
MCH: 29.4 pg (ref 26.0–34.0)
MCHC: 33.8 g/dL (ref 30.0–36.0)
MCV: 86.9 fL (ref 80.0–100.0)
Monocytes Absolute: 0.4 10*3/uL (ref 0.1–1.0)
Monocytes Relative: 4 %
Neutro Abs: 5.1 10*3/uL (ref 1.7–7.7)
Neutrophils Relative %: 60 %
Platelets: 394 10*3/uL (ref 150–400)
RBC: 4.12 MIL/uL (ref 3.87–5.11)
RDW: 12.7 % (ref 11.5–15.5)
WBC: 8.5 10*3/uL (ref 4.0–10.5)
nRBC: 0 % (ref 0.0–0.2)

## 2022-09-10 LAB — IRON AND TIBC
Iron: 98 ug/dL (ref 28–170)
Saturation Ratios: 30 % (ref 10.4–31.8)
TIBC: 330 ug/dL (ref 250–450)
UIBC: 232 ug/dL

## 2022-09-10 LAB — FERRITIN: Ferritin: 23 ng/mL (ref 11–307)

## 2022-09-10 LAB — VITAMIN B12: Vitamin B-12: 1586 pg/mL — ABNORMAL HIGH (ref 180–914)

## 2022-09-11 DIAGNOSIS — F411 Generalized anxiety disorder: Secondary | ICD-10-CM | POA: Diagnosis not present

## 2022-09-13 ENCOUNTER — Other Ambulatory Visit: Payer: BC Managed Care – PPO

## 2022-09-17 ENCOUNTER — Inpatient Hospital Stay (HOSPITAL_BASED_OUTPATIENT_CLINIC_OR_DEPARTMENT_OTHER): Payer: BC Managed Care – PPO | Admitting: Oncology

## 2022-09-17 DIAGNOSIS — Z903 Acquired absence of stomach [part of]: Secondary | ICD-10-CM | POA: Diagnosis not present

## 2022-09-17 DIAGNOSIS — D75839 Thrombocytosis, unspecified: Secondary | ICD-10-CM | POA: Diagnosis not present

## 2022-09-17 DIAGNOSIS — D649 Anemia, unspecified: Secondary | ICD-10-CM | POA: Diagnosis not present

## 2022-09-17 NOTE — Progress Notes (Signed)
HEMATOLOGY-ONCOLOGY TeleHEALTH VISIT PROGRESS NOTE  I connected with Brenda Acosta on 09/17/22  at  2:45 PM EDT by video enabled telemedicine visit and verified that I am speaking with the correct person using two identifiers. I discussed the limitations, risks, security and privacy concerns of performing an evaluation and management service by telemedicine and the availability of in-person appointments. The patient expressed understanding and agreed to proceed.   Other persons participating in the visit and their role in the encounter:  None  Patient's location: Home  Provider's location: office Chief Complaint: Thrombocytosis, iron deficiency anemia, history of gastric sleeve   INTERVAL HISTORY Brenda Acosta is a 46 y.o. female who has above history reviewed by me today presents for follow up visit for thrombocytosis, iron deficiency anemia, history of gastric sleeve She take oral iron supplementation.  No new complaints.   Past Medical History:  Diagnosis Date   Chest pain    a. 09/2015 Ex MV: EF >55%, no ischemia/infarct.   Diabetes mellitus without complication ([place])    a. 11/20/2015 A1c 11.5;  b.  noncompliant w/ diabetes meds, glucose checks, f/u, etc.   Dyspnea on exertion    a. 09/2015 Echo: Ef 60-65%, no rwma, mild AI, mildly dil LA.   Essential hypertension    Hyperlipidemia    Morbid obesity ([place])    Mucinous cystadenoma of ovary    Hx of    Noncompliance    Past Surgical History:  Procedure Laterality Date   ABDOMINAL HYSTERECTOMY     partial hyst   coloscopy     LAPAROSCOPIC GASTRIC SLEEVE RESECTION  01/08/2018   MOUTH SURGERY     OOPHORECTOMY  07/02/2006   Rt; large benign ovarian tumor    Family History  Problem Relation Age of Onset   Heart disease Mother 35       heart attack   Hypertension Father    Diabetes Father        Type II   Cancer Maternal Grandfather        prostate cancer   Colon cancer Neg Hx    Rectal cancer Neg Hx     Stomach cancer Neg Hx     Social History   Socioeconomic History   Marital status: Single    Spouse name: Not on file   Number of children: 0   Years of education: Not on file   Highest education level: Doctorate  Occupational History    Employer: [employer]  Tobacco Use   Smoking status: Never   Smokeless tobacco: Never  Vaping Use   Vaping Use: Never used  Substance and Sexual Activity   Alcohol use: No   Drug use: No   Sexual activity: Yes    Partners: Male  Other Topics Concern   Not on file  Social History Narrative   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 Avg. Glucose: 298      Regular exercise: no   Caffeine use: occassionally                  Social Determinants of Health   Financial Resource Strain: Low Risk  (03/18/2022)   Overall Financial Resource Strain (CARDIA)    Difficulty of Paying Living Expenses: Not very hard  Food Insecurity: No Food Insecurity (03/18/2022)  Hunger Vital Sign    Worried About Running Out of Food in the Last Year: Never true    Ran Out of Food in the Last Year: Never true  Transportation Needs: No Transportation Needs (03/18/2022)   PRAPARE - Hydrologist (Medical): No    Lack of Transportation (Non-Medical): No  Physical Activity: Unknown (03/18/2022)   Exercise Vital Sign    Days of Exercise per Week: 0 days    Minutes of Exercise per Session: Not on file  Stress: No Stress Concern Present (03/18/2022)   [place]    Feeling of Stress : Only a little  Social Connections: Moderately Integrated (03/18/2022)   Social Connection and Isolation Panel [NHANES]    Frequency of Communication with Friends and Family: More than three times a week    Frequency of Social Gatherings with  Friends and Family: More than three times a week    Attends Religious Services: More than 4 times per year    Active Member of [employer] or Organizations: Yes    Attends Archivist Meetings: Not on file    Marital Status: Never married  Intimate Partner Violence: Not on file    Current Outpatient Medications on File Prior to Visit  Medication Sig Dispense Refill   BAYER MICROLET LANCETS lancets Use to test blood sugar 2 time daily as instructed. 100 each 5   benzonatate (TESSALON) 100 MG capsule Take 1 capsule (100 mg total) by mouth 3 (three) times daily as needed for cough. 30 capsule 0   Blood Glucose Monitoring Suppl (BAYER CONTOUR NEXT MONITOR) w/Device KIT Use to test blood sugar daily. 1 kit 0   CONTOUR NEXT TEST test strip USE 1 STRIP TO CHECK GLUCOSE TWICE DAILY AS DIRECTED 100 each 0   fluticasone (FLONASE) 50 MCG/ACT nasal spray Place 2 sprays into both nostrils daily. 16 g 0   furosemide (LASIX) 20 MG tablet Take one tab daily prn for lower extremity edema 30 tablet 3   hydrOXYzine (ATARAX/VISTARIL) 25 MG tablet Take 1 tablet (25 mg total) by mouth at bedtime as needed for anxiety. 30 tablet 3   Insulin Pen Needle (PEN NEEDLES) 32G X 5 MM MISC For daily use. 100 each 2   Iron-Vitamin C 65-125 MG TABS Take 1 tablet by mouth daily. 30 tablet 2   lisinopril-hydrochlorothiazide (ZESTORETIC) 20-25 MG tablet Take 1 tablet by mouth once daily 90 tablet 0   meloxicam (MOBIC) 7.5 MG tablet Take 1 tablet by mouth once daily 30 tablet 0   pantoprazole (PROTONIX) 20 MG tablet Take 1 tablet (20 mg total) by mouth daily. 30 tablet 0   predniSONE (DELTASONE) 10 MG tablet Take 4 tabs every morning for 3 days, 3 tabs for 2 days, 2 tabs for 2 days, 1 tab for 1 day. 23 tablet 0   senna-docusate (SENOKOT-S) 8.6-50 MG tablet Take 1 tablet by mouth daily. 30 tablet 1   tirzepatide (MOUNJARO) 2.5 MG/0.5ML Pen Inject 2.5 mg into the skin once a week. 2 mL 0   tirzepatide (MOUNJARO) 5 MG/0.5ML Pen  Inject 5 mg into the skin once a week. 6 mL 1   No current facility-administered medications on file prior to visit.    Allergies  Allergen Reactions   Benzoyl Peroxide Other (See Comments)    Burns     Review of Systems  Constitutional:  Negative for appetite change, chills, fatigue and fever.  HENT:  Negative for hearing loss.   Cardiovascular:  Negative for chest pain.  Genitourinary:  Negative for difficulty urinating.   Musculoskeletal:  Negative for arthralgias.  Skin:  Negative for itching and rash.  Neurological:  Negative for extremity weakness.  Hematological:  Negative for adenopathy.  Psychiatric/Behavioral:  Negative for confusion.       Observations/Objective: There were no vitals filed for this visit.  There is no height or weight on file to calculate BMI.  Physical Exam Neurological:     Mental Status: She is alert.     Labs    Latest Ref Rng & Units 09/10/2022    7:59 AM 05/09/2022    8:04 AM 10/19/2021   10:11 AM  CBC  WBC 4.0 - 10.5 K/uL 8.5  7.9  7.7   Hemoglobin 12.0 - 15.0 g/dL 12.1  11.3  12.9   Hematocrit 36.0 - 46.0 % 35.8  33.8  40.0   Platelets 150 - 400 K/uL 394  392  496       Latest Ref Rng & Units 05/09/2022    8:04 AM 11/15/2021    9:35 AM 10/19/2021   10:11 AM  CMP  Glucose 70 - 99 mg/dL 207  89  93   BUN 6 - 20 mg/dL _0 Creatinine 0.44 - 1.00 mg/dL 0.89  1.02  1.03   Sodium 135 - 145 mmol/L 137  139  137   Potassium 3.5 - 5.1 mmol/L 3.8  3.9  3.8   Chloride 98 - 111 mmol/L 104  105  102   CO2 22 - 32 mmol/L _1 Calcium 8.9 - 10.3 mg/dL 8.8  9.7  8.9   Total Protein 6.5 - 8.1 g/dL 7.0   7.8   Total Bilirubin 0.3 - 1.2 mg/dL 0.7   0.2   Alkaline Phos 38 - 126 U/L 50   36   AST 15 - 41 U/L 18   27   ALT 0 - 44 U/L 16   28        Assessment and Plan: 1. Normocytic anemia   2. Thrombocytosis   3. H/O gastric sleeve     Thrombocytosis, resolved. JAK2 [id number] mutation negative, with reflex to other  mutations CALR, MPL, JAK 2 Ex 12-15 mutations negative. BCR ABL negative.  Less likely primary myeloproliferative disorders Labs reviewed and discussed with patient Platelet count has normalized.  #Anemia, hemoglobin has normalized. Labs reviewed and discussed with patient.  Iron panel has been stable. Continue oral iron supplementation for maintenance.  History of gastric sleeve resection.  Will monitor iron panel, B12, folate periodically.  Follow Up Instructions: 6 months   I discussed the assessment and treatment plan with the patient. The patient was provided an opportunity to ask questions and all were answered. The patient agreed with the plan and demonstrated an understanding of the instructions. The patient was advised to call back or seek an in-person evaluation if the symptoms worsen or if the condition fails to improve as anticipated.   Total face to face encounter time for this patient visit was 15 min. >50% of the time was spent in counseling and coordination of care.    Earlie Server, MD 09/17/2022 9:10 PM

## 2022-09-24 ENCOUNTER — Encounter: Payer: Self-pay | Admitting: Family Medicine

## 2022-09-25 DIAGNOSIS — F411 Generalized anxiety disorder: Secondary | ICD-10-CM | POA: Diagnosis not present

## 2022-10-09 DIAGNOSIS — F411 Generalized anxiety disorder: Secondary | ICD-10-CM | POA: Diagnosis not present

## 2022-10-10 DIAGNOSIS — L700 Acne vulgaris: Secondary | ICD-10-CM | POA: Diagnosis not present

## 2022-10-23 DIAGNOSIS — F411 Generalized anxiety disorder: Secondary | ICD-10-CM | POA: Diagnosis not present

## 2022-11-06 DIAGNOSIS — F411 Generalized anxiety disorder: Secondary | ICD-10-CM | POA: Diagnosis not present

## 2022-11-13 ENCOUNTER — Other Ambulatory Visit: Payer: Self-pay | Admitting: Family Medicine

## 2022-11-13 DIAGNOSIS — E1165 Type 2 diabetes mellitus with hyperglycemia: Secondary | ICD-10-CM

## 2022-11-13 MED ORDER — TIRZEPATIDE 7.5 MG/0.5ML ~~LOC~~ SOAJ: 7.5000 mg | SUBCUTANEOUS | 1 refills | Status: DC

## 2022-11-20 DIAGNOSIS — F411 Generalized anxiety disorder: Secondary | ICD-10-CM | POA: Diagnosis not present

## 2022-11-21 DIAGNOSIS — N921 Excessive and frequent menstruation with irregular cycle: Secondary | ICD-10-CM | POA: Diagnosis not present

## 2022-11-21 DIAGNOSIS — Z01419 Encounter for gynecological examination (general) (routine) without abnormal findings: Secondary | ICD-10-CM | POA: Diagnosis not present

## 2022-11-21 DIAGNOSIS — Z124 Encounter for screening for malignant neoplasm of cervix: Secondary | ICD-10-CM | POA: Diagnosis not present

## 2022-11-21 DIAGNOSIS — Z1231 Encounter for screening mammogram for malignant neoplasm of breast: Secondary | ICD-10-CM | POA: Diagnosis not present

## 2022-12-03 ENCOUNTER — Other Ambulatory Visit: Payer: Self-pay | Admitting: Family Medicine

## 2022-12-03 ENCOUNTER — Encounter: Payer: Self-pay | Admitting: Family Medicine

## 2022-12-03 DIAGNOSIS — I10 Essential (primary) hypertension: Secondary | ICD-10-CM

## 2022-12-04 DIAGNOSIS — F411 Generalized anxiety disorder: Secondary | ICD-10-CM | POA: Diagnosis not present

## 2022-12-18 DIAGNOSIS — F411 Generalized anxiety disorder: Secondary | ICD-10-CM | POA: Diagnosis not present

## 2022-12-25 ENCOUNTER — Other Ambulatory Visit: Payer: Self-pay | Admitting: Family Medicine

## 2022-12-25 DIAGNOSIS — K219 Gastro-esophageal reflux disease without esophagitis: Secondary | ICD-10-CM

## 2022-12-25 MED ORDER — PANTOPRAZOLE SODIUM 20 MG PO TBEC: 20.0000 mg | DELAYED_RELEASE_TABLET | Freq: Every day | ORAL | 0 refills | Status: DC

## 2022-12-31 DIAGNOSIS — F411 Generalized anxiety disorder: Secondary | ICD-10-CM | POA: Diagnosis not present

## 2023-01-28 DIAGNOSIS — F411 Generalized anxiety disorder: Secondary | ICD-10-CM | POA: Diagnosis not present

## 2023-01-31 ENCOUNTER — Other Ambulatory Visit: Payer: Self-pay | Admitting: Obstetrics and Gynecology

## 2023-01-31 DIAGNOSIS — N840 Polyp of corpus uteri: Secondary | ICD-10-CM | POA: Diagnosis not present

## 2023-01-31 DIAGNOSIS — N921 Excessive and frequent menstruation with irregular cycle: Secondary | ICD-10-CM | POA: Diagnosis not present

## 2023-02-03 ENCOUNTER — Encounter: Payer: Self-pay | Admitting: Family Medicine

## 2023-02-10 ENCOUNTER — Telehealth: Payer: BC Managed Care – PPO | Admitting: Nurse Practitioner

## 2023-02-10 DIAGNOSIS — J069 Acute upper respiratory infection, unspecified: Secondary | ICD-10-CM | POA: Diagnosis not present

## 2023-02-10 DIAGNOSIS — R051 Acute cough: Secondary | ICD-10-CM | POA: Diagnosis not present

## 2023-02-10 MED ORDER — BENZONATATE 100 MG PO CAPS: 100.0000 mg | ORAL_CAPSULE | Freq: Three times a day (TID) | ORAL | 0 refills | Status: DC | PRN

## 2023-02-10 MED ORDER — IPRATROPIUM BROMIDE 0.03 % NA SOLN: 2.0000 | Freq: Two times a day (BID) | NASAL | 12 refills | Status: AC

## 2023-02-10 NOTE — Progress Notes (Signed)
E-Visit for Upper Respiratory Infection   We are sorry you are not feeling well.  Here is how we plan to help!  Based on what you have shared with me, it looks like you may have a viral upper respiratory infection.  Upper respiratory infections are caused by a large number of viruses; however, rhinovirus is the most common cause.   Symptoms vary from person to person, with common symptoms including sore throat, cough, fatigue or lack of energy and feeling of general discomfort.  A low-grade fever of up to 100.4 may present, but is often uncommon.  Symptoms vary however, and are closely related to a person's age or underlying illnesses.  The most common symptoms associated with an upper respiratory infection are nasal discharge or congestion, cough, sneezing, headache and pressure in the ears and face.  These symptoms usually persist for about 3 to 10 days, but can last up to 2 weeks.  It is important to know that upper respiratory infections do not cause serious illness or complications in most cases.    Upper respiratory infections can be transmitted from person to person, with the most common method of transmission being a person's hands.  The virus is able to live on the skin and can infect other persons for up to 2 hours after direct contact.  Also, these can be transmitted when someone coughs or sneezes; thus, it is important to cover the mouth to reduce this risk.  To keep the spread of the illness at bay, good hand hygiene is very important.  This is an infection that is most likely caused by a virus. There are no specific treatments other than to help you with the symptoms until the infection runs its course.  We are sorry you are not feeling well.  Here is how we plan to help!   For nasal congestion, you may use an oral decongestants such as Mucinex D or if you have glaucoma or high blood pressure use plain Mucinex.  Saline nasal spray or nasal drops can help and can safely be used as often as  needed for congestion.  For your congestion, I have prescribed Azelastine nasal spray two sprays in each nostril twice a day  If you do not have a history of heart disease, hypertension, diabetes or thyroid disease, prostate/bladder issues or glaucoma, you may also use Sudafed to treat nasal congestion.  It is highly recommended that you consult with a pharmacist or your primary care physician to ensure this medication is safe for you to take.     If you have a cough, you may use cough suppressants such as Delsym and Robitussin.  If you have glaucoma or high blood pressure, you can also use Coricidin HBP.   For cough I have prescribed for you A prescription cough medication called Tessalon Perles 100 mg. You may take 1-2 capsules every 8 hours as needed for cough  If you have a sore or scratchy throat, use a saltwater gargle-  to  teaspoon of salt dissolved in a 4-ounce to 8-ounce glass of warm water.  Gargle the solution for approximately 15-30 seconds and then spit.  It is important not to swallow the solution.  You can also use throat lozenges/cough drops and Chloraseptic spray to help with throat pain or discomfort.  Warm or cold liquids can also be helpful in relieving throat pain.  For headache, pain or general discomfort, you can use Ibuprofen or Tylenol as directed.   Some authorities believe   that zinc sprays or the use of Echinacea may shorten the course of your symptoms.   HOME CARE Only take medications as instructed by your medical team. Be sure to drink plenty of fluids. Water is fine as well as fruit juices, sodas and electrolyte beverages. You may want to stay away from caffeine or alcohol. If you are nauseated, try taking small sips of liquids. How do you know if you are getting enough fluid? Your urine should be a pale yellow or almost colorless. Get rest. Taking a steamy shower or using a humidifier may help nasal congestion and ease sore throat pain. You can place a towel over  your head and breathe in the steam from hot water coming from a faucet. Using a saline nasal spray works much the same way. Cough drops, hard candies and sore throat lozenges may ease your cough. Avoid close contacts especially the very young and the elderly Cover your mouth if you cough or sneeze Always remember to wash your hands.   GET HELP RIGHT AWAY IF: You develop worsening fever. If your symptoms do not improve within 10 days You develop yellow or green discharge from your nose over 3 days. You have coughing fits You develop a severe head ache or visual changes. You develop shortness of breath, difficulty breathing or start having chest pain Your symptoms persist after you have completed your treatment plan  MAKE SURE YOU  Understand these instructions. Will watch your condition. Will get help right away if you are not doing well or get worse.  Thank you for choosing an e-visit.  Your e-visit answers were reviewed by a board certified advanced clinical practitioner to complete your personal care plan. Depending upon the condition, your plan could have included both over the counter or prescription medications.  Please review your pharmacy choice. Make sure the pharmacy is open so you can pick up prescription now. If there is a problem, you may contact your provider through [employer] and have the prescription routed to another pharmacy.  Your safety is important to [place]. If you have drug allergies check your prescription carefully.   For the next 24 hours you can use MyChart to ask questions about today's visit, request a non-urgent call back, or ask for a work or school excuse. You will get an email in the next two days asking about your experience. I hope that your e-visit has been valuable and will speed your recovery.  Meds ordered this encounter  Medications   ipratropium (ATROVENT) 0.03 % nasal spray    Sig: Place 2 sprays into both nostrils every 12 (twelve) hours.     Dispense:  30 mL    Refill:  12   benzonatate (TESSALON) 100 MG capsule    Sig: Take 1 capsule (100 mg total) by mouth 3 (three) times daily as needed.    Dispense:  30 capsule    Refill:  0     I spent approximately 5 minutes reviewing the patient's history, current symptoms and coordinating their care today.

## 2023-02-12 DIAGNOSIS — F411 Generalized anxiety disorder: Secondary | ICD-10-CM | POA: Diagnosis not present

## 2023-02-19 ENCOUNTER — Telehealth: Payer: BC Managed Care – PPO | Admitting: Physician Assistant

## 2023-02-19 DIAGNOSIS — J020 Streptococcal pharyngitis: Secondary | ICD-10-CM

## 2023-02-19 MED ORDER — AMOXICILLIN 500 MG PO CAPS: 500.0000 mg | ORAL_CAPSULE | Freq: Two times a day (BID) | ORAL | 0 refills | Status: AC

## 2023-02-19 NOTE — Progress Notes (Signed)

## 2023-02-20 ENCOUNTER — Telehealth: Payer: BC Managed Care – PPO | Admitting: Family Medicine

## 2023-02-20 DIAGNOSIS — J069 Acute upper respiratory infection, unspecified: Secondary | ICD-10-CM | POA: Diagnosis not present

## 2023-02-20 DIAGNOSIS — H6991 Unspecified Eustachian tube disorder, right ear: Secondary | ICD-10-CM

## 2023-02-20 MED ORDER — FLUTICASONE PROPIONATE 50 MCG/ACT NA SUSP: 2.0000 | Freq: Every day | NASAL | 0 refills | Status: AC

## 2023-02-20 NOTE — Progress Notes (Signed)
E-Visit for Ear Pain - Eustachian Tube Dysfunction   We are sorry that you are not feeling well. Here is how we plan to help!  Based on what you have shared with me it looks like you have Eustachian Tube Dysfunction.  Eustachian Tube Dysfunction is a condition where the tubes that connect your middle ears to your upper throat become blocked. This can lead to discomfort, hearing difficulties and a feeling of fullness in your ear. Eustachian tube dysfunction usually resolves itself in a few days. The usual symptoms include: Hearing problems Tinnitus, or ringing in your ears Clicking or popping sounds A feeling of fullness in your ears Pain that mimics an ear infection Dizziness, vertigo or balance problems A "tickling" sensation in your ears  ?Eustachian tube dysfunction symptoms may get worse in higher altitudes. This is called barotrauma, and it can happen while scuba diving, flying in an airplane or driving in the mountains.   What causes eustachian tube dysfunction? Allergies and infections (like the common cold and the flu) are the most common causes of eustachian tube dysfunction. These conditions can cause inflammation and mucus buildup, leading to blockage. GERD, or chronic acid reflux, can also cause ETD. This is because stomach acid can back up into your throat and result in inflammation. As mentioned above, altitude changes can also cause ETD.   What are some common eustachian tube dysfunction treatments? In most cases, treatment isn't necessary because ETD often resolves on its own. However, you might need treatment if your symptoms linger for more than two weeks.    Eustachian tube dysfunction treatment depends on the cause and the severity of your condition. Treatments may include home remedies, medications or, in severe cases, surgery.     HOME CARE: Sometimes simple home remedies can help with mild cases of eustachian tube dysfunction. To try and clear the blockage, you  can: Chew gum. Yawn. Swallow. Try the Valsalva maneuver (breathing out forcefully while closing your mouth and pinching your nostrils). Use a saline spray to clear out nasal passages.  MEDICATIONS: Over-the-counter medications can help if allergies are causing eustachian tube dysfunction. Try antihistamines (like cetirizine or diphenhydramine) to ease your symptoms. If you have discomfort, pain relievers -- such as acetaminophen or ibuprofen -- can help.  Sometimes intranasal glucocorticosteroids (like Flonase or Nasacort) help.  I have prescribed Fluticasone 50 mcg/spray 2 sprays in each nostril daily for 10-14 days  Please continue antibiotics that were prescribed yesterday as this can take a few days to get better.  I have sent you Flonase to help with your symptoms.  I recommend following up in person tomorrow with you PCP or urgent care to assess your ear and your current symptoms.   GET HELP RIGHT AWAY IF: Fever is over 102.2 degrees. You develop progressive ear pain or hearing loss. Ear symptoms persist longer than 3 days after treatment.  MAKE SURE YOU: Understand these instructions. Will watch your condition. Will get help right away if you are not doing well or get worse.  Thank you for choosing an e-visit.  Your e-visit answers were reviewed by a board certified advanced clinical practitioner to complete your personal care plan. Depending upon the condition, your plan could have included both over the counter or prescription medications.  Please review your pharmacy choice. Make sure the pharmacy is open so you can pick up the prescription now. If there is a problem, you may contact your provider through [facility] messaging and have the prescription routed to  another pharmacy.  Your safety is important to [place]. If you have drug allergies check your prescription carefully.   For the next 24 hours you can use MyChart to ask questions about today's visit, request a non-urgent call  back, or ask for a work or school excuse. You will get an email with a survey after your eVisit asking about your experience. We would appreciate your feedback. I hope that your e-visit has been valuable and will aid in your recovery.  I have spent 5 minutes in review of e-visit questionnaire, review and updating patient chart, medical decision making and response to patient.   Chanda Busing, PA-C

## 2023-02-25 ENCOUNTER — Encounter: Payer: Self-pay | Admitting: Family Medicine

## 2023-02-26 DIAGNOSIS — F411 Generalized anxiety disorder: Secondary | ICD-10-CM | POA: Diagnosis not present

## 2023-03-01 ENCOUNTER — Other Ambulatory Visit: Payer: Self-pay | Admitting: Family Medicine

## 2023-03-01 DIAGNOSIS — I10 Essential (primary) hypertension: Secondary | ICD-10-CM

## 2023-03-02 ENCOUNTER — Encounter: Payer: Self-pay | Admitting: Family Medicine

## 2023-03-04 ENCOUNTER — Other Ambulatory Visit: Payer: Self-pay | Admitting: Family Medicine

## 2023-03-04 DIAGNOSIS — E1165 Type 2 diabetes mellitus with hyperglycemia: Secondary | ICD-10-CM

## 2023-03-10 ENCOUNTER — Other Ambulatory Visit: Payer: Self-pay | Admitting: Nurse Practitioner

## 2023-03-10 DIAGNOSIS — R051 Acute cough: Secondary | ICD-10-CM

## 2023-03-10 MED ORDER — BENZONATATE 100 MG PO CAPS: 100.0000 mg | ORAL_CAPSULE | Freq: Three times a day (TID) | ORAL | 0 refills | Status: DC | PRN

## 2023-03-12 DIAGNOSIS — F411 Generalized anxiety disorder: Secondary | ICD-10-CM | POA: Diagnosis not present

## 2023-03-17 ENCOUNTER — Inpatient Hospital Stay: Payer: BC Managed Care – PPO | Attending: Oncology

## 2023-03-17 DIAGNOSIS — D75839 Thrombocytosis, unspecified: Secondary | ICD-10-CM | POA: Insufficient documentation

## 2023-03-17 DIAGNOSIS — Z79899 Other long term (current) drug therapy: Secondary | ICD-10-CM | POA: Diagnosis not present

## 2023-03-17 DIAGNOSIS — D509 Iron deficiency anemia, unspecified: Secondary | ICD-10-CM | POA: Insufficient documentation

## 2023-03-17 DIAGNOSIS — Z9884 Bariatric surgery status: Secondary | ICD-10-CM | POA: Diagnosis not present

## 2023-03-17 LAB — CBC WITH DIFFERENTIAL/PLATELET
Abs Immature Granulocytes: 0.03 10*3/uL (ref 0.00–0.07)
Basophils Absolute: 0.1 10*3/uL (ref 0.0–0.1)
Basophils Relative: 1 %
Eosinophils Absolute: 0.4 10*3/uL (ref 0.0–0.5)
Eosinophils Relative: 4 %
HCT: 32.5 % — ABNORMAL LOW (ref 36.0–46.0)
Hemoglobin: 10.8 g/dL — ABNORMAL LOW (ref 12.0–15.0)
Immature Granulocytes: 0 %
Lymphocytes Relative: 36 %
Lymphs Abs: 3.6 10*3/uL (ref 0.7–4.0)
MCH: 29.8 pg (ref 26.0–34.0)
MCHC: 33.2 g/dL (ref 30.0–36.0)
MCV: 89.5 fL (ref 80.0–100.0)
Monocytes Absolute: 0.5 10*3/uL (ref 0.1–1.0)
Monocytes Relative: 5 %
Neutro Abs: 5.5 10*3/uL (ref 1.7–7.7)
Neutrophils Relative %: 54 %
Platelets: 362 10*3/uL (ref 150–400)
RBC: 3.63 MIL/uL — ABNORMAL LOW (ref 3.87–5.11)
RDW: 12.4 % (ref 11.5–15.5)
WBC: 10.1 10*3/uL (ref 4.0–10.5)
nRBC: 0 % (ref 0.0–0.2)

## 2023-03-17 LAB — IRON AND TIBC
Iron: 85 ug/dL (ref 28–170)
Saturation Ratios: 26 % (ref 10.4–31.8)
TIBC: 328 ug/dL (ref 250–450)
UIBC: 243 ug/dL

## 2023-03-17 LAB — FERRITIN: Ferritin: 33 ng/mL (ref 11–307)

## 2023-03-17 LAB — VITAMIN B12: Vitamin B-12: 533 pg/mL (ref 180–914)

## 2023-03-19 ENCOUNTER — Encounter: Payer: Self-pay | Admitting: Family Medicine

## 2023-03-19 ENCOUNTER — Encounter: Payer: Self-pay | Admitting: Oncology

## 2023-03-19 ENCOUNTER — Inpatient Hospital Stay (HOSPITAL_BASED_OUTPATIENT_CLINIC_OR_DEPARTMENT_OTHER): Payer: BC Managed Care – PPO | Admitting: Oncology

## 2023-03-19 DIAGNOSIS — D75839 Thrombocytosis, unspecified: Secondary | ICD-10-CM | POA: Diagnosis not present

## 2023-03-19 DIAGNOSIS — Z20822 Contact with and (suspected) exposure to covid-19: Secondary | ICD-10-CM | POA: Diagnosis not present

## 2023-03-19 DIAGNOSIS — D649 Anemia, unspecified: Secondary | ICD-10-CM | POA: Insufficient documentation

## 2023-03-19 DIAGNOSIS — Z903 Acquired absence of stomach [part of]: Secondary | ICD-10-CM

## 2023-03-19 NOTE — Assessment & Plan Note (Signed)
Anemia, hemoglobin decreased Labs reviewed and discussed with patient.  Iron panel has been stable. B12 level is within normal limit  Continue oral iron supplementation. Start sublingual B12 1000 mcg 2-3 time per week.  Repeat labs in 3 months to see if any improvement of Hb Follow up in 6 months.

## 2023-03-19 NOTE — Progress Notes (Addendum)
HEMATOLOGY-ONCOLOGY TeleHEALTH VISIT PROGRESS NOTE  I connected with Brenda Acosta on 03/19/23  at  1:45 PM EDT by video enabled telemedicine visit and verified that I am speaking with the correct person using two identifiers. I discussed the limitations, risks, security and privacy concerns of performing an evaluation and management service by telemedicine and the availability of in-person appointments. The patient expressed understanding and agreed to proceed.   Other persons participating in the visit and their role in the encounter:  None  Patient's location: Home  Provider's location: office Chief Complaint: Thrombocytosis, iron deficiency anemia, history of gastric sleeve   INTERVAL HISTORY Brenda Acosta is a 47 y.o. female who has above history reviewed by me today presents for follow up visit for thrombocytosis, iron deficiency anemia, history of gastric sleeve She take oral iron supplementation. + fatigue, attributes to recent busy work schedule.  No new complaints.   Past Medical History:  Diagnosis Date   Chest pain    a. 09/2015 Ex MV: EF >55%, no ischemia/infarct.   Diabetes mellitus without complication    a. 11/20/2015 A1c 11.5;  b.  noncompliant w/ diabetes meds, glucose checks, f/u, etc.   Dyspnea on exertion    a. 09/2015 Echo: Ef 60-65%, no rwma, mild AI, mildly dil LA.   Essential hypertension    Hyperlipidemia    Morbid obesity    Mucinous cystadenoma of ovary    Hx of    Noncompliance    Past Surgical History:  Procedure Laterality Date   ABDOMINAL HYSTERECTOMY     partial hyst   coloscopy     LAPAROSCOPIC GASTRIC SLEEVE RESECTION  01/08/2018   MOUTH SURGERY     OOPHORECTOMY  07/02/2006   Rt; large benign ovarian tumor    Family History  Problem Relation Age of Onset   Heart disease Mother 47       heart attack   Hypertension Father    Diabetes Father        Type II   Cancer Maternal Grandfather        prostate cancer   Colon cancer  Neg Hx    Rectal cancer Neg Hx    Stomach cancer Neg Hx     Social History   Socioeconomic History   Marital status: Single    Spouse name: Not on file   Number of children: 0   Years of education: Not on file   Highest education level: Doctorate  Occupational History    Employer: [employer]  Tobacco Use   Smoking status: Never   Smokeless tobacco: Never  Vaping Use   Vaping Use: Never used  Substance and Sexual Activity   Alcohol use: No   Drug use: No   Sexual activity: Yes    Partners: Male  Other Topics Concern   Not on file  Social History Narrative   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 Avg. Glucose: 298      Regular exercise: no   Caffeine use: occassionally                  Social Determinants of Health   Financial Resource Strain: Low Risk  (03/18/2022)   Overall Financial Resource Strain (CARDIA)    Difficulty of Paying Living Expenses: Not very hard  Food  Insecurity: No Food Insecurity (03/18/2022)   Hunger Vital Sign    Worried About Running Out of Food in the Last Year: Never true    Ran Out of Food in the Last Year: Never true  Transportation Needs: No Transportation Needs (03/18/2022)   PRAPARE - Administrator, Civil Service (Medical): No    Lack of Transportation (Non-Medical): No  Physical Activity: Unknown (03/18/2022)   Exercise Vital Sign    Days of Exercise per Week: 0 days    Minutes of Exercise per Session: Not on file  Stress: No Stress Concern Present (03/18/2022)   [employer] of Occupational Health - Occupational Stress Questionnaire    Feeling of Stress : Only a little  Social Connections: Moderately Integrated (03/18/2022)   Social Connection and Isolation Panel [NHANES]    Frequency of Communication with Friends and Family: More than three times a week     Frequency of Social Gatherings with Friends and Family: More than three times a week    Attends Religious Services: More than 4 times per year    Active Member of [employer] or Organizations: Yes    Attends Banker Meetings: Not on file    Marital Status: Never married  Intimate Partner Violence: Not on file    Current Outpatient Medications on File Prior to Visit  Medication Sig Dispense Refill   BAYER MICROLET LANCETS lancets Use to test blood sugar 2 time daily as instructed. 100 each 5   benzonatate (TESSALON) 100 MG capsule Take 1 capsule (100 mg total) by mouth 3 (three) times daily as needed. 21 capsule 0   Blood Glucose Monitoring Suppl (BAYER CONTOUR NEXT MONITOR) w/Device KIT Use to test blood sugar daily. 1 kit 0   CONTOUR NEXT TEST test strip USE 1 STRIP TO CHECK GLUCOSE TWICE DAILY AS DIRECTED 100 each 0   fluticasone (FLONASE) 50 MCG/ACT nasal spray Place 2 sprays into both nostrils daily. 16 g 0   furosemide (LASIX) 20 MG tablet Take one tab daily prn for lower extremity edema 30 tablet 3   hydrOXYzine (ATARAX/VISTARIL) 25 MG tablet Take 1 tablet (25 mg total) by mouth at bedtime as needed for anxiety. 30 tablet 3   Insulin Pen Needle (PEN NEEDLES) 32G X 5 MM MISC For daily use. 100 each 2   ipratropium (ATROVENT) 0.03 % nasal spray Place 2 sprays into both nostrils every 12 (twelve) hours. 30 mL 12   Iron-Vitamin C 65-125 MG TABS Take 1 tablet by mouth daily. 30 tablet 2   lisinopril-hydrochlorothiazide (ZESTORETIC) 20-25 MG tablet Take 1 tablet by mouth once daily 90 tablet 0   meloxicam (MOBIC) 7.5 MG tablet Take 1 tablet by mouth once daily 30 tablet 0   MOUNJARO 7.5 MG/0.5ML Pen INJECT 7.5 MG INTO THE SKIN ONCE A WEEK 12 mL 0   pantoprazole (PROTONIX) 20 MG tablet Take 1 tablet (20 mg total) by mouth daily. 30 tablet 0   predniSONE (DELTASONE) 10 MG tablet Take 4 tabs every morning for 3 days, 3 tabs for 2 days, 2 tabs for 2 days, 1 tab for 1 day. 23 tablet 0    senna-docusate (SENOKOT-S) 8.6-50 MG tablet Take 1 tablet by mouth daily. 30 tablet 1   No current facility-administered medications on file prior to visit.    Allergies  Allergen Reactions   Benzoyl Peroxide Other (See Comments)    Burns     Review of Systems  Constitutional:  Positive for fatigue. Negative  for appetite change, chills and fever.  HENT:   Negative for hearing loss.   Cardiovascular:  Negative for chest pain.  Genitourinary:  Negative for difficulty urinating.   Musculoskeletal:  Negative for arthralgias.  Skin:  Negative for itching and rash.  Neurological:  Negative for extremity weakness.  Hematological:  Negative for adenopathy.  Psychiatric/Behavioral:  Negative for confusion.       Observations/Objective: There were no vitals filed for this visit.  There is no height or weight on file to calculate BMI.  Physical Exam Neurological:     Mental Status: She is alert.     Labs    Latest Ref Rng & Units 03/17/2023    1:44 PM 09/10/2022    7:59 AM 05/09/2022    8:04 AM  CBC  WBC 4.0 - 10.5 K/uL 10.1  8.5  7.9   Hemoglobin 12.0 - 15.0 g/dL 16.1  09.6  04.5   Hematocrit 36.0 - 46.0 % 32.5  35.8  33.8   Platelets 150 - 400 K/uL 362  394  392       Latest Ref Rng & Units 05/09/2022    8:04 AM 11/15/2021    9:35 AM 10/19/2021   10:11 AM  CMP  Glucose 70 - 99 mg/dL 409  89  93   BUN 6 - 20 mg/dL Creatinine 0.44 - 1.00 mg/dL 8.11  9.14  7.82   Sodium 135 - 145 mmol/L 137  139  137   Potassium 3.5 - 5.1 mmol/L 3.8  3.9  3.8   Chloride 98 - 111 mmol/L 104  105  102   CO2 22 - 32 mmol/L Calcium 8.9 - 10.3 mg/dL 8.8  9.7  8.9   Total Protein 6.5 - 8.1 g/dL 7.0   7.8   Total Bilirubin 0.3 - 1.2 mg/dL 0.7   0.2   Alkaline Phos 38 - 126 U/L 50   36   AST 15 - 41 U/L 18   27   ALT 0 - 44 U/L 16   28      ASSESSMENT & PLAN:   Normocytic anemia Anemia, hemoglobin decreased Labs reviewed and discussed with patient.  Iron panel has  been stable. B12 level is within normal limit  Continue oral iron supplementation. Start sublingual B12 1000 mcg 2-3 time per week.  Repeat labs in 3 months to see if any improvement of Hb Follow up in 6 months.   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  Will monitor iron panel, B12, folate periodically.  Orders Placed This Encounter  Procedures   CBC with Differential (Cancer Center Only)    Standing Status:   Future    Standing Expiration Date:   03/18/2024   Iron and TIBC    Standing Status:   Future    Standing Expiration Date:   03/18/2024   Ferritin    Standing Status:   Future    Standing Expiration Date:   03/18/2024   Retic Panel    Standing Status:   Future    Standing Expiration Date:   03/18/2024   Vitamin B12    Standing Status:   Future    Standing Expiration Date:   03/18/2024   CBC with Differential (Cancer Center Only)    Standing Status:   Future    Standing Expiration Date:   03/18/2024   CMP (Cancer Center only)  Standing Status:   Future    Standing Expiration Date:   03/18/2024   Iron and TIBC    Standing Status:   Future    Standing Expiration Date:   03/18/2024   Ferritin    Standing Status:   Future    Standing Expiration Date:   03/18/2024   Vitamin B12    Standing Status:   Future    Standing Expiration Date:   03/18/2024   Folate    Standing Status:   Future    Standing Expiration Date:   03/18/2024   Retic Panel    Standing Status:   Future    Standing Expiration Date:   03/18/2024   Zinc    Standing Status:   Future    Standing Expiration Date:   03/18/2024   I provided 15 minutes of Virtual visit time during this encounter which included chart review, counseling, and coordination of care as documented above. Patient understands the limitation of virtual visits. All questions were answered. The patient knows to call the clinic with any problems, questions or concerns. She prefers virtual MD visits for future  appointments.    Rickard Patience, MD, PhD [facility] Hematology Oncology 03/19/2023

## 2023-03-19 NOTE — Assessment & Plan Note (Signed)
History of gastric sleeve resection.  Will monitor iron panel, B12, folate periodically.

## 2023-03-19 NOTE — Progress Notes (Signed)
Pt contacted for Mychart visit. No new concerns voiced.  

## 2023-03-20 ENCOUNTER — Encounter: Payer: Self-pay | Admitting: Family Medicine

## 2023-03-20 ENCOUNTER — Ambulatory Visit (INDEPENDENT_AMBULATORY_CARE_PROVIDER_SITE_OTHER): Payer: BC Managed Care – PPO | Admitting: Family Medicine

## 2023-03-20 ENCOUNTER — Other Ambulatory Visit (HOSPITAL_COMMUNITY): Payer: Self-pay

## 2023-03-20 VITALS — BP 112/72 | Temp 98.7°F | Ht 66.0 in | Wt 219.4 lb

## 2023-03-20 DIAGNOSIS — I10 Essential (primary) hypertension: Secondary | ICD-10-CM

## 2023-03-20 DIAGNOSIS — R600 Localized edema: Secondary | ICD-10-CM | POA: Diagnosis not present

## 2023-03-20 DIAGNOSIS — Z7985 Long-term (current) use of injectable non-insulin antidiabetic drugs: Secondary | ICD-10-CM

## 2023-03-20 DIAGNOSIS — E782 Mixed hyperlipidemia: Secondary | ICD-10-CM | POA: Diagnosis not present

## 2023-03-20 DIAGNOSIS — Z0000 Encounter for general adult medical examination without abnormal findings: Secondary | ICD-10-CM | POA: Diagnosis not present

## 2023-03-20 DIAGNOSIS — E1165 Type 2 diabetes mellitus with hyperglycemia: Secondary | ICD-10-CM | POA: Diagnosis not present

## 2023-03-20 DIAGNOSIS — Z6835 Body mass index (BMI) 35.0-35.9, adult: Secondary | ICD-10-CM | POA: Diagnosis not present

## 2023-03-20 LAB — LIPID PANEL
Cholesterol: 207 mg/dL — ABNORMAL HIGH (ref 0–200)
HDL: 47.6 mg/dL (ref >39.00)
LDL Cholesterol: 141 mg/dL — ABNORMAL HIGH (ref 0–99)
NonHDL: 159.89
Total CHOL/HDL Ratio: 4
Triglycerides: 94 mg/dL (ref 0.0–149.0)
VLDL: 18.8 mg/dL (ref 0.0–40.0)

## 2023-03-20 LAB — COMPREHENSIVE METABOLIC PANEL
ALT: 20 U/L (ref 0–35)
AST: 20 U/L (ref 0–37)
Albumin: 3.9 g/dL (ref 3.5–5.2)
Alkaline Phosphatase: 52 U/L (ref 39–117)
BUN: 23 mg/dL (ref 6–23)
CO2: 25 mEq/L (ref 19–32)
Calcium: 9.3 mg/dL (ref 8.4–10.5)
Chloride: 103 mEq/L (ref 96–112)
Creatinine, Ser: 1.03 mg/dL (ref 0.40–1.20)
GFR: 65.04 mL/min (ref >60.00)
Glucose, Bld: 154 mg/dL — ABNORMAL HIGH (ref 70–99)
Potassium: 3.8 mEq/L (ref 3.5–5.1)
Sodium: 136 mEq/L (ref 135–145)
Total Bilirubin: 0.5 mg/dL (ref 0.2–1.2)
Total Protein: 7.3 g/dL (ref 6.0–8.3)

## 2023-03-20 LAB — T4, FREE: Free T4: 0.96 ng/dL (ref 0.60–1.60)

## 2023-03-20 LAB — TSH: TSH: 0.98 u[IU]/mL (ref 0.35–5.50)

## 2023-03-20 LAB — VITAMIN D 25 HYDROXY (VIT D DEFICIENCY, FRACTURES): VITD: 38.77 ng/mL (ref 30.00–100.00)

## 2023-03-20 LAB — HEMOGLOBIN A1C: Hgb A1c MFr Bld: 7.1 % — ABNORMAL HIGH (ref 4.6–6.5)

## 2023-03-20 MED ORDER — FUROSEMIDE 20 MG PO TABS
20.0000 mg | ORAL_TABLET | Freq: Every day | ORAL | 1 refills | Status: AC | PRN
Start: 2023-03-20 — End: ?
  Filled 2023-03-20 – 2023-04-03 (×2): qty 90, 90d supply, fill #0
  Filled 2023-05-16: qty 90, 90d supply, fill #1

## 2023-03-20 MED ORDER — BLOOD GLUCOSE TEST VI STRP
1.0000 | ORAL_STRIP | Freq: Three times a day (TID) | 11 refills | Status: DC
Start: 2023-03-20 — End: 2024-06-13
  Filled 2023-03-20: qty 100, 34d supply, fill #0
  Filled 2023-04-03: qty 100, 30d supply, fill #0

## 2023-03-20 MED ORDER — ONETOUCH DELICA PLUS LANCET33G MISC
1.0000 | Freq: Three times a day (TID) | 11 refills | Status: AC
  Filled 2023-03-20 – 2023-04-03 (×2): qty 100, 33d supply, fill #0
  Filled 2023-09-17: qty 100, 33d supply, fill #1

## 2023-03-20 MED ORDER — LANCET DEVICE MISC
1.0000 | Freq: Three times a day (TID) | 0 refills | Status: AC
Start: 2023-03-20 — End: 2023-04-19
  Filled 2023-03-20: qty 1, 30d supply, fill #0

## 2023-03-20 MED ORDER — MOUNJARO 7.5 MG/0.5ML ~~LOC~~ SOAJ
7.5000 mg | SUBCUTANEOUS | 0 refills | Status: DC
Start: 2023-03-20 — End: 2023-04-11
  Filled 2023-03-20 – 2023-03-25 (×3): qty 2, 28d supply, fill #0

## 2023-03-20 MED ORDER — ONETOUCH VERIO REFLECT W/DEVICE KIT
1.0000 | PACK | Freq: Three times a day (TID) | 0 refills | Status: AC
Start: 2023-03-20 — End: ?
  Filled 2023-03-20 – 2023-04-03 (×3): qty 1, 30d supply, fill #0
  Filled 2023-04-03: qty 1, 1d supply, fill #0

## 2023-03-20 NOTE — Patient Instructions (Signed)
Your prescriptions were sent to [facility]  It is located on [address] down from the hospital.  They area open until 6pm each weekday.  Unfortunately no drive through or weekend options.

## 2023-03-20 NOTE — Progress Notes (Signed)
Established Patient Office Visit   Subjective  Patient ID: Brenda Acosta, female    DOB: 1976/09/14  Age: 47 y.o. MRN: [id number]  Chief Complaint  Patient presents with   Annual Exam    Pt is a 47 yo female with pmh sig for DM II, obesity, h/o depression, s/p gastric sleeve, h/o anxiety, thrombocytosis who was seen for CPE and f/u on ongoing concerns.  Pt states she has been doing well.  Notes 20 lbs wt gain as Mounjaro 7.5 mg not in stock at pharmacy x 6 wks.  Medication suppressed appetite.  No pt feeling ravenous.  May have dinner at 9 pm.  Unable to check bs as misplaced glucometer.  Notes increased need for lasix since wt gain.  Had IUD removed and ablation in March due to continuous spotting since having IUD placed in 2022.       Past Medical History:  Diagnosis Date   Chest pain    a. 09/2015 Ex MV: EF >55%, no ischemia/infarct.   Diabetes mellitus without complication    a. 11/20/2015 A1c 11.5;  b.  noncompliant w/ diabetes meds, glucose checks, f/u, etc.   Dyspnea on exertion    a. 09/2015 Echo: Ef 60-65%, no rwma, mild AI, mildly dil LA.   Essential hypertension    Hyperlipidemia    Morbid obesity    Mucinous cystadenoma of ovary    Hx of    Noncompliance    Past Surgical History:  Procedure Laterality Date   ABDOMINAL HYSTERECTOMY     partial hyst   coloscopy     LAPAROSCOPIC GASTRIC SLEEVE RESECTION  01/08/2018   MOUTH SURGERY     OOPHORECTOMY  07/02/2006   Rt; large benign ovarian tumor   Social History   Tobacco Use   Smoking status: Never   Smokeless tobacco: Never  Vaping Use   Vaping Use: Never used  Substance Use Topics   Alcohol use: No   Drug use: No   Family History  Problem Relation Age of Onset   Heart disease Mother 88       heart attack   Hypertension Father    Diabetes Father        Type II   Cancer Maternal Grandfather        prostate cancer   Colon cancer Neg Hx    Rectal cancer Neg Hx    Stomach cancer Neg Hx     Allergies  Allergen Reactions   Benzoyl Peroxide Other (See Comments)    Burns       ROS Negative unless stated above    Objective:     BP 112/72 (BP Location: Left Arm, Patient Position: Sitting, Cuff Size: Normal)   Temp 98.7 F (37.1 C) (Oral)   Ht  (1.676 m)   Wt 219 lb 6.4 oz (99.5 kg)   BMI 35.41 kg/m  BP Readings from Last 3 Encounters:  03/20/23 112/72  06/26/22 96/70  06/05/22 100/70   Wt Readings from Last 3 Encounters:  03/20/23 219 lb 6.4 oz (99.5 kg)  06/26/22 206 lb 9.6 oz (93.7 kg)  06/05/22 211 lb 12.8 oz (96.1 kg)      Physical Exam Constitutional:      Appearance: Normal appearance.  HENT:     Head: Normocephalic and atraumatic.     Right Ear: Tympanic membrane, ear canal and external ear normal.     Left Ear: Tympanic membrane, ear canal and external ear normal.  Nose: Nose normal.     Mouth/Throat:     Mouth: Mucous membranes are moist.     Pharynx: No oropharyngeal exudate or posterior oropharyngeal erythema.  Eyes:     General: No scleral icterus.    Extraocular Movements: Extraocular movements intact.     Conjunctiva/sclera: Conjunctivae normal.     Pupils: Pupils are equal, round, and reactive to light.  Neck:     Thyroid: No thyromegaly.  Cardiovascular:     Rate and Rhythm: Normal rate and regular rhythm.     Pulses: Normal pulses.     Heart sounds: Normal heart sounds. No murmur heard.    No friction rub.  Pulmonary:     Effort: Pulmonary effort is normal.     Breath sounds: Normal breath sounds. No wheezing, rhonchi or rales.  Abdominal:     General: Bowel sounds are normal.     Palpations: Abdomen is soft.     Tenderness: There is no abdominal tenderness.  Musculoskeletal:        General: No deformity. Normal range of motion.  Lymphadenopathy:     Cervical: No cervical adenopathy.  Skin:    General: Skin is warm and dry.     Findings: No lesion.  Neurological:     General: No focal deficit present.      Mental Status: She is alert and oriented to person, place, and time.  Psychiatric:        Mood and Affect: Mood normal.        Thought Content: Thought content normal.      No results found for any visits on 03/20/23.    Assessment & Plan:  Well adult exam -Anticipatory guidance given including wearing seatbelts, smoke detectors in the home, increasing physical activity, increasing p.o. intake of water and vegetables. -labs.  CBC and Vitamin b12 reviewed from heme/onc visit 03/17/23 -immunizations reviewed -mammogram to be scheduled  Class 2 severe obesity with serious comorbidity and body mass index (BMI) of 35.0 to 35.9 in adult, unspecified obesity type -s/p gastric sleeve -Body mass index is 35.41 kg/m. -20 lbs wt gain x 6 wks while off Mounjaro due to supply issues at pharmacy. -     Comprehensive metabolic panel -     VITAMIN D 25 Hydroxy (Vit-D Deficiency, Fractures) -     TSH -     T4, free  Mixed hyperlipidemia -     Comprehensive metabolic panel -     Lipid panel  Bilateral lower extremity edema -     Furosemide; Take one tab daily prn for lower extremity edema  Dispense: 90 tablet; Refill: 1  Type 2 diabetes mellitus with hyperglycemia, without long-term current use of insulin -Hemoglobin A1c 8.5% on 06/26/2022 -Eye exam scheduled for October - -     Comprehensive metabolic panel -     TSH -     T4, free -     Hemoglobin A1c -     Mounjaro; INJECT 7.5 MG INTO THE SKIN ONCE A WEEK  Dispense: 6 mL; Refill: 0 -     Blood Glucose Monitoring Suppl; 1 each by Does not apply route in the morning, at noon, and at bedtime. May substitute to any manufacturer covered by patient's insurance.  Dispense: 1 each; Refill: 0 -     Blood Glucose Test; 1 each by In Vitro route in the morning, at noon, and at bedtime. May substitute to any manufacturer covered by patient's insurance.  Dispense: 100 strip; Refill:  11 -     Lancet Device; 1 each by Does not apply route in the  morning, at noon, and at bedtime. May substitute to any manufacturer covered by patient's insurance.  Dispense: 1 each; Refill: 0 -     Lancets Misc.; 1 each by Does not apply route in the morning, at noon, and at bedtime. May substitute to any manufacturer covered by patient's insurance.  Dispense: 100 each; Refill: 11  Essential hypertension -Continue current medication including lisinopril-hydrochlorothiazide 20-25 mg daily -Lasix as needed for LE edema. -Lifestyle modifications encouraged  Return in about 3 months (around 06/19/2023).   Deeann Saint, MD

## 2023-03-21 ENCOUNTER — Other Ambulatory Visit (HOSPITAL_COMMUNITY): Payer: Self-pay

## 2023-03-25 ENCOUNTER — Other Ambulatory Visit (HOSPITAL_COMMUNITY): Payer: Self-pay

## 2023-03-26 DIAGNOSIS — F411 Generalized anxiety disorder: Secondary | ICD-10-CM | POA: Diagnosis not present

## 2023-03-31 ENCOUNTER — Other Ambulatory Visit (HOSPITAL_COMMUNITY): Payer: Self-pay

## 2023-04-03 ENCOUNTER — Other Ambulatory Visit (HOSPITAL_COMMUNITY): Payer: Self-pay

## 2023-04-09 ENCOUNTER — Encounter: Payer: Self-pay | Admitting: Family Medicine

## 2023-04-09 DIAGNOSIS — E1139 Type 2 diabetes mellitus with other diabetic ophthalmic complication: Secondary | ICD-10-CM

## 2023-04-11 ENCOUNTER — Other Ambulatory Visit (HOSPITAL_COMMUNITY): Payer: Self-pay

## 2023-04-11 MED ORDER — TIRZEPATIDE 10 MG/0.5ML ~~LOC~~ SOAJ
10.0000 mg | SUBCUTANEOUS | 1 refills | Status: DC
Start: 2023-04-11 — End: 2023-05-22
  Filled 2023-04-11: qty 2, 28d supply, fill #0
  Filled 2023-05-16 – 2023-05-20 (×2): qty 2, 28d supply, fill #1

## 2023-04-15 ENCOUNTER — Other Ambulatory Visit (HOSPITAL_COMMUNITY): Payer: Self-pay

## 2023-04-21 ENCOUNTER — Other Ambulatory Visit (HOSPITAL_COMMUNITY): Payer: Self-pay

## 2023-04-22 ENCOUNTER — Other Ambulatory Visit (HOSPITAL_COMMUNITY): Payer: Self-pay

## 2023-04-23 DIAGNOSIS — F411 Generalized anxiety disorder: Secondary | ICD-10-CM | POA: Diagnosis not present

## 2023-04-24 ENCOUNTER — Other Ambulatory Visit: Payer: Self-pay | Admitting: Family Medicine

## 2023-04-24 DIAGNOSIS — E1139 Type 2 diabetes mellitus with other diabetic ophthalmic complication: Secondary | ICD-10-CM

## 2023-04-24 MED ORDER — GLIPIZIDE 5 MG PO TABS
5.0000 mg | ORAL_TABLET | Freq: Two times a day (BID) | ORAL | 1 refills | Status: DC
Start: 2023-04-24 — End: 2024-06-23

## 2023-04-24 NOTE — Progress Notes (Signed)
Rx for glipizide 5 mg BID sent to pt's pharmacy due to concerns for hyperglycemia while mounjaro out of stock.  Abbe Amsterdam, MD

## 2023-05-16 ENCOUNTER — Other Ambulatory Visit (HOSPITAL_COMMUNITY): Payer: Self-pay

## 2023-05-16 DIAGNOSIS — M654 Radial styloid tenosynovitis [de Quervain]: Secondary | ICD-10-CM | POA: Diagnosis not present

## 2023-05-16 MED ORDER — METHYLPREDNISOLONE 4 MG PO TABS
ORAL_TABLET | ORAL | 0 refills | Status: DC
  Filled 2023-05-16: qty 21, 6d supply, fill #0

## 2023-05-18 ENCOUNTER — Other Ambulatory Visit (HOSPITAL_COMMUNITY): Payer: Self-pay

## 2023-05-19 ENCOUNTER — Other Ambulatory Visit: Payer: Self-pay

## 2023-05-20 ENCOUNTER — Other Ambulatory Visit (HOSPITAL_COMMUNITY): Payer: Self-pay

## 2023-05-20 NOTE — Telephone Encounter (Signed)
Requesting it go to  [facility] Phone: [phone]  Fax: [phone]

## 2023-05-20 NOTE — Telephone Encounter (Signed)
Patient calling to check on progress of this request, increasing the medication and obtaining a 90d supply.

## 2023-05-22 ENCOUNTER — Other Ambulatory Visit: Payer: Self-pay | Admitting: Family Medicine

## 2023-05-22 DIAGNOSIS — E1139 Type 2 diabetes mellitus with other diabetic ophthalmic complication: Secondary | ICD-10-CM

## 2023-05-22 MED ORDER — TIRZEPATIDE 12.5 MG/0.5ML ~~LOC~~ SOAJ
12.5000 mg | SUBCUTANEOUS | 4 refills | Status: DC
Start: 2023-05-22 — End: 2023-09-18
  Filled 2023-05-22 – 2023-06-11 (×3): qty 2, 28d supply, fill #0
  Filled 2023-07-04: qty 2, 28d supply, fill #1
  Filled 2023-08-01: qty 2, 28d supply, fill #2
  Filled 2023-09-17: qty 2, 28d supply, fill #3

## 2023-05-22 MED ORDER — TIRZEPATIDE 12.5 MG/0.5ML ~~LOC~~ SOAJ
12.5000 mg | SUBCUTANEOUS | 4 refills | Status: DC
Start: 2023-05-22 — End: 2023-05-22
  Filled 2023-05-22: qty 6, 84d supply, fill #0

## 2023-05-23 ENCOUNTER — Other Ambulatory Visit: Payer: Self-pay

## 2023-05-23 ENCOUNTER — Other Ambulatory Visit (HOSPITAL_COMMUNITY): Payer: Self-pay

## 2023-05-30 ENCOUNTER — Other Ambulatory Visit (HOSPITAL_COMMUNITY): Payer: Self-pay

## 2023-05-31 ENCOUNTER — Other Ambulatory Visit: Payer: Self-pay | Admitting: Family Medicine

## 2023-05-31 DIAGNOSIS — I10 Essential (primary) hypertension: Secondary | ICD-10-CM

## 2023-06-03 ENCOUNTER — Other Ambulatory Visit: Payer: Self-pay | Admitting: Family Medicine

## 2023-06-03 DIAGNOSIS — R051 Acute cough: Secondary | ICD-10-CM

## 2023-06-04 DIAGNOSIS — F411 Generalized anxiety disorder: Secondary | ICD-10-CM | POA: Diagnosis not present

## 2023-06-04 MED ORDER — BENZONATATE 100 MG PO CAPS
100.0000 mg | ORAL_CAPSULE | Freq: Three times a day (TID) | ORAL | 0 refills | Status: DC | PRN
Start: 2023-06-04 — End: 2024-07-27

## 2023-06-11 ENCOUNTER — Other Ambulatory Visit (HOSPITAL_COMMUNITY): Payer: Self-pay

## 2023-06-18 ENCOUNTER — Inpatient Hospital Stay: Payer: BC Managed Care – PPO | Attending: Oncology

## 2023-06-18 DIAGNOSIS — Z79899 Other long term (current) drug therapy: Secondary | ICD-10-CM | POA: Insufficient documentation

## 2023-06-18 DIAGNOSIS — D509 Iron deficiency anemia, unspecified: Secondary | ICD-10-CM | POA: Insufficient documentation

## 2023-06-18 DIAGNOSIS — D75839 Thrombocytosis, unspecified: Secondary | ICD-10-CM | POA: Diagnosis not present

## 2023-06-18 DIAGNOSIS — F411 Generalized anxiety disorder: Secondary | ICD-10-CM | POA: Diagnosis not present

## 2023-06-18 LAB — RETIC PANEL
Immature Retic Fract: 4.3 % (ref 2.3–15.9)
RBC.: 4.05 MIL/uL (ref 3.87–5.11)
Retic Count, Absolute: 57.1 10*3/uL (ref 19.0–186.0)
Retic Ct Pct: 1.4 % (ref 0.4–3.1)
Reticulocyte Hemoglobin: 33 pg (ref >27.9)

## 2023-06-18 LAB — CMP (CANCER CENTER ONLY)
ALT: 15 U/L (ref 0–44)
AST: 17 U/L (ref 15–41)
Albumin: 4 g/dL (ref 3.5–5.0)
Alkaline Phosphatase: 42 U/L (ref 38–126)
Anion gap: 7 (ref 5–15)
BUN: 21 mg/dL — ABNORMAL HIGH (ref 6–20)
CO2: 24 mmol/L (ref 22–32)
Calcium: 8.8 mg/dL — ABNORMAL LOW (ref 8.9–10.3)
Chloride: 106 mmol/L (ref 98–111)
Creatinine: 1.11 mg/dL — ABNORMAL HIGH (ref 0.44–1.00)
GFR, Estimated: >60 mL/min (ref >60)
Glucose, Bld: 97 mg/dL (ref 70–99)
Potassium: 3.5 mmol/L (ref 3.5–5.1)
Sodium: 137 mmol/L (ref 135–145)
Total Bilirubin: 0.5 mg/dL (ref 0.3–1.2)
Total Protein: 7.5 g/dL (ref 6.5–8.1)

## 2023-06-18 LAB — IRON AND TIBC
Iron: 91 ug/dL (ref 28–170)
Saturation Ratios: 28 % (ref 10.4–31.8)
TIBC: 330 ug/dL (ref 250–450)
UIBC: 239 ug/dL

## 2023-06-18 LAB — CBC WITH DIFFERENTIAL (CANCER CENTER ONLY)
Abs Immature Granulocytes: 0.01 10*3/uL (ref 0.00–0.07)
Basophils Absolute: 0.1 10*3/uL (ref 0.0–0.1)
Basophils Relative: 1 %
Eosinophils Absolute: 0.2 10*3/uL (ref 0.0–0.5)
Eosinophils Relative: 3 %
HCT: 34.9 % — ABNORMAL LOW (ref 36.0–46.0)
Hemoglobin: 11.7 g/dL — ABNORMAL LOW (ref 12.0–15.0)
Immature Granulocytes: 0 %
Lymphocytes Relative: 39 %
Lymphs Abs: 2.9 10*3/uL (ref 0.7–4.0)
MCH: 29.4 pg (ref 26.0–34.0)
MCHC: 33.5 g/dL (ref 30.0–36.0)
MCV: 87.7 fL (ref 80.0–100.0)
Monocytes Absolute: 0.4 10*3/uL (ref 0.1–1.0)
Monocytes Relative: 5 %
Neutro Abs: 3.9 10*3/uL (ref 1.7–7.7)
Neutrophils Relative %: 52 %
Platelet Count: 419 10*3/uL — ABNORMAL HIGH (ref 150–400)
RBC: 3.98 MIL/uL (ref 3.87–5.11)
RDW: 12.8 % (ref 11.5–15.5)
WBC Count: 7.4 10*3/uL (ref 4.0–10.5)
nRBC: 0 % (ref 0.0–0.2)

## 2023-06-18 LAB — FOLATE: Folate: 14.7 ng/mL (ref >5.9)

## 2023-06-18 LAB — FERRITIN: Ferritin: 74 ng/mL (ref 11–307)

## 2023-06-18 LAB — VITAMIN B12: Vitamin B-12: 648 pg/mL (ref 180–914)

## 2023-06-19 ENCOUNTER — Ambulatory Visit (INDEPENDENT_AMBULATORY_CARE_PROVIDER_SITE_OTHER): Payer: BC Managed Care – PPO | Admitting: Family Medicine

## 2023-06-19 ENCOUNTER — Encounter: Payer: Self-pay | Admitting: Family Medicine

## 2023-06-19 VITALS — BP 98/68 | HR 87 | Temp 98.2°F | Wt 198.8 lb

## 2023-06-19 DIAGNOSIS — Z7985 Long-term (current) use of injectable non-insulin antidiabetic drugs: Secondary | ICD-10-CM

## 2023-06-19 DIAGNOSIS — E118 Type 2 diabetes mellitus with unspecified complications: Secondary | ICD-10-CM | POA: Diagnosis not present

## 2023-06-19 DIAGNOSIS — E669 Obesity, unspecified: Secondary | ICD-10-CM

## 2023-06-19 DIAGNOSIS — Z6832 Body mass index (BMI) 32.0-32.9, adult: Secondary | ICD-10-CM | POA: Diagnosis not present

## 2023-06-19 LAB — POCT GLYCOSYLATED HEMOGLOBIN (HGB A1C): Hemoglobin A1C: 6.2 % — AB (ref 4.0–5.6)

## 2023-06-19 NOTE — Progress Notes (Signed)
Established Patient Office Visit   Subjective  Patient ID: Brenda Acosta, female    DOB: [birth year]  Age: 47 y.o. MRN: [id number]  Chief Complaint  Patient presents with   Medical Management of Chronic Issues    Weight loss is slowly getting there. Numbers are good    Patient is a 47 year old female seen for follow-up.  Patient states she is doing well on Mounjaro.  Will be starting the 12.5 mg dose today.  Since patient has been able to get medication consistently she has noticed about a 2 pound weight loss per week.  Was to 219 lbs at visit on 03/20/2023, now 198.8 pounds.  Would like to get down to 165 lbs.  Patient endorses struggling with weight for most of her life, weighing 350 lbs at the most.  patient working out consistently.  Notes medication suppresses appetite but eating throughout the day and drinking more water.  Patient also decided to start checking blood sugar.  Typically no higher than 120s throughout the day.  Having a BM every 3 days.  Patient denies neuropathy, nausea, vomiting, diarrhea.  Not currently checking BP at home.  Noted some edema due to sitting the majority of the day.  Wearing compression socks.    Past Medical History:  Diagnosis Date   Chest pain    a. 09/2015 Ex MV: EF >55%, no ischemia/infarct.   Diabetes mellitus without complication (HCC)    a. 11/20/2015 A1c 11.5;  b.  noncompliant w/ diabetes meds, glucose checks, f/u, etc.   Dyspnea on exertion    a. 09/2015 Echo: Ef 60-65%, no rwma, mild AI, mildly dil LA.   Essential hypertension    Hyperlipidemia    Morbid obesity (HCC)    Mucinous cystadenoma of ovary    Hx of    Noncompliance    Past Surgical History:  Procedure Laterality Date   ABDOMINAL HYSTERECTOMY     partial hyst   coloscopy     LAPAROSCOPIC GASTRIC SLEEVE RESECTION  01/08/2018   MOUTH SURGERY     OOPHORECTOMY  07/02/2006   Rt; large benign ovarian tumor   Social History   Tobacco Use   Smoking status: Never    Smokeless tobacco: Never  Vaping Use   Vaping status: Never Used  Substance Use Topics   Alcohol use: No   Drug use: No   Family History  Problem Relation Age of Onset   Heart disease Mother 21       heart attack   Hypertension Father    Diabetes Father        Type II   Cancer Maternal Grandfather        prostate cancer   Colon cancer Neg Hx    Rectal cancer Neg Hx    Stomach cancer Neg Hx    Allergies  Allergen Reactions   Benzoyl Peroxide Other (See Comments)    Burns       ROS Negative unless stated above    Objective:     BP 98/68 (BP Location: Right Arm, Patient Position: Sitting, Cuff Size: Normal)   Pulse 87   Temp 98.2 F (36.8 C) (Oral)   Wt 198 lb 12.8 oz (90.2 kg)   BMI 32.09 kg/m  BP Readings from Last 3 Encounters:  06/19/23 98/68  03/20/23 112/72  06/26/22 96/70   Wt Readings from Last 3 Encounters:  06/19/23 198 lb 12.8 oz (90.2 kg)  03/20/23 219 lb 6.4 oz (99.5 kg)  06/26/22 206  lb 9.6 oz (93.7 kg)      Physical Exam Constitutional:      General: She is not in acute distress.    Appearance: Normal appearance.  HENT:     Head: Normocephalic and atraumatic.     Nose: Nose normal.     Mouth/Throat:     Mouth: Mucous membranes are moist.  Cardiovascular:     Rate and Rhythm: Normal rate and regular rhythm.     Heart sounds: Normal heart sounds. No murmur heard.    No gallop.  Pulmonary:     Effort: Pulmonary effort is normal. No respiratory distress.     Breath sounds: Normal breath sounds. No wheezing, rhonchi or rales.  Skin:    General: Skin is warm and dry.  Neurological:     Mental Status: She is alert and oriented to person, place, and time.      No results found for any visits on 06/19/23.    Assessment & Plan:  Controlled type 2 diabetes mellitus with complication, without long-term current use of insulin (HCC) -     POCT glycosylated hemoglobin (Hb A1C) -     Microalbumin / creatinine urine ratio  Class 1  obesity with serious comorbidity and body mass index (BMI) of 32.0 to 32.9 in adult, unspecified obesity type -Body mass index is 32.09 kg/m.  Patient doing well on Mounjaro.  Will start 12.5 mg weekly dose today.  Continue to monitor for GI symptoms.  Congratulated on weight loss.  Continue exercising and lifestyle modifications.  Patient declines foot exam this visit.  Yearly eye exam due in October.  Return in about 4 months (around 10/20/2023).   Deeann Saint, MD

## 2023-06-20 LAB — MICROALBUMIN / CREATININE URINE RATIO
Creatinine,U: 200.8 mg/dL
Microalb Creat Ratio: 0.3 mg/g (ref 0.0–30.0)
Microalb, Ur: <0.7 mg/dL (ref 0.0–1.9)

## 2023-06-20 LAB — ZINC: Zinc: 62 ug/dL (ref 44–115)

## 2023-06-30 ENCOUNTER — Encounter: Payer: Self-pay | Admitting: Oncology

## 2023-06-30 NOTE — Telephone Encounter (Signed)
Pt only had appt for labs. Please advise on results. She has follow up scheduled in October

## 2023-07-02 DIAGNOSIS — F411 Generalized anxiety disorder: Secondary | ICD-10-CM | POA: Diagnosis not present

## 2023-07-07 ENCOUNTER — Other Ambulatory Visit (HOSPITAL_COMMUNITY): Payer: Self-pay

## 2023-07-16 DIAGNOSIS — F411 Generalized anxiety disorder: Secondary | ICD-10-CM | POA: Diagnosis not present

## 2023-07-16 DIAGNOSIS — M654 Radial styloid tenosynovitis [de Quervain]: Secondary | ICD-10-CM | POA: Diagnosis not present

## 2023-07-23 ENCOUNTER — Encounter: Payer: Self-pay | Admitting: Family Medicine

## 2023-07-23 DIAGNOSIS — R252 Cramp and spasm: Secondary | ICD-10-CM

## 2023-07-23 NOTE — Telephone Encounter (Signed)
Potassium 3.5 on 7/17.

## 2023-07-25 ENCOUNTER — Other Ambulatory Visit (HOSPITAL_COMMUNITY): Payer: Self-pay

## 2023-07-25 ENCOUNTER — Other Ambulatory Visit: Payer: Self-pay | Admitting: Oncology

## 2023-07-25 MED ORDER — B-12 1000 MCG SL SUBL
1000.0000 ug | SUBLINGUAL_TABLET | SUBLINGUAL | 0 refills | Status: DC
  Filled 2023-07-25 – 2023-09-17 (×2): qty 90, fill #0

## 2023-07-28 ENCOUNTER — Other Ambulatory Visit (HOSPITAL_COMMUNITY): Payer: Self-pay

## 2023-07-28 ENCOUNTER — Encounter: Payer: Self-pay | Admitting: Family Medicine

## 2023-07-28 MED ORDER — POTASSIUM CHLORIDE CRYS ER 20 MEQ PO TBCR
20.0000 meq | EXTENDED_RELEASE_TABLET | Freq: Every day | ORAL | 0 refills | Status: DC
  Filled 2023-07-28: qty 90, 90d supply, fill #0

## 2023-07-29 ENCOUNTER — Other Ambulatory Visit (HOSPITAL_COMMUNITY): Payer: Self-pay

## 2023-07-29 DIAGNOSIS — F411 Generalized anxiety disorder: Secondary | ICD-10-CM | POA: Diagnosis not present

## 2023-08-01 ENCOUNTER — Other Ambulatory Visit: Payer: Self-pay

## 2023-08-05 ENCOUNTER — Other Ambulatory Visit: Payer: Self-pay

## 2023-08-06 DIAGNOSIS — F411 Generalized anxiety disorder: Secondary | ICD-10-CM | POA: Diagnosis not present

## 2023-08-12 ENCOUNTER — Other Ambulatory Visit (HOSPITAL_COMMUNITY): Payer: Self-pay

## 2023-08-19 DIAGNOSIS — F411 Generalized anxiety disorder: Secondary | ICD-10-CM | POA: Diagnosis not present

## 2023-09-01 ENCOUNTER — Encounter: Payer: Self-pay | Admitting: Family Medicine

## 2023-09-02 ENCOUNTER — Other Ambulatory Visit (HOSPITAL_COMMUNITY): Payer: Self-pay

## 2023-09-02 MED ORDER — LISINOPRIL-HYDROCHLOROTHIAZIDE 20-25 MG PO TABS
1.0000 | ORAL_TABLET | Freq: Every day | ORAL | 1 refills | Status: DC
  Filled 2023-09-02 – 2023-09-17 (×2): qty 90, 90d supply, fill #0

## 2023-09-02 MED FILL — Lisinopril & Hydrochlorothiazide Tab 20-25 MG: ORAL | 90 days supply | Qty: 90 | Fill #0 | Status: CN

## 2023-09-03 DIAGNOSIS — F411 Generalized anxiety disorder: Secondary | ICD-10-CM | POA: Diagnosis not present

## 2023-09-12 ENCOUNTER — Other Ambulatory Visit (HOSPITAL_COMMUNITY): Payer: Self-pay

## 2023-09-17 ENCOUNTER — Other Ambulatory Visit: Payer: Self-pay

## 2023-09-17 ENCOUNTER — Other Ambulatory Visit (HOSPITAL_COMMUNITY): Payer: Self-pay

## 2023-09-18 ENCOUNTER — Inpatient Hospital Stay: Payer: BC Managed Care – PPO

## 2023-09-18 ENCOUNTER — Other Ambulatory Visit (HOSPITAL_COMMUNITY): Payer: Self-pay

## 2023-09-18 ENCOUNTER — Other Ambulatory Visit: Payer: Self-pay

## 2023-09-18 ENCOUNTER — Other Ambulatory Visit: Payer: Self-pay | Admitting: Family Medicine

## 2023-09-18 DIAGNOSIS — E1139 Type 2 diabetes mellitus with other diabetic ophthalmic complication: Secondary | ICD-10-CM

## 2023-09-18 DIAGNOSIS — E66812 Obesity, class 2: Secondary | ICD-10-CM

## 2023-09-18 MED ORDER — TIRZEPATIDE 15 MG/0.5ML ~~LOC~~ SOAJ
15.0000 mg | SUBCUTANEOUS | 1 refills | Status: DC
Start: 2023-09-18 — End: 2024-01-14
  Filled 2023-09-18: qty 6, 84d supply, fill #0
  Filled 2023-12-04 – 2023-12-09 (×2): qty 6, 84d supply, fill #1

## 2023-09-18 NOTE — Telephone Encounter (Signed)
Rx sent.  Patient needs to set up follow-up appointment.

## 2023-09-19 ENCOUNTER — Other Ambulatory Visit (HOSPITAL_COMMUNITY): Payer: Self-pay

## 2023-09-22 ENCOUNTER — Inpatient Hospital Stay: Payer: BC Managed Care – PPO | Admitting: Oncology

## 2023-09-23 ENCOUNTER — Inpatient Hospital Stay: Payer: BC Managed Care – PPO | Attending: Oncology

## 2023-09-23 ENCOUNTER — Telehealth: Payer: BC Managed Care – PPO | Admitting: Physician Assistant

## 2023-09-23 DIAGNOSIS — R0789 Other chest pain: Secondary | ICD-10-CM

## 2023-09-23 DIAGNOSIS — D509 Iron deficiency anemia, unspecified: Secondary | ICD-10-CM | POA: Diagnosis not present

## 2023-09-23 DIAGNOSIS — Z79899 Other long term (current) drug therapy: Secondary | ICD-10-CM | POA: Diagnosis not present

## 2023-09-23 DIAGNOSIS — D75839 Thrombocytosis, unspecified: Secondary | ICD-10-CM | POA: Diagnosis not present

## 2023-09-23 LAB — CBC WITH DIFFERENTIAL (CANCER CENTER ONLY)
Abs Immature Granulocytes: 0.08 10*3/uL — ABNORMAL HIGH (ref 0.00–0.07)
Basophils Absolute: 0.1 10*3/uL (ref 0.0–0.1)
Basophils Relative: 1 %
Eosinophils Absolute: 0.2 10*3/uL (ref 0.0–0.5)
Eosinophils Relative: 3 %
HCT: 34 % — ABNORMAL LOW (ref 36.0–46.0)
Hemoglobin: 11.1 g/dL — ABNORMAL LOW (ref 12.0–15.0)
Immature Granulocytes: 1 %
Lymphocytes Relative: 35 %
Lymphs Abs: 2.7 10*3/uL (ref 0.7–4.0)
MCH: 29.8 pg (ref 26.0–34.0)
MCHC: 32.6 g/dL (ref 30.0–36.0)
MCV: 91.2 fL (ref 80.0–100.0)
Monocytes Absolute: 0.5 10*3/uL (ref 0.1–1.0)
Monocytes Relative: 6 %
Neutro Abs: 4.3 10*3/uL (ref 1.7–7.7)
Neutrophils Relative %: 54 %
Platelet Count: 383 10*3/uL (ref 150–400)
RBC: 3.73 MIL/uL — ABNORMAL LOW (ref 3.87–5.11)
RDW: 12.5 % (ref 11.5–15.5)
WBC Count: 7.9 10*3/uL (ref 4.0–10.5)
nRBC: 0 % (ref 0.0–0.2)

## 2023-09-23 LAB — VITAMIN B12: Vitamin B-12: 473 pg/mL (ref 180–914)

## 2023-09-23 LAB — RETIC PANEL
Immature Retic Fract: 4.9 % (ref 2.3–15.9)
RBC.: 3.8 MIL/uL — ABNORMAL LOW (ref 3.87–5.11)
Retic Count, Absolute: 45.2 10*3/uL (ref 19.0–186.0)
Retic Ct Pct: 1.2 % (ref 0.4–3.1)
Reticulocyte Hemoglobin: 32 pg (ref >27.9)

## 2023-09-23 LAB — IRON AND TIBC
Iron: 52 ug/dL (ref 28–170)
Saturation Ratios: 16 % (ref 10.4–31.8)
TIBC: 321 ug/dL (ref 250–450)
UIBC: 269 ug/dL

## 2023-09-23 LAB — FERRITIN: Ferritin: 51 ng/mL (ref 11–307)

## 2023-09-23 NOTE — Progress Notes (Signed)
Because of chest pressure that warrants need for examination and assessment of oxygen, I feel your condition warrants further evaluation and I recommend that you be seen in a face to face visit.   NOTE: There will be NO CHARGE for this eVisit   If you are having a true medical emergency please call 911.      For an urgent face to face visit, [place] has eight urgent care centers for your convenience:   NEW!! [facility] at [facility] Get Driving Directions [phone] [address], [address] [place], [address]    [facility] Ur[facility] Care Center at [facility] Get Driving Directions [phone] [address] [place], [place] [address]   [facility] Ur[facility] Care Center [facility]) Get Driving Directions [phone] [address] [place], [place] [address]  [facility] Urgent Care Center [facility]) Get Driving Directions [phone] [address] [place],  [place]  [address]  [facility] Urgent Care Center [facility] - at [employer]  [phone] [id number] [place],  [place] [address]   [facility] Urgent Care at [facility] Get Driving Directions [phone] [address] [place], [place] [address]   [facility] Urgent Care at [facility] Get Driving Directions  [phone] [address] [place], [place] [address]   [facility] Urgent Care at [facility] Directions [phone] [address], [address] [place], [place] [address]  Your MyChart E-visit questionnaire answers were reviewed by a board certified advanced clinical practitioner to complete your personal care plan based on your specific symptoms.  Thank you for using e-Visits.

## 2023-09-24 ENCOUNTER — Ambulatory Visit (HOSPITAL_BASED_OUTPATIENT_CLINIC_OR_DEPARTMENT_OTHER)
Admission: RE | Admit: 2023-09-24 | Discharge: 2023-09-24 | Disposition: A | Discharge status: Home / Self Care | Payer: BC Managed Care – PPO | Source: Ambulatory Visit | Attending: Family Medicine | Admitting: Family Medicine

## 2023-09-24 ENCOUNTER — Encounter: Payer: Self-pay | Admitting: Family Medicine

## 2023-09-24 ENCOUNTER — Ambulatory Visit (INDEPENDENT_AMBULATORY_CARE_PROVIDER_SITE_OTHER): Payer: BC Managed Care – PPO | Admitting: Family Medicine

## 2023-09-24 ENCOUNTER — Other Ambulatory Visit (HOSPITAL_COMMUNITY): Payer: Self-pay

## 2023-09-24 ENCOUNTER — Other Ambulatory Visit: Payer: Self-pay | Admitting: Family Medicine

## 2023-09-24 ENCOUNTER — Other Ambulatory Visit: Payer: Self-pay

## 2023-09-24 VITALS — BP 98/68 | HR 73 | Temp 98.8°F | Ht 66.0 in | Wt 188.4 lb

## 2023-09-24 DIAGNOSIS — E66811 Obesity, class 1: Secondary | ICD-10-CM | POA: Diagnosis not present

## 2023-09-24 DIAGNOSIS — R0789 Other chest pain: Secondary | ICD-10-CM

## 2023-09-24 DIAGNOSIS — Z7985 Long-term (current) use of injectable non-insulin antidiabetic drugs: Secondary | ICD-10-CM

## 2023-09-24 DIAGNOSIS — K219 Gastro-esophageal reflux disease without esophagitis: Secondary | ICD-10-CM

## 2023-09-24 DIAGNOSIS — E118 Type 2 diabetes mellitus with unspecified complications: Secondary | ICD-10-CM

## 2023-09-24 DIAGNOSIS — Z6830 Body mass index (BMI) 30.0-30.9, adult: Secondary | ICD-10-CM

## 2023-09-24 DIAGNOSIS — Z903 Acquired absence of stomach [part of]: Secondary | ICD-10-CM

## 2023-09-24 LAB — D-DIMER, QUANTITATIVE: D-Dimer, Quant: 0.43 ug{FEU}/mL (ref <0.50)

## 2023-09-24 MED ORDER — PANTOPRAZOLE SODIUM 20 MG PO TBEC
20.0000 mg | DELAYED_RELEASE_TABLET | Freq: Every day | ORAL | 1 refills | Status: DC
  Filled 2023-09-24: qty 90, 90d supply, fill #0
  Filled 2023-12-04 – 2023-12-09 (×2): qty 90, 90d supply, fill #1

## 2023-09-24 NOTE — Patient Instructions (Signed)
A referral to the Gastroenterologist was placed.  They will contact you regarding scheduling an appt.

## 2023-09-24 NOTE — Progress Notes (Signed)
Established Patient Office Visit   Subjective  Patient ID: Brenda Acosta, female    DOB: 03-14-1976  Age: 47 y.o. MRN: [id number]  Chief Complaint  Patient presents with   Chest Pain    Patient denies Cough and congestion, started 3 weeks ago     Patient is a 67 female seen for ongoing concern.  Patient endorses chest pressure x 3 weeks.  Sensation is intermittent.  Patient states it feels like she has congestion in chest that she cannot get up.  Notices sensation more when at rest such as driving or sitting quietly.  Denies difficulty laying flat when sleeping.  Patient denies tachycardia, wheezing, cough, postnasal drainage, fever, chills, nausea, vomiting, sore throat, rhinorrhea, headache.  Patient flew back from [place] 1 week ago.  Has noticed increased heartburn symptoms.  Requesting refill on Protonix.  Has attempted to stop PPI but ends up taking Tums.  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  Attempted virtual visit but was advised to be seen in person.  Had labs 09/23/2023 which were negative except for hemoglobin, 10.1.  Pt to restart bariatric MVI.  Chest Pain     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/15/2021   IUD (intrauterine device) in place 11/15/2021   Vitamin B12 deficiency 11/15/2021   Depression, major, single episode, moderate (HCC) 06/25/2021   Anxiety 06/25/2021   Thrombocytosis 11/12/2020   BMI 28.0-28.9,adult 08/12/2018   Status post bariatric surgery 04/08/2018   Vitamin D deficiency 06/24/2017   Steatosis of liver 06/03/2017   Dyspnea on exertion    Chest pain    Noncompliance    Morbid obesity (HCC)    Hyperlipidemia    Diabetes mellitus without complication (HCC)    Chest pain on exertion 08/29/2015   Bilateral leg edema 08/29/2015   Type 2  diabetes mellitus with ophthalmic complication (HCC) 06/10/2013   Essential hypertension 06/10/2013   Other and unspecified hyperlipidemia 06/10/2013   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 09/2015 Ex MV: EF >55%, no ischemia/infarct.   Diabetes mellitus without complication (HCC)    a. 11/20/2015 A1c 11.5;  b.  noncompliant w/ diabetes meds, glucose checks, f/u, etc.   Dyspnea on exertion    a. 09/2015 Echo: Ef 60-65%, no rwma, mild AI, mildly dil LA.   Essential hypertension    Hyperlipidemia    Morbid obesity (HCC)    Mucinous cystadenoma of ovary    Hx of    Noncompliance    Past Surgical History:  Procedure Laterality Date   ABDOMINAL HYSTERECTOMY     partial hyst   coloscopy     LAPAROSCOPIC GASTRIC SLEEVE RESECTION  01/08/2018   MOUTH SURGERY     OOPHORECTOMY  07/02/2006   Rt; large benign ovarian tumor   Social History   Tobacco Use   Smoking status: Never   Smokeless tobacco: Never  Vaping Use   Vaping status: Never Used  Substance Use Topics   Alcohol use: No   Drug use: No   Family History  Problem Relation Age of Onset   Heart disease Mother 2       heart attack   Hypertension Father    Diabetes Father  Type II   Cancer Maternal Grandfather        prostate cancer   Colon cancer Neg Hx    Rectal cancer Neg Hx    Stomach cancer Neg Hx    Allergies  Allergen Reactions   Benzoyl Peroxide Other (See Comments)    Burns       Review of Systems  Cardiovascular:  Positive for chest pain.   Negative unless stated above    Objective:     BP 98/68 (BP Location: Left Arm, Patient Position: Sitting, Cuff Size: Normal)   Pulse 73   Temp 98.8 F (37.1 C) (Oral)   Ht 5\' 6"  (1.676 m)   Wt 188 lb 6.4 oz (85.5 kg)   BMI 30.41 kg/m  BP Readings from Last 3 Encounters:  09/24/23 98/68  06/19/23 98/68  03/20/23 112/72   Wt Readings from Last 3 Encounters:  09/24/23 188 lb 6.4  oz (85.5 kg)  06/19/23 198 lb 12.8 oz (90.2 kg)  03/20/23 219 lb 6.4 oz (99.5 kg)      Physical Exam Constitutional:      General: She is not in acute distress.    Appearance: Normal appearance.  HENT:     Head: Normocephalic and atraumatic.     Nose: Nose normal.     Mouth/Throat:     Mouth: Mucous membranes are moist.  Cardiovascular:     Rate and Rhythm: Normal rate and regular rhythm.     Heart sounds: Normal heart sounds. No murmur heard.    No friction rub. No gallop.  Pulmonary:     Effort: Pulmonary effort is normal. No respiratory distress.     Breath sounds: Normal breath sounds. No wheezing, rhonchi or rales.  Chest:     Chest wall: No tenderness. There is no dullness to percussion.  Skin:    General: Skin is warm and dry.     Comments: No rashes on chest.  No TTP of chest wall.  Neurological:     Mental Status: She is alert and oriented to person, place, and time.    No results found for any visits on 09/24/23.    Assessment & Plan:  Chest pressure -Discussed possible causes of acute symptoms including GERD versus anemia.  Infection, pleural effusion, cardiomegaly, pericarditis though less likely also to be considered. -EKG this visit NSR.  Not [place] inverted P waves and flattened T wave in V2.  EKG from 06/20/2017 reviewed for comparison. -Labs from 09/23/2023 reviewed and normal with the exception of anemia, hemoglobin 10.1.  Patient to restart iron supplement. -Will obtain D-dimer to evaluate for possible PE/DVT given recent travel. -Also obtain CXR at [facility]  No Xray tech available this week in clinic due to vacation. -Given strict precautions -     D-dimer, quantitative -     DG Chest 2 View; Future -     Ambulatory referral to Gastroenterology  Controlled type 2 diabetes mellitus with complication, without long-term current use of insulin (HCC) -Hemoglobin A1c 6.2% on 06/19/2023 -Continue Mounjaro 15 mg weekly -Continue glipizide 5 mg  twice daily -Patient encouraged to schedule eye exam -Continue ACE I  Class 1 obesity with serious comorbidity and body mass index (BMI) of 30.0 to 30.9 in adult, unspecified obesity type -Body mass index is 30.41 kg/m. -Weight 188.4 lbs this visit -Weight 198 on 06/19/23. -Continue lifestyle modifications -Continue Mounjaro 15 mg weekly.  Gastroesophageal reflux disease, unspecified whether esophagitis present -Increased heartburn symptoms possibly 2/2 hiatal  hernia. -Protonix refilled -Patient encouraged to keep food diary -Referral to GI for EGD. -     Ambulatory referral to Gastroenterology  History of sleeve gastrectomy -Discussed the importance of taking bariatric multivitamins daily indefinitely -     Ambulatory referral to Gastroenterology   Return in about 2 weeks (around 10/08/2023), or if symptoms worsen or fail to improve.   Deeann Saint, MD

## 2023-09-29 ENCOUNTER — Other Ambulatory Visit (HOSPITAL_COMMUNITY): Payer: Self-pay

## 2023-09-29 ENCOUNTER — Encounter: Payer: Self-pay | Admitting: Oncology

## 2023-09-29 ENCOUNTER — Inpatient Hospital Stay (HOSPITAL_BASED_OUTPATIENT_CLINIC_OR_DEPARTMENT_OTHER): Payer: BC Managed Care – PPO | Admitting: Oncology

## 2023-09-29 DIAGNOSIS — D649 Anemia, unspecified: Secondary | ICD-10-CM | POA: Diagnosis not present

## 2023-09-29 DIAGNOSIS — Z903 Acquired absence of stomach [part of]: Secondary | ICD-10-CM | POA: Diagnosis not present

## 2023-09-29 DIAGNOSIS — D75839 Thrombocytosis, unspecified: Secondary | ICD-10-CM

## 2023-09-29 MED ORDER — B-12 1000 MCG SL SUBL
1000.0000 ug | SUBLINGUAL_TABLET | SUBLINGUAL | 1 refills | Status: AC
  Filled 2023-09-29: qty 90, fill #0

## 2023-09-29 NOTE — Assessment & Plan Note (Signed)
Anemia, hemoglobin decreased Labs reviewed and discussed with patient.  Lab Results  Component Value Date   HGB 11.1 (L) 09/23/2023   TIBC 321 09/23/2023   IRONPCTSAT 16 09/23/2023   FERRITIN 51 09/23/2023     Continue oral iron supplementation. continue sublingual B12 1000 mcg 3 time per week.  Follow up in 6 months.

## 2023-09-29 NOTE — Progress Notes (Signed)
Pt contacted for Mychart visit. No new concerns voiced.  

## 2023-09-29 NOTE — Assessment & Plan Note (Signed)
History of gastric sleeve resection.  Will monitor iron panel, B12, folate periodically.

## 2023-09-29 NOTE — Progress Notes (Signed)
HEMATOLOGY-ONCOLOGY TeleHEALTH VISIT PROGRESS NOTE  I connected with Brenda Acosta on 09/29/23  at  2:45 PM EDT by video enabled telemedicine visit and verified that I am speaking with the correct person using two identifiers. I discussed the limitations, risks, security and privacy concerns of performing an evaluation and management service by telemedicine and the availability of in-person appointments. The patient expressed understanding and agreed to proceed.   Other persons participating in the visit and their role in the encounter:  None  Patient's location: Home  Provider's location: office Chief Complaint: Thrombocytosis, iron deficiency anemia, history of gastric sleeve   INTERVAL HISTORY Brenda Acosta is a 47 y.o. female who has above history reviewed by me today presents for follow up visit for thrombocytosis, iron deficiency anemia, history of gastric sleeve She take oral iron supplementation intermittently. Take multi vitamin that contains iron.  . + fatigue, attributes to recent busy work schedule.  No new complaints.   Past Medical History:  Diagnosis Date   Chest pain    a. 09/2015 Ex MV: EF >55%, no ischemia/infarct.   Diabetes mellitus without complication (HCC)    a. 11/20/2015 A1c 11.5;  b.  noncompliant w/ diabetes meds, glucose checks, f/u, etc.   Dyspnea on exertion    a. 09/2015 Echo: Ef 60-65%, no rwma, mild AI, mildly dil LA.   Essential hypertension    Hyperlipidemia    Morbid obesity (HCC)    Mucinous cystadenoma of ovary    Hx of    Noncompliance    Past Surgical History:  Procedure Laterality Date   ABDOMINAL HYSTERECTOMY     partial hyst   coloscopy     LAPAROSCOPIC GASTRIC SLEEVE RESECTION  01/08/2018   MOUTH SURGERY     OOPHORECTOMY  07/02/2006   Rt; large benign ovarian tumor    Family History  Problem Relation Age of Onset   Heart disease Mother 39       heart attack   Hypertension Father    Diabetes Father        Type II    Cancer Maternal Grandfather        prostate cancer   Colon cancer Neg Hx    Rectal cancer Neg Hx    Stomach cancer Neg Hx     Social History   Socioeconomic History   Marital status: Single    Spouse name: Not on file   Number of children: 0   Years of education: Not on file   Highest education level: Professional school degree (e.g., MD, DDS, DVM, JD)  Occupational History    Employer: [employer]  Tobacco Use   Smoking status: Never   Smokeless tobacco: Never  Vaping Use   Vaping status: Never Used  Substance and Sexual Activity   Alcohol use: No   Drug use: No   Sexual activity: Yes    Partners: Male  Other Topics Concern   Not on file  Social History Narrative   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 Avg. Glucose: 298      Regular exercise: no   Caffeine use: occassionally                  Social Determinants of Health   Financial Resource Strain: Low Risk  (09/23/2023)  Overall Financial Resource Strain (CARDIA)    Difficulty of Paying Living Expenses: Not hard at all  Food Insecurity: No Food Insecurity (09/23/2023)   Hunger Vital Sign    Worried About Running Out of Food in the Last Year: Never true    Ran Out of Food in the Last Year: Never true  Transportation Needs: No Transportation Needs (09/23/2023)   PRAPARE - Administrator, Civil Service (Medical): No    Lack of Transportation (Non-Medical): No  Physical Activity: Unknown (09/23/2023)   Exercise Vital Sign    Days of Exercise per Week: 0 days    Minutes of Exercise per Session: Not on file  Stress: Stress Concern Present (09/23/2023)   [employer] of Occupational Health - Occupational Stress Questionnaire    Feeling of Stress : To some extent  Social Connections: Socially Integrated (09/23/2023)   Social Connection and  Isolation Panel [NHANES]    Frequency of Communication with Friends and Family: More than three times a week    Frequency of Social Gatherings with Friends and Family: More than three times a week    Attends Religious Services: More than 4 times per year    Active Member of [employer] or Organizations: Yes    Attends Engineer, structural: More than 4 times per year    Marital Status: Living with partner  Intimate Partner Violence: Not on file    Current Outpatient Medications on File Prior to Visit  Medication Sig Dispense Refill   BAYER MICROLET LANCETS lancets Use to test blood sugar 2 time daily as instructed. 100 each 5   Blood Glucose Monitoring Suppl (ONETOUCH VERIO REFLECT) w/Device KIT Test blood sugar in the morning, at noon, and at bedtime. 1 kit 0   furosemide (LASIX) 20 MG tablet Take 1 tablet (20 mg total) by mouth daily as needed for lower extremity edema 90 tablet 1   glipiZIDE (GLUCOTROL) 5 MG tablet Take 1 tablet (5 mg total) by mouth 2 (two) times daily before a meal. 180 tablet 1   Glucose Blood (BLOOD GLUCOSE TEST STRIPS) STRP Test blood sugar in the morning, at noon, and at bedtime. 100 strip 11   hydrOXYzine (ATARAX/VISTARIL) 25 MG tablet Take 1 tablet (25 mg total) by mouth at bedtime as needed for anxiety. 30 tablet 3   Insulin Pen Needle (PEN NEEDLES) 32G X 5 MM MISC For daily use. 100 each 2   ipratropium (ATROVENT) 0.03 % nasal spray Place 2 sprays into both nostrils every 12 (twelve) hours. 30 mL 12   Iron-Vitamin C 65-125 MG TABS Take 1 tablet by mouth daily. 30 tablet 2   Lancets (ONETOUCH DELICA PLUS LANCET33G) MISC Test blood sugar in the morning, at noon, and at bedtime. 100 each 11   lisinopril-hydrochlorothiazide (ZESTORETIC) 20-25 MG tablet Take 1 tablet by mouth once daily 90 tablet 1   meloxicam (MOBIC) 7.5 MG tablet Take 1 tablet by mouth once daily 30 tablet 0   pantoprazole (PROTONIX) 20 MG tablet Take 1 tablet (20 mg total) by mouth daily. 90  tablet 1   potassium chloride SA (KLOR-CON M) 20 MEQ tablet Take 1 tablet (20 mEq total) by mouth daily. 90 tablet 0   predniSONE (DELTASONE) 10 MG tablet Take 4 tabs every morning for 3 days, 3 tabs for 2 days, 2 tabs for 2 days, 1 tab for 1 day. 23 tablet 0   senna-docusate (SENOKOT-S) 8.6-50 MG tablet Take 1 tablet by mouth daily. 30 tablet  1   tirzepatide (MOUNJARO) 15 MG/0.5ML Pen Inject 15 mg into the skin once a week. 6 mL 1   benzonatate (TESSALON) 100 MG capsule Take 1 capsule (100 mg total) by mouth 3 (three) times daily as needed. (Patient not taking: Reported on 09/29/2023) 21 capsule 0   fluticasone (FLONASE) 50 MCG/ACT nasal spray Place 2 sprays into both nostrils daily. 16 g 0   lisinopril-hydrochlorothiazide (ZESTORETIC) 20-25 MG tablet Take 1 tablet by mouth daily. (Patient not taking: Reported on 09/29/2023) 90 tablet 1   No current facility-administered medications on file prior to visit.    Allergies  Allergen Reactions   Benzoyl Peroxide Other (See Comments)    Burns     Review of Systems  Constitutional:  Positive for fatigue. Negative for appetite change, chills and fever.  HENT:   Negative for hearing loss.   Cardiovascular:  Negative for chest pain.  Genitourinary:  Negative for difficulty urinating.   Musculoskeletal:  Negative for arthralgias.  Skin:  Negative for itching and rash.  Neurological:  Negative for extremity weakness.  Hematological:  Negative for adenopathy.  Psychiatric/Behavioral:  Negative for confusion.       Observations/Objective: There were no vitals filed for this visit.  There is no height or weight on file to calculate BMI.  Physical Exam Neurological:     Mental Status: She is alert.     Labs    Latest Ref Rng & Units 09/23/2023    8:05 AM 06/18/2023   10:54 AM 03/17/2023    1:44 PM  CBC  WBC 4.0 - 10.5 K/uL 7.9  7.4  10.1   Hemoglobin 12.0 - 15.0 g/dL 62.9  52.8  41.3   Hematocrit 36.0 - 46.0 % 34.0  34.9  32.5    Platelets 150 - 400 K/uL 383  419  362       Latest Ref Rng & Units 06/18/2023   10:53 AM 03/20/2023   11:49 AM 05/09/2022    8:04 AM  CMP  Glucose 70 - 99 mg/dL 97  244  010   BUN 6 - 20 mg/dL 21  23  18    Creatinine 0.44 - 1.00 mg/dL 2.72  5.36  6.44   Sodium 135 - 145 mmol/L 137  136  137   Potassium 3.5 - 5.1 mmol/L 3.5  3.8  3.8   Chloride 98 - 111 mmol/L 106  103  104   CO2 22 - 32 mmol/L 24  25  26    Calcium 8.9 - 10.3 mg/dL 8.8  9.3  8.8   Total Protein 6.5 - 8.1 g/dL 7.5  7.3  7.0   Total Bilirubin 0.3 - 1.2 mg/dL 0.5  0.5  0.7   Alkaline Phos 38 - 126 U/L 42  52  50   AST 15 - 41 U/L 17  20  18    ALT 0 - 44 U/L 15  20  16       ASSESSMENT & PLAN:   Normocytic anemia Anemia, hemoglobin decreased Labs reviewed and discussed with patient.  Lab Results  Component Value Date   HGB 11.1 (L) 09/23/2023   TIBC 321 09/23/2023   IRONPCTSAT 16 09/23/2023   FERRITIN 51 09/23/2023     Continue oral iron supplementation. continue sublingual B12 1000 mcg 3 time per week.  Follow up in 6 months.   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  Will monitor iron panel, B12, folate periodically.   Orders Placed This Encounter  Procedures   CBC with Differential (Cancer Center Only)    Standing Status:   Future    Standing Expiration Date:   09/28/2024   Iron and TIBC    Standing Status:   Future    Standing Expiration Date:   09/28/2024   Ferritin    Standing Status:   Future    Standing Expiration Date:   09/28/2024   Retic Panel    Standing Status:   Future    Standing Expiration Date:   09/28/2024   Vitamin B12    Standing Status:   Future    Standing Expiration Date:   09/28/2024   Folate    Standing Status:   Future    Standing Expiration Date:   09/28/2024   Follow up in 6 months  She prefers virtual MD visits for future appointments.    Rickard Patience, MD, PhD [facility] Hematology Oncology 09/29/2023

## 2023-09-30 ENCOUNTER — Other Ambulatory Visit (HOSPITAL_COMMUNITY): Payer: Self-pay

## 2023-10-01 DIAGNOSIS — F411 Generalized anxiety disorder: Secondary | ICD-10-CM | POA: Diagnosis not present

## 2023-10-15 DIAGNOSIS — F411 Generalized anxiety disorder: Secondary | ICD-10-CM | POA: Diagnosis not present

## 2023-10-20 NOTE — Addendum Note (Signed)
Addended by: Philipp Deputy A on: 10/20/2023 04:17 PM   Modules accepted: Orders

## 2023-10-29 DIAGNOSIS — F411 Generalized anxiety disorder: Secondary | ICD-10-CM | POA: Diagnosis not present

## 2023-11-01 ENCOUNTER — Other Ambulatory Visit (HOSPITAL_COMMUNITY): Payer: Self-pay

## 2023-11-01 ENCOUNTER — Other Ambulatory Visit: Payer: Self-pay

## 2023-11-01 ENCOUNTER — Telehealth: Payer: BC Managed Care – PPO | Admitting: Family Medicine

## 2023-11-01 DIAGNOSIS — H00015 Hordeolum externum left lower eyelid: Secondary | ICD-10-CM

## 2023-11-01 MED ORDER — NEOMYCIN-POLYMYXIN-HC 3.5-10000-1 OP SUSP
3.0000 [drp] | Freq: Four times a day (QID) | OPHTHALMIC | 0 refills | Status: AC
  Filled 2023-11-01: qty 7.5, 7d supply, fill #0

## 2023-11-01 NOTE — Progress Notes (Signed)
  E-Visit for Stye   We are sorry that you are not feeling well. Here is how we plan to help!  Based on what you have shared with me it looks like you have a stye.  A stye is an inflammation of the eyelid.  It is often a red, painful lump near the edge of the eyelid that may look like a boil or a pimple.  A stye develops when an infection occurs at the base of an eyelash.   We have made appropriate suggestions for you based upon your presentation: Your symptoms may indicate an infection of the sclera.  The use of anti-inflammatory and antibiotic eye drops for a week will help resolve this condition.  I have sent in neomycin-polymyxin HC opthalmic suspension, two to three drops in the affected eye every 4 hours.  If your symptoms do not improve over the next two to three days you should be seen in your doctor's office.  HOME CARE:  Wash your hands often! Let the stye open on its own. Don't squeeze or open it. Don't rub your eyes. This can irritate your eyes and let in bacteria.  If you need to touch your eyes, wash your hands first. Don't wear eye makeup or contact lenses until the area has healed.  GET HELP RIGHT AWAY IF:  Your symptoms do not improve. You develop blurred or loss of vision. Your symptoms worsen (increased discharge, pain or redness).   Thank you for choosing an e-visit.  Your e-visit answers were reviewed by a board certified advanced clinical practitioner to complete your personal care plan. Depending upon the condition, your plan could have included both over the counter or prescription medications.  Please review your pharmacy choice. Make sure the pharmacy is open so you can pick up prescription now. If there is a problem, you may contact your provider through [employer] and have the prescription routed to another pharmacy.  Your safety is important to [place]. If you have drug allergies check your prescription carefully.   For the next 24 hours you can use  MyChart to ask questions about today's visit, request a non-urgent call back, or ask for a work or school excuse. You will get an email in the next two days asking about your experience. I hope that your e-visit has been valuable and will speed your recovery.    have provided 5 minutes of non face to face time during this encounter for chart review and documentation.

## 2023-11-03 ENCOUNTER — Other Ambulatory Visit (HOSPITAL_COMMUNITY): Payer: Self-pay

## 2023-11-03 ENCOUNTER — Other Ambulatory Visit: Payer: Self-pay

## 2023-11-03 ENCOUNTER — Encounter: Payer: Self-pay | Admitting: Family Medicine

## 2023-11-04 ENCOUNTER — Ambulatory Visit (INDEPENDENT_AMBULATORY_CARE_PROVIDER_SITE_OTHER): Payer: BC Managed Care – PPO | Admitting: Family Medicine

## 2023-11-04 ENCOUNTER — Encounter: Payer: Self-pay | Admitting: Family Medicine

## 2023-11-04 ENCOUNTER — Other Ambulatory Visit (HOSPITAL_COMMUNITY): Payer: Self-pay

## 2023-11-04 VITALS — BP 120/70 | HR 79 | Resp 12 | Ht 66.0 in | Wt 179.1 lb

## 2023-11-04 DIAGNOSIS — H00015 Hordeolum externum left lower eyelid: Secondary | ICD-10-CM

## 2023-11-04 MED ORDER — ERYTHROMYCIN 5 MG/GM OP OINT
1.0000 | TOPICAL_OINTMENT | Freq: Three times a day (TID) | OPHTHALMIC | 0 refills | Status: AC
  Filled 2023-11-04: qty 3.5, 1d supply, fill #0
  Filled 2023-12-10: qty 3.5, 1d supply, fill #1

## 2023-11-04 NOTE — Patient Instructions (Addendum)
A few things to remember from today's visit:  Hordeolum externum of left lower eyelid - Plan: erythromycin ophthalmic ointment Local dry heat as instructed. Topical oint antibiotic 2-3 times per day and at bedtime. Do not use My Chart to request refills or for acute issues that need immediate attention. If you send a my chart message, it may take a few days to be addressed, specially if I am not in the office.  Please be sure medication list is accurate. If a new problem present, please set up appointment sooner than planned today.

## 2023-11-04 NOTE — Progress Notes (Addendum)
ACUTE VISIT Chief Complaint  Patient presents with   Stye    Left eye, lower eye lid   HPI: [name] is a 47 y.o. female with a PMHx significant for HTN, DM II, thrombocytosis, anxiety, depression, HLD, B12 deficiency, and vitamin D deficiency, among others, who is here today complaining of a lesion on left lower eyelid as described above.  Patient complains of an erythematous and edematous area on the lower eyelid of her left eye for 1-2 weeks. She says it has been growing, but is smaller today than yesterday. Endorses some cheek bone pain and swelling.  "Little" painful. This is a new problem for her.  She has been taking motrin, and tried local heat over the weekend.  She denies fever, chills, sore throat, oral lesions, trauma, or swallowing difficulty.   She has an eye doctor she sees regularly.   E-visit on 11/01/23 for this problem. She was recommended Cortisporin eye drops, has applied 3 gtt today.  Review of Systems  Constitutional:  Negative for activity change and appetite change.  HENT:  Negative for nosebleeds and sinus pain.   Eyes:  Negative for photophobia, redness and visual disturbance.  Respiratory:  Negative for cough and shortness of breath.   Gastrointestinal:  Negative for abdominal pain, nausea and vomiting.  Neurological:  Negative for syncope, facial asymmetry, weakness, numbness and headaches.  Psychiatric/Behavioral:  Negative for confusion and hallucinations.   See other pertinent positives and negatives in HPI.  Current Outpatient Medications on File Prior to Visit  Medication Sig Dispense Refill   BAYER MICROLET LANCETS lancets Use to test blood sugar 2 time daily as instructed. 100 each 5   benzonatate (TESSALON) 100 MG capsule Take 1 capsule (100 mg total) by mouth 3 (three) times daily as needed. (Patient not taking: Reported on 09/29/2023) 21 capsule 0   Blood Glucose Monitoring Suppl (ONETOUCH VERIO REFLECT) w/Device KIT Test blood  sugar in the morning, at noon, and at bedtime. 1 kit 0   Cyanocobalamin (B-12) 1000 MCG SUBL Place 1,000 mcg under the tongue See admin instructions. Take 3 time per week 90 tablet 1   fluticasone (FLONASE) 50 MCG/ACT nasal spray Place 2 sprays into both nostrils daily. 16 g 0   furosemide (LASIX) 20 MG tablet Take 1 tablet (20 mg total) by mouth daily as needed for lower extremity edema 90 tablet 1   glipiZIDE (GLUCOTROL) 5 MG tablet Take 1 tablet (5 mg total) by mouth 2 (two) times daily before a meal. 180 tablet 1   Glucose Blood (BLOOD GLUCOSE TEST STRIPS) STRP Test blood sugar in the morning, at noon, and at bedtime. 100 strip 11   hydrOXYzine (ATARAX/VISTARIL) 25 MG tablet Take 1 tablet (25 mg total) by mouth at bedtime as needed for anxiety. 30 tablet 3   Insulin Pen Needle (PEN NEEDLES) 32G X 5 MM MISC For daily use. 100 each 2   ipratropium (ATROVENT) 0.03 % nasal spray Place 2 sprays into both nostrils every 12 (twelve) hours. 30 mL 12   Iron-Vitamin C 65-125 MG TABS Take 1 tablet by mouth daily. 30 tablet 2   Lancets (ONETOUCH DELICA PLUS LANCET33G) MISC Test blood sugar in the morning, at noon, and at bedtime. 100 each 11   lisinopril-hydrochlorothiazide (ZESTORETIC) 20-25 MG tablet Take 1 tablet by mouth once daily 90 tablet 1   lisinopril-hydrochlorothiazide (ZESTORETIC) 20-25 MG tablet Take 1 tablet by mouth daily. (Patient not taking: Reported on 09/29/2023) 90 tablet 1  meloxicam (MOBIC) 7.5 MG tablet Take 1 tablet by mouth once daily 30 tablet 0   neomycin-polymyxin-hydrocortisone (CORTISPORIN) 3.5-10000-1 ophthalmic suspension Place 3 drops into the left eye 4 (four) times daily. 7.5 mL 0   pantoprazole (PROTONIX) 20 MG tablet Take 1 tablet (20 mg total) by mouth daily. 90 tablet 1   potassium chloride SA (KLOR-CON M) 20 MEQ tablet Take 1 tablet (20 mEq total) by mouth daily. 90 tablet 0   senna-docusate (SENOKOT-S) 8.6-50 MG tablet Take 1 tablet by mouth daily. 30 tablet 1    tirzepatide (MOUNJARO) 15 MG/0.5ML Pen Inject 15 mg into the skin once a week. 6 mL 1   No current facility-administered medications on file prior to visit.   Past Medical History:  Diagnosis Date   Chest pain    a. 09/2015 Ex MV: EF >55%, no ischemia/infarct.   Diabetes mellitus without complication (HCC)    a. 11/20/2015 A1c 11.5;  b.  noncompliant w/ diabetes meds, glucose checks, f/u, etc.   Dyspnea on exertion    a. 09/2015 Echo: Ef 60-65%, no rwma, mild AI, mildly dil LA.   Essential hypertension    Hyperlipidemia    Morbid obesity (HCC)    Mucinous cystadenoma of ovary    Hx of    Noncompliance    Allergies  Allergen Reactions   Benzoyl Peroxide Other (See Comments)    Burns    Social History   Socioeconomic History   Marital status: Single    Spouse name: Not on file   Number of children: 0   Years of education: Not on file   Highest education level: Professional school degree (e.g., MD, DDS, DVM, JD)  Occupational History    Employer: [employer]  Tobacco Use   Smoking status: Never   Smokeless tobacco: Never  Vaping Use   Vaping status: Never Used  Substance and Sexual Activity   Alcohol use: No   Drug use: No   Sexual activity: Yes    Partners: Male  Other Topics Concern   Not on file  Social History Narrative   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 Avg. Glucose: 298      Regular exercise: no   Caffeine use: occassionally                  Social Determinants of Health   Financial Resource Strain: Low Risk  (09/23/2023)   Overall Financial Resource Strain (CARDIA)    Difficulty of Paying Living Expenses: Not hard at all  Food Insecurity: No Food Insecurity (09/23/2023)   Hunger Vital Sign    Worried About Running Out of Food in the Last Year: Never true    Ran Out of Food in the Last Year:  Never true  Transportation Needs: No Transportation Needs (09/23/2023)   PRAPARE - Administrator, Civil Service (Medical): No    Lack of Transportation (Non-Medical): No  Physical Activity: Unknown (09/23/2023)   Exercise Vital Sign    Days of Exercise per Week: 0 days    Minutes of Exercise per Session: Not on file  Stress: Stress Concern Present (09/23/2023)   [employer] of Occupational Health - Occupational Stress Questionnaire    Feeling of Stress : To some extent  Social Connections: Socially Integrated (09/23/2023)  Social Advertising account executive [NHANES]    Frequency of Communication with Friends and Family: More than three times a week    Frequency of Social Gatherings with Friends and Family: More than three times a week    Attends Religious Services: More than 4 times per year    Active Member of Clubs or Organizations: Yes    Attends Banker Meetings: More than 4 times per year    Marital Status: Living with partner    Vitals:   11/04/23 1507  BP: 120/70  Pulse: 79  Resp: 12   Body mass index is 28.91 kg/m.  Physical Exam Vitals and nursing note reviewed.  Constitutional:      General: She is not in acute distress.    Appearance: She is well-developed.  HENT:     Head: Normocephalic and atraumatic.     Mouth/Throat:     Mouth: Mucous membranes are moist.     Pharynx: Oropharynx is clear.  Eyes:     Extraocular Movements: Extraocular movements intact.     Conjunctiva/sclera: Conjunctivae normal.     Pupils: Pupils are equal, round, and reactive to light.      Comments: See picture.  Cardiovascular:     Rate and Rhythm: Normal rate and regular rhythm.     Heart sounds: No murmur heard. Pulmonary:     Effort: Pulmonary effort is normal. No respiratory distress.     Breath sounds: Normal breath sounds.  Lymphadenopathy:     Head:     Right side of head: No preauricular adenopathy.     Left side of head: No  preauricular adenopathy.     Cervical: No cervical adenopathy.  Skin:    General: Skin is warm.     Findings: No erythema or rash.  Neurological:     General: No focal deficit present.     Mental Status: She is alert and oriented to person, place, and time.     Cranial Nerves: No cranial nerve deficit.     Gait: Gait normal.  Psychiatric:        Mood and Affect: Mood and affect normal.     ASSESSMENT AND PLAN:  Brenda Acosta was seen today for a stye on her eye.   Hordeolum externum of left lower eyelid -     Erythromycin; Place a thin ribbon of ointment into the left eye 3 (three) times daily for 7 days.  Dispense: 21 g; Refill: 0  We discussed Dx,prognosis,and treatment options. She is reporting great improvement since yesterday and after some draining overnight. Instructed not to manipulate lesion. Local dry heat. I think abx ointment is preferable to drops, she agrees with changes Rx to topical erythromycin oint tid. She was clearly instructed about warning signs. Explained that it may take sometimes for lesion to completely resolved. May need appt with ophthalmologist if not greatly improved in 2 weeks.  I spent a total of 30 minutes in both face to face and non face to face activities for this visit on the date of this encounter. During this time history was obtained and documented, examination was performed, and assessment/plan discussed. Visit interrupted intermittently because she was on a video work conference during visit.  Return in about 2 weeks (around 11/18/2023), or if symptoms worsen or fail to improve.  I, Suanne Marker, acting as a scribe for Liliann File [place], MD., have documented all relevant documentation on the behalf of Brenda Caspers [place], MD, as directed by  Kathie Rhodes  [place], MD while in the presence of Brenda Helbert [place], MD.   I, Marshon Bangs [place], MD, have reviewed all documentation for this visit. The documentation on 11/04/23 for the exam, diagnosis, procedures, and orders  are all accurate and complete.  Brenda Godette G. [place], MD  [facility]. [place] office.

## 2023-11-05 ENCOUNTER — Telehealth: Payer: BC Managed Care – PPO | Admitting: Family Medicine

## 2023-11-07 ENCOUNTER — Encounter: Payer: Self-pay | Admitting: Family Medicine

## 2023-11-07 DIAGNOSIS — Z0279 Encounter for issue of other medical certificate: Secondary | ICD-10-CM

## 2023-11-12 DIAGNOSIS — F411 Generalized anxiety disorder: Secondary | ICD-10-CM | POA: Diagnosis not present

## 2023-12-04 ENCOUNTER — Other Ambulatory Visit: Payer: Self-pay | Admitting: Family Medicine

## 2023-12-04 ENCOUNTER — Other Ambulatory Visit (HOSPITAL_COMMUNITY): Payer: Self-pay

## 2023-12-04 MED ORDER — LISINOPRIL-HYDROCHLOROTHIAZIDE 20-25 MG PO TABS
1.0000 | ORAL_TABLET | Freq: Every day | ORAL | 1 refills | Status: DC
  Filled 2023-12-04: qty 90, 90d supply, fill #0
  Filled 2024-03-23: qty 90, 90d supply, fill #1

## 2023-12-09 ENCOUNTER — Encounter: Payer: Self-pay | Admitting: Family Medicine

## 2023-12-09 ENCOUNTER — Other Ambulatory Visit (HOSPITAL_COMMUNITY): Payer: Self-pay

## 2023-12-09 ENCOUNTER — Other Ambulatory Visit (HOSPITAL_BASED_OUTPATIENT_CLINIC_OR_DEPARTMENT_OTHER): Payer: Self-pay

## 2023-12-09 DIAGNOSIS — L700 Acne vulgaris: Secondary | ICD-10-CM | POA: Diagnosis not present

## 2023-12-09 DIAGNOSIS — L300 Nummular dermatitis: Secondary | ICD-10-CM | POA: Diagnosis not present

## 2023-12-09 MED ORDER — SPIRONOLACTONE 100 MG PO TABS
100.0000 mg | ORAL_TABLET | Freq: Every day | ORAL | 3 refills | Status: DC
  Filled 2023-12-09: qty 90, 90d supply, fill #0
  Filled 2024-03-23: qty 90, 90d supply, fill #1

## 2023-12-09 MED ORDER — TAZAROTENE 0.05 % EX CREA
TOPICAL_CREAM | CUTANEOUS | 3 refills | Status: AC
  Filled 2023-12-09: qty 60, 30d supply, fill #0
  Filled 2024-03-04: qty 30, 15d supply, fill #0

## 2023-12-10 ENCOUNTER — Other Ambulatory Visit (HOSPITAL_COMMUNITY): Payer: Self-pay

## 2023-12-10 ENCOUNTER — Encounter: Payer: Self-pay | Admitting: Family Medicine

## 2023-12-10 DIAGNOSIS — R7303 Prediabetes: Secondary | ICD-10-CM | POA: Diagnosis not present

## 2023-12-10 DIAGNOSIS — F411 Generalized anxiety disorder: Secondary | ICD-10-CM | POA: Diagnosis not present

## 2023-12-10 NOTE — Telephone Encounter (Signed)
 Vitamin C , elderberry, frequent handwashing, making sure you are up-to-date on immunizations, and/or a regular multivitamin can help boost immune system.  While the spironolactone  can help with acne and other skin issues it is also used as a blood pressure medication.  Given a dose of the spironolactone  (100 mg daily), I would suggest stopping the lisinopril -hydrochlorothiazide .  The spironolactone  will likely take a little while to get in your system so you may notice an elevation in blood pressure.  For BP readings consistently greater than 140/90 you can take half a tablet of the lisinopril -hydrochlorothiazide  until starts working, then you could stop it.

## 2023-12-16 ENCOUNTER — Encounter: Payer: Self-pay | Admitting: Family Medicine

## 2023-12-31 DIAGNOSIS — R7303 Prediabetes: Secondary | ICD-10-CM | POA: Diagnosis not present

## 2024-01-08 DIAGNOSIS — F411 Generalized anxiety disorder: Secondary | ICD-10-CM | POA: Diagnosis not present

## 2024-01-14 ENCOUNTER — Encounter: Payer: Self-pay | Admitting: Family Medicine

## 2024-01-14 ENCOUNTER — Ambulatory Visit: Payer: BC Managed Care – PPO | Admitting: Family Medicine

## 2024-01-14 ENCOUNTER — Other Ambulatory Visit (HOSPITAL_COMMUNITY): Payer: Self-pay

## 2024-01-14 VITALS — BP 98/68 | HR 79 | Temp 98.9°F | Ht 66.0 in | Wt 180.4 lb

## 2024-01-14 DIAGNOSIS — I10 Essential (primary) hypertension: Secondary | ICD-10-CM | POA: Diagnosis not present

## 2024-01-14 DIAGNOSIS — Z6835 Body mass index (BMI) 35.0-35.9, adult: Secondary | ICD-10-CM

## 2024-01-14 DIAGNOSIS — E1139 Type 2 diabetes mellitus with other diabetic ophthalmic complication: Secondary | ICD-10-CM

## 2024-01-14 DIAGNOSIS — E66812 Obesity, class 2: Secondary | ICD-10-CM | POA: Diagnosis not present

## 2024-01-14 DIAGNOSIS — L0291 Cutaneous abscess, unspecified: Secondary | ICD-10-CM

## 2024-01-14 DIAGNOSIS — Z7985 Long-term (current) use of injectable non-insulin antidiabetic drugs: Secondary | ICD-10-CM

## 2024-01-14 LAB — POCT GLYCOSYLATED HEMOGLOBIN (HGB A1C): Hemoglobin A1C: 5.5 % (ref 4.0–5.6)

## 2024-01-14 MED ORDER — TIRZEPATIDE 15 MG/0.5ML ~~LOC~~ SOAJ
15.0000 mg | SUBCUTANEOUS | 3 refills | Status: DC
  Filled 2024-01-14 – 2024-03-01 (×2): qty 6, 84d supply, fill #0
  Filled 2024-06-14: qty 2, 28d supply, fill #1

## 2024-01-14 MED ORDER — AMOXICILLIN-POT CLAVULANATE 500-125 MG PO TABS
1.0000 | ORAL_TABLET | Freq: Two times a day (BID) | ORAL | 0 refills | Status: AC
  Filled 2024-01-14: qty 14, 7d supply, fill #0

## 2024-01-14 NOTE — Progress Notes (Unsigned)
Established Patient Office Visit   Subjective  Patient ID: Brenda Acosta, female    DOB: 1976-10-27  Age: 48 y.o. MRN: [id number]  Chief Complaint  Patient presents with  . Breast Pain    Left breast lump and pain     HPI  Patient Active Problem List   Diagnosis Date Noted  . Normocytic anemia 03/19/2023  . Hemorrhagic shock (HCC) 07/03/2022  . History of sleeve gastrectomy 11/15/2021  . IUD (intrauterine device) in place 11/15/2021  . Vitamin B12 deficiency 11/15/2021  . Depression, major, single episode, moderate (HCC) 06/25/2021  . Anxiety 06/25/2021  . Thrombocytosis 11/12/2020  . BMI 28.0-28.9,adult 08/12/2018  . Status post bariatric surgery 04/08/2018  . Vitamin D deficiency 06/24/2017  . Steatosis of liver 06/03/2017  . Dyspnea on exertion   . Chest pain   . Noncompliance   . Morbid obesity (HCC)   . Hyperlipidemia   . Diabetes mellitus without complication (HCC)   . Chest pain on exertion 08/29/2015  . Bilateral leg edema 08/29/2015  . Type 2 diabetes mellitus with ophthalmic complication (HCC) 06/10/2013  . Essential hypertension 06/10/2013  . Other and unspecified hyperlipidemia 06/10/2013  . Mucinous cystadenoma of ovary 06/10/2013   Past Medical History:  Diagnosis Date  . Chest pain    a. 09/2015 Ex MV: EF >55%, no ischemia/infarct.  . Diabetes mellitus without complication (HCC)    a. 11/20/2015 A1c 11.5;  b.  noncompliant w/ diabetes meds, glucose checks, f/u, etc.  . Dyspnea on exertion    a. 09/2015 Echo: Ef 60-65%, no rwma, mild AI, mildly dil LA.  . Essential hypertension   . Hyperlipidemia   . Morbid obesity (HCC)   . Mucinous cystadenoma of ovary    Hx of   . Noncompliance    Past Surgical History:  Procedure Laterality Date  . ABDOMINAL HYSTERECTOMY     partial hyst  . coloscopy    . LAPAROSCOPIC GASTRIC SLEEVE RESECTION  01/08/2018  . MOUTH SURGERY    . OOPHORECTOMY  07/02/2006   Rt; large benign ovarian tumor   Social  History   Tobacco Use  . Smoking status: Never  . Smokeless tobacco: Never  Vaping Use  . Vaping status: Never Used  Substance Use Topics  . Alcohol use: No  . Drug use: No   Family History  Problem Relation Age of Onset  . Heart disease Mother 40       heart attack  . Hypertension Father   . Diabetes Father        Type II  . Cancer Maternal Grandfather        prostate cancer  . Colon cancer Neg Hx   . Rectal cancer Neg Hx   . Stomach cancer Neg Hx    Allergies  Allergen Reactions  . Benzoyl Peroxide Other (See Comments)    Burns       ROS Negative unless stated above    Objective:     BP 98/68 (BP Location: Left Arm, Patient Position: Sitting, Cuff Size: Normal)   Pulse 79   Temp 98.9 F (37.2 C) (Oral)   Ht 5\' 6"  (1.676 m)   Wt 180 lb 6.4 oz (81.8 kg)   LMP  (LMP Unknown)   BMI 29.12 kg/m  BP Readings from Last 3 Encounters:  01/14/24 98/68  11/04/23 120/70  09/24/23 98/68   Wt Readings from Last 3 Encounters:  01/14/24 180 lb 6.4 oz (81.8 kg)  11/04/23 179 lb  2 oz (81.3 kg)  09/24/23 188 lb 6.4 oz (85.5 kg)      Physical Exam   No results found for any visits on 01/14/24.    Assessment & Plan:  Type 2 diabetes mellitus with other ophthalmic complication, without long-term current use of insulin (HCC)    No follow-ups on file.   Deeann Saint, MD

## 2024-01-19 ENCOUNTER — Other Ambulatory Visit (HOSPITAL_COMMUNITY): Payer: Self-pay

## 2024-01-20 ENCOUNTER — Other Ambulatory Visit (HOSPITAL_COMMUNITY): Payer: Self-pay

## 2024-01-20 ENCOUNTER — Telehealth: Payer: BC Managed Care – PPO | Admitting: Physician Assistant

## 2024-01-20 DIAGNOSIS — R6889 Other general symptoms and signs: Secondary | ICD-10-CM

## 2024-01-20 DIAGNOSIS — Z20828 Contact with and (suspected) exposure to other viral communicable diseases: Secondary | ICD-10-CM | POA: Diagnosis not present

## 2024-01-20 MED ORDER — OSELTAMIVIR PHOSPHATE 75 MG PO CAPS
75.0000 mg | ORAL_CAPSULE | Freq: Two times a day (BID) | ORAL | 0 refills | Status: DC
  Filled 2024-01-20: qty 10, 5d supply, fill #0

## 2024-01-20 NOTE — Progress Notes (Signed)
 I have spent 5 minutes in review of e-visit questionnaire, review and updating patient chart, medical decision making and response to patient.   Piedad Climes, PA-C

## 2024-01-20 NOTE — Progress Notes (Signed)
E visit for Flu like symptoms   We are sorry that you are not feeling well.  Here is how we plan to help! Based on what you have shared with me it looks like you may have possible exposure to a virus that causes influenza.  Influenza or "the flu" is   an infection caused by a respiratory virus. The flu virus is highly contagious and persons who did not receive their yearly flu vaccination may "catch" the flu from close contact.  We have anti-viral medications to treat the viruses that cause this infection. They are not a "cure" and only shorten the course of the infection. These prescriptions are most effective when they are given within the first 2 days of "flu" symptoms. Antiviral medication are indicated if you have a high risk of complications from the flu. You should  also consider an antiviral medication if you are in close contact with someone who is at risk. These medications can help patients avoid complications from the flu  but have side effects that you should know. Possible side effects from Tamiflu or oseltamivir include nausea, vomiting, diarrhea, dizziness, headaches, eye redness, sleep problems or other respiratory symptoms. You should not take Tamiflu if you have an allergy to oseltamivir or any to the ingredients in Tamiflu.  Based upon your symptoms and potential risk factors I have prescribed Oseltamivir (Tamiflu).  It has been sent to your designated pharmacy.  You will take one 75 mg capsule orally twice a day for the next 5 days. and I recommend that you follow the flu symptoms recommendation that I have listed below.  ANYONE WHO HAS FLU SYMPTOMS SHOULD: Stay home. The flu is highly contagious and going out or to work exposes others! Be sure to drink plenty of fluids. Water is fine as well as fruit juices, sodas and electrolyte beverages. You may want to stay away from caffeine or alcohol. If you are nauseated, try taking small sips of liquids. How do you know if you are getting  enough fluid? Your urine should be a pale yellow or almost colorless. Get rest. Taking a steamy shower or using a humidifier may help nasal congestion and ease sore throat pain. Using a saline nasal spray works much the same way. Cough drops, hard candies and sore throat lozenges may ease your cough. Line up a caregiver. Have someone check on you regularly.   GET HELP RIGHT AWAY IF: You cannot keep down liquids or your medications. You become short of breath Your fell like you are going to pass out or loose consciousness. Your symptoms persist after you have completed your treatment plan MAKE SURE YOU  Understand these instructions. Will watch your condition. Will get help right away if you are not doing well or get worse.  Your e-visit answers were reviewed by a board certified advanced clinical practitioner to complete your personal care plan.  Depending on the condition, your plan could have included both over the counter or prescription medications.  If there is a problem please reply  once you have received a response from your provider.  Your safety is important to [place].  If you have drug allergies check your prescription carefully.    You can use MyChart to ask questions about today's visit, request a non-urgent call back, or ask for a work or school excuse for 24 hours related to this e-Visit. If it has been greater than 24 hours you will need to follow up with your provider, or enter  a new e-Visit to address those concerns.  You will get an e-mail in the next two days asking about your experience.  I hope that your e-visit has been valuable and will speed your recovery. Thank you for using e-visits.

## 2024-01-21 DIAGNOSIS — F411 Generalized anxiety disorder: Secondary | ICD-10-CM | POA: Diagnosis not present

## 2024-02-10 ENCOUNTER — Encounter: Payer: Self-pay | Admitting: Family Medicine

## 2024-02-13 ENCOUNTER — Other Ambulatory Visit (HOSPITAL_COMMUNITY): Payer: Self-pay

## 2024-02-23 NOTE — Telephone Encounter (Signed)
 Yes, please set up follow-up appointment for this.

## 2024-03-01 ENCOUNTER — Other Ambulatory Visit (HOSPITAL_COMMUNITY): Payer: Self-pay

## 2024-03-01 ENCOUNTER — Other Ambulatory Visit: Payer: Self-pay

## 2024-03-04 ENCOUNTER — Other Ambulatory Visit (HOSPITAL_COMMUNITY): Payer: Self-pay

## 2024-03-05 ENCOUNTER — Other Ambulatory Visit (HOSPITAL_COMMUNITY): Payer: Self-pay

## 2024-03-23 ENCOUNTER — Other Ambulatory Visit: Payer: Self-pay

## 2024-03-23 ENCOUNTER — Other Ambulatory Visit: Payer: Self-pay | Admitting: Family Medicine

## 2024-03-23 DIAGNOSIS — K219 Gastro-esophageal reflux disease without esophagitis: Secondary | ICD-10-CM

## 2024-03-25 ENCOUNTER — Other Ambulatory Visit (HOSPITAL_COMMUNITY): Payer: Self-pay

## 2024-03-25 MED ORDER — PANTOPRAZOLE SODIUM 20 MG PO TBEC
20.0000 mg | DELAYED_RELEASE_TABLET | Freq: Every day | ORAL | 1 refills | Status: DC
Start: 2024-03-25 — End: 2024-09-15
  Filled 2024-03-25: qty 90, 90d supply, fill #0

## 2024-03-29 ENCOUNTER — Other Ambulatory Visit (HOSPITAL_COMMUNITY): Payer: Self-pay

## 2024-03-30 ENCOUNTER — Inpatient Hospital Stay: Payer: BC Managed Care – PPO | Attending: Oncology

## 2024-03-30 DIAGNOSIS — Z9884 Bariatric surgery status: Secondary | ICD-10-CM | POA: Diagnosis not present

## 2024-03-30 DIAGNOSIS — D75839 Thrombocytosis, unspecified: Secondary | ICD-10-CM | POA: Insufficient documentation

## 2024-03-30 DIAGNOSIS — D509 Iron deficiency anemia, unspecified: Secondary | ICD-10-CM | POA: Insufficient documentation

## 2024-03-30 LAB — CBC WITH DIFFERENTIAL (CANCER CENTER ONLY)
Abs Immature Granulocytes: 0.02 10*3/uL (ref 0.00–0.07)
Basophils Absolute: 0.1 10*3/uL (ref 0.0–0.1)
Basophils Relative: 1 %
Eosinophils Absolute: 0.3 10*3/uL (ref 0.0–0.5)
Eosinophils Relative: 5 %
HCT: 32.2 % — ABNORMAL LOW (ref 36.0–46.0)
Hemoglobin: 10.6 g/dL — ABNORMAL LOW (ref 12.0–15.0)
Immature Granulocytes: 0 %
Lymphocytes Relative: 44 %
Lymphs Abs: 2.7 10*3/uL (ref 0.7–4.0)
MCH: 29.9 pg (ref 26.0–34.0)
MCHC: 32.9 g/dL (ref 30.0–36.0)
MCV: 91 fL (ref 80.0–100.0)
Monocytes Absolute: 0.3 10*3/uL (ref 0.1–1.0)
Monocytes Relative: 5 %
Neutro Abs: 2.9 10*3/uL (ref 1.7–7.7)
Neutrophils Relative %: 45 %
Platelet Count: 351 10*3/uL (ref 150–400)
RBC: 3.54 MIL/uL — ABNORMAL LOW (ref 3.87–5.11)
RDW: 11.8 % (ref 11.5–15.5)
WBC Count: 6.3 10*3/uL (ref 4.0–10.5)
nRBC: 0 % (ref 0.0–0.2)

## 2024-03-30 LAB — IRON AND TIBC
Iron: 63 ug/dL (ref 28–170)
Saturation Ratios: 22 % (ref 10.4–31.8)
TIBC: 288 ug/dL (ref 250–450)
UIBC: 225 ug/dL

## 2024-03-30 LAB — RETIC PANEL
Immature Retic Fract: 3.2 % (ref 2.3–15.9)
RBC.: 3.53 MIL/uL — ABNORMAL LOW (ref 3.87–5.11)
Retic Count, Absolute: 30.7 10*3/uL (ref 19.0–186.0)
Retic Ct Pct: 0.9 % (ref 0.4–3.1)
Reticulocyte Hemoglobin: 33 pg (ref >27.9)

## 2024-03-30 LAB — FERRITIN: Ferritin: 74 ng/mL (ref 11–307)

## 2024-03-30 LAB — FOLATE: Folate: 23 ng/mL (ref >5.9)

## 2024-03-30 LAB — VITAMIN B12: Vitamin B-12: 974 pg/mL — ABNORMAL HIGH (ref 180–914)

## 2024-04-01 ENCOUNTER — Other Ambulatory Visit (HOSPITAL_COMMUNITY): Payer: Self-pay

## 2024-04-05 ENCOUNTER — Inpatient Hospital Stay: Payer: BC Managed Care – PPO | Attending: Oncology | Admitting: Oncology

## 2024-04-05 ENCOUNTER — Encounter: Payer: Self-pay | Admitting: Oncology

## 2024-04-05 DIAGNOSIS — Z79620 Long term (current) use of immunosuppressive biologic: Secondary | ICD-10-CM | POA: Insufficient documentation

## 2024-04-05 DIAGNOSIS — Z01411 Encounter for gynecological examination (general) (routine) with abnormal findings: Secondary | ICD-10-CM | POA: Diagnosis not present

## 2024-04-05 DIAGNOSIS — Z8742 Personal history of other diseases of the female genital tract: Secondary | ICD-10-CM | POA: Diagnosis not present

## 2024-04-05 DIAGNOSIS — D75839 Thrombocytosis, unspecified: Secondary | ICD-10-CM | POA: Insufficient documentation

## 2024-04-05 DIAGNOSIS — Z9884 Bariatric surgery status: Secondary | ICD-10-CM | POA: Insufficient documentation

## 2024-04-05 DIAGNOSIS — D509 Iron deficiency anemia, unspecified: Secondary | ICD-10-CM | POA: Insufficient documentation

## 2024-04-05 DIAGNOSIS — Z1231 Encounter for screening mammogram for malignant neoplasm of breast: Secondary | ICD-10-CM | POA: Diagnosis not present

## 2024-04-05 DIAGNOSIS — Z903 Acquired absence of stomach [part of]: Secondary | ICD-10-CM | POA: Diagnosis not present

## 2024-04-05 DIAGNOSIS — N898 Other specified noninflammatory disorders of vagina: Secondary | ICD-10-CM | POA: Diagnosis not present

## 2024-04-05 DIAGNOSIS — D649 Anemia, unspecified: Secondary | ICD-10-CM

## 2024-04-05 DIAGNOSIS — Z79899 Other long term (current) drug therapy: Secondary | ICD-10-CM | POA: Insufficient documentation

## 2024-04-05 NOTE — Assessment & Plan Note (Signed)
 History of gastric sleeve resection.   monitor iron  panel, B12, folate periodically.

## 2024-04-05 NOTE — Assessment & Plan Note (Addendum)
 Anemia, hemoglobin decreased Labs reviewed and discussed with patient.  Lab Results  Component Value Date   HGB 10.6 (L) 03/30/2024   TIBC 288 03/30/2024   IRONPCTSAT 22 03/30/2024   FERRITIN 74 03/30/2024    Hemoglobin has decreased. She has normal iron  panel, normal B12 and folate.  Recommend additional work up check cmp, haptoglobin, SPEP, copper , LDH, Tsh, hemoglobinopathy evaluation.

## 2024-04-05 NOTE — Progress Notes (Signed)
 HEMATOLOGY-ONCOLOGY TeleHEALTH VISIT PROGRESS NOTE  I connected with Brenda Acosta on 04/05/24  at  2:45 PM EDT by video enabled telemedicine visit and verified that I am speaking with the correct person using two identifiers. I discussed the limitations, risks, security and privacy concerns of performing an evaluation and management service by telemedicine and the availability of in-person appointments. The patient expressed understanding and agreed to proceed.   Other persons participating in the visit and their role in the encounter:  None  Patient's location: at work Provider's location: office Chief Complaint: Thrombocytosis, iron  deficiency anemia, history of gastric sleeve   INTERVAL HISTORY Brenda Acosta is a 48 y.o. female who has above history reviewed by me today presents for follow up visit for thrombocytosis, iron  deficiency anemia, history of gastric sleeve She take oral iron  supplementation intermittently. Take multi vitamin that contains iron .  . + fatigue, attributes to recent busy work schedule.  No new complaints.   Past Medical History:  Diagnosis Date   Chest pain    a. 09/2015 Ex MV: EF >55%, no ischemia/infarct.   Diabetes mellitus without complication (HCC)    a. 11/20/2015 A1c 11.5;  b.  noncompliant w/ diabetes meds, glucose checks, f/u, etc.   Dyspnea on exertion    a. 09/2015 Echo: Ef 60-65%, no rwma, mild AI, mildly dil LA.   Essential hypertension    Hyperlipidemia    Morbid obesity (HCC)    Mucinous cystadenoma of ovary    Hx of    Noncompliance    Past Surgical History:  Procedure Laterality Date   ABDOMINAL HYSTERECTOMY     partial hyst   coloscopy     LAPAROSCOPIC GASTRIC SLEEVE RESECTION  01/08/2018   MOUTH SURGERY     OOPHORECTOMY  07/02/2006   Rt; large benign ovarian tumor    Family History  Problem Relation Acosta of Onset   Heart disease Mother 53       heart attack   Hypertension Father    Diabetes Father        Type II    Cancer Maternal Grandfather        prostate cancer   Colon cancer Neg Hx    Rectal cancer Neg Hx    Stomach cancer Neg Hx     Social History   Socioeconomic History   Marital status: Single    Spouse name: Not on file   Number of children: 0   Years of education: Not on file   Highest education level: Professional school degree (e.g., MD, DDS, DVM, JD)  Occupational History    Employer: [employer]  Tobacco Use   Smoking status: Never   Smokeless tobacco: Never  Vaping Use   Vaping status: Never Used  Substance and Sexual Activity   Alcohol use: No   Drug use: No   Sexual activity: Yes    Partners: Male  Other Topics Concern   Not on file  Social History Narrative   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 Avg. Glucose: 298      Regular exercise: no   Caffeine use: occassionally                  Social Drivers of Health   Financial Resource Strain: Low Risk  (01/12/2024)  Overall Financial Resource Strain (CARDIA)    Difficulty of Paying Living Expenses: Not very hard  Food Insecurity: No Food Insecurity (01/12/2024)   Hunger Vital Sign    Worried About Running Out of Food in the Last Year: Never true    Ran Out of Food in the Last Year: Never true  Transportation Needs: No Transportation Needs (01/12/2024)   PRAPARE - Administrator, Civil Service (Medical): No    Lack of Transportation (Non-Medical): No  Physical Activity: Insufficiently Active (01/12/2024)   Exercise Vital Sign    Days of Exercise per Week: 1 day    Minutes of Exercise per Session: 60 min  Stress: Stress Concern Present (01/12/2024)   [employer] of Occupational Health - Occupational Stress Questionnaire    Feeling of Stress : To some extent  Social Connections: Moderately Integrated (01/12/2024)   Social Connection and  Isolation Panel [NHANES]    Frequency of Communication with Friends and Family: More than three times a week    Frequency of Social Gatherings with Friends and Family: More than three times a week    Attends Religious Services: More than 4 times per year    Active Member of [employer] or Organizations: Yes    Attends Banker Meetings: 1 to 4 times per year    Marital Status: Never married  Intimate Partner Violence: Not on file    Current Outpatient Medications on File Prior to Visit  Medication Sig Dispense Refill   BAYER MICROLET LANCETS lancets Use to test blood sugar 2 time daily as instructed. 100 each 5   benzonatate  (TESSALON ) 100 MG capsule Take 1 capsule (100 mg total) by mouth 3 (three) times daily as needed. 21 capsule 0   Blood Glucose Monitoring Suppl (ONETOUCH VERIO REFLECT) w/Device KIT Test blood sugar in the morning, at noon, and at bedtime. 1 kit 0   Cyanocobalamin  (B-12) 1000 MCG SUBL Place 1,000 mcg under the tongue See admin instructions. Take 3 time per week 90 tablet 1   fluticasone  (FLONASE ) 50 MCG/ACT nasal spray Place 2 sprays into both nostrils daily. 16 g 0   furosemide  (LASIX ) 20 MG tablet Take 1 tablet (20 mg total) by mouth daily as needed for lower extremity edema 90 tablet 1   Glucose Blood (BLOOD GLUCOSE TEST STRIPS) STRP Test blood sugar in the morning, at noon, and at bedtime. 100 strip 11   hydrOXYzine  (ATARAX /VISTARIL ) 25 MG tablet Take 1 tablet (25 mg total) by mouth at bedtime as needed for anxiety. 30 tablet 3   Insulin Pen Needle (PEN NEEDLES) 32G X 5 MM MISC For daily use. 100 each 2   ipratropium (ATROVENT ) 0.03 % nasal spray Place 2 sprays into both nostrils every 12 (twelve) hours. 30 mL 12   Iron -Vitamin C  65-125 MG TABS Take 1 tablet by mouth daily. 30 tablet 2   Lancets (ONETOUCH DELICA PLUS LANCET33G) MISC Test blood sugar in the morning, at noon, and at bedtime. 100 each 11   lisinopril -hydrochlorothiazide  (ZESTORETIC ) 20-25 MG tablet  Take 1 tablet by mouth once daily 90 tablet 1   meloxicam  (MOBIC ) 7.5 MG tablet Take 1 tablet by mouth once daily 30 tablet 0   neomycin -polymyxin-hydrocortisone (CORTISPORIN ) 3.5-10000-1 ophthalmic suspension Place 3 drops into the left eye 4 (four) times daily. 7.5 mL 0   pantoprazole  (PROTONIX ) 20 MG tablet Take 1 tablet (20 mg total) by mouth daily. 90 tablet 1   potassium chloride  SA (KLOR-CON  M) 20 MEQ tablet  Take 1 tablet (20 mEq total) by mouth daily. 90 tablet 0   senna-docusate (SENOKOT-S) 8.6-50 MG tablet Take 1 tablet by mouth daily. 30 tablet 1   spironolactone  (ALDACTONE ) 100 MG tablet Take 1 tablet (100 mg total) by mouth daily. 90 tablet 3   tazarotene  (TAZORAC ) 0.05 % cream Apply topically every night at bedtime 60 g 3   tirzepatide  (MOUNJARO ) 15 MG/0.5ML Pen Inject 15 mg into the skin once a week. 6 mL 3   glipiZIDE  (GLUCOTROL ) 5 MG tablet Take 1 tablet (5 mg total) by mouth 2 (two) times daily before a meal. (Patient not taking: Reported on 04/05/2024) 180 tablet 1   lisinopril -hydrochlorothiazide  (ZESTORETIC ) 20-25 MG tablet Take 1 tablet by mouth daily. (Patient not taking: Reported on 04/05/2024) 90 tablet 1   oseltamivir  (TAMIFLU ) 75 MG capsule Take 1 capsule (75 mg total) by mouth 2 (two) times daily. (Patient not taking: Reported on 04/05/2024) 10 capsule 0   No current facility-administered medications on file prior to visit.    Allergies  Allergen Reactions   Benzoyl Peroxide Other (See Comments)    Burns     Review of Systems  Constitutional:  Positive for fatigue. Negative for appetite change, chills and fever.  HENT:   Negative for hearing loss.   Cardiovascular:  Negative for chest pain.  Genitourinary:  Negative for difficulty urinating.   Musculoskeletal:  Negative for arthralgias.  Skin:  Negative for itching and rash.  Neurological:  Negative for extremity weakness.  Hematological:  Negative for adenopathy.  Psychiatric/Behavioral:  Negative for confusion.        Observations/Objective: There were no vitals filed for this visit.  There is no height or weight on file to calculate BMI.  Physical Exam Neurological:     Mental Status: She is alert.     Labs    Latest Ref Rng & Units 03/30/2024    8:09 AM 09/23/2023    8:05 AM 06/18/2023   10:54 AM  CBC  WBC 4.0 - 10.5 K/uL 6.3  7.9  7.4   Hemoglobin 12.0 - 15.0 g/dL 43.3  29.5  18.8   Hematocrit 36.0 - 46.0 % 32.2  34.0  34.9   Platelets 150 - 400 K/uL 351  383  419       Latest Ref Rng & Units 06/18/2023   10:53 AM 03/20/2023   11:49 AM 05/09/2022    8:04 AM  CMP  Glucose 70 - 99 mg/dL 97  416  606   BUN 6 - 20 mg/dL 21  23  18    Creatinine 0.44 - 1.00 mg/dL 3.01  6.01  0.93   Sodium 135 - 145 mmol/L 137  136  137   Potassium 3.5 - 5.1 mmol/L 3.5  3.8  3.8   Chloride 98 - 111 mmol/L 106  103  104   CO2 22 - 32 mmol/L 24  25  26    Calcium 8.9 - 10.3 mg/dL 8.8  9.3  8.8   Total Protein 6.5 - 8.1 g/dL 7.5  7.3  7.0   Total Bilirubin 0.3 - 1.2 mg/dL 0.5  0.5  0.7   Alkaline Phos 38 - 126 U/L 42  52  50   AST 15 - 41 U/L 17  20  18    ALT 0 - 44 U/L 15  20  16       ASSESSMENT & PLAN:   Normocytic anemia Anemia, hemoglobin decreased Labs reviewed and discussed with patient.  Lab Results  Component Value Date   HGB 10.6 (L) 03/30/2024   TIBC 288 03/30/2024   IRONPCTSAT 22 03/30/2024   FERRITIN 74 03/30/2024    Hemoglobin has decreased. She has normal iron  panel, normal B12 and folate.  Recommend additional work up check cmp, haptoglobin, SPEP, copper , LDH, Tsh, hemoglobinopathy evaluation.   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   monitor iron  panel, B12, folate periodically.   Orders Placed This Encounter  Procedures   Comprehensive metabolic panel with GFR    Standing Status:   Future    Expected Date:   04/07/2024    Expiration Date:   04/05/2025   Lactate dehydrogenase    Standing Status:   Future    Expected Date:   04/07/2024     Expiration Date:   04/05/2025   Copper , serum    Standing Status:   Future    Expected Date:   04/07/2024    Expiration Date:   04/05/2025   Haptoglobin    Standing Status:   Future    Expected Date:   04/07/2024    Expiration Date:   04/05/2025   Multiple Myeloma Panel (SPEP&IFE w/QIG)    Standing Status:   Future    Expected Date:   04/07/2024    Expiration Date:   04/05/2025   Kappa/lambda light chains    Standing Status:   Future    Expected Date:   04/07/2024    Expiration Date:   04/05/2025   TSH    Standing Status:   Future    Expected Date:   04/07/2024    Expiration Date:   04/05/2025   Hgb Fractionation Cascade    Standing Status:   Future    Expected Date:   04/05/2024    Expiration Date:   04/05/2025   Follow upTBD She prefers virtual MD visits for future appointments.    Timmy Forbes, MD, PhD [facility] Hematology Oncology 04/05/2024

## 2024-04-08 ENCOUNTER — Inpatient Hospital Stay

## 2024-04-08 DIAGNOSIS — D649 Anemia, unspecified: Secondary | ICD-10-CM

## 2024-04-08 DIAGNOSIS — Z903 Acquired absence of stomach [part of]: Secondary | ICD-10-CM

## 2024-04-08 DIAGNOSIS — D75839 Thrombocytosis, unspecified: Secondary | ICD-10-CM | POA: Diagnosis not present

## 2024-04-08 DIAGNOSIS — Z79620 Long term (current) use of immunosuppressive biologic: Secondary | ICD-10-CM | POA: Diagnosis not present

## 2024-04-08 DIAGNOSIS — D509 Iron deficiency anemia, unspecified: Secondary | ICD-10-CM | POA: Diagnosis not present

## 2024-04-08 DIAGNOSIS — Z9884 Bariatric surgery status: Secondary | ICD-10-CM | POA: Diagnosis not present

## 2024-04-08 DIAGNOSIS — Z79899 Other long term (current) drug therapy: Secondary | ICD-10-CM | POA: Diagnosis not present

## 2024-04-08 LAB — LACTATE DEHYDROGENASE: LDH: 93 U/L — ABNORMAL LOW (ref 98–192)

## 2024-04-08 LAB — COMPREHENSIVE METABOLIC PANEL WITH GFR
ALT: 12 U/L (ref 0–44)
AST: 17 U/L (ref 15–41)
Albumin: 3.9 g/dL (ref 3.5–5.0)
Alkaline Phosphatase: 41 U/L (ref 38–126)
Anion gap: 12 (ref 5–15)
BUN: 18 mg/dL (ref 6–20)
CO2: 19 mmol/L — ABNORMAL LOW (ref 22–32)
Calcium: 9.1 mg/dL (ref 8.9–10.3)
Chloride: 106 mmol/L (ref 98–111)
Creatinine, Ser: 1.09 mg/dL — ABNORMAL HIGH (ref 0.44–1.00)
GFR, Estimated: >60 mL/min (ref >60)
Glucose, Bld: 80 mg/dL (ref 70–99)
Potassium: 3.7 mmol/L (ref 3.5–5.1)
Sodium: 137 mmol/L (ref 135–145)
Total Bilirubin: 0.8 mg/dL (ref 0.0–1.2)
Total Protein: 7.2 g/dL (ref 6.5–8.1)

## 2024-04-08 LAB — TSH: TSH: 0.97 u[IU]/mL (ref 0.350–4.500)

## 2024-04-09 LAB — COPPER, SERUM: Copper: 80 ug/dL (ref 80–158)

## 2024-04-09 LAB — KAPPA/LAMBDA LIGHT CHAINS
Kappa free light chain: 29.2 mg/L — ABNORMAL HIGH (ref 3.3–19.4)
Kappa, lambda light chain ratio: 1.38 (ref 0.26–1.65)
Lambda free light chains: 21.2 mg/L (ref 5.7–26.3)

## 2024-04-09 LAB — HAPTOGLOBIN: Haptoglobin: 130 mg/dL (ref 42–296)

## 2024-04-11 LAB — MULTIPLE MYELOMA PANEL, SERUM
Albumin SerPl Elph-Mcnc: 3.3 g/dL (ref 2.9–4.4)
Albumin/Glob SerPl: 1.2 (ref 0.7–1.7)
Alpha 1: 0.2 g/dL (ref 0.0–0.4)
Alpha2 Glob SerPl Elph-Mcnc: 0.7 g/dL (ref 0.4–1.0)
B-Globulin SerPl Elph-Mcnc: 0.9 g/dL (ref 0.7–1.3)
Gamma Glob SerPl Elph-Mcnc: 1.1 g/dL (ref 0.4–1.8)
Globulin, Total: 2.9 g/dL (ref 2.2–3.9)
IgA: 335 mg/dL (ref 87–352)
IgG (Immunoglobin G), Serum: 1149 mg/dL (ref 586–1602)
IgM (Immunoglobulin M), Srm: 112 mg/dL (ref 26–217)
Total Protein ELP: 6.2 g/dL (ref 6.0–8.5)

## 2024-04-14 LAB — HGB FRACTIONATION CASCADE
Hgb A2: 2.9 % (ref 1.8–3.2)
Hgb A: 97.1 % (ref 96.4–98.8)
Hgb F: 0 % (ref 0.0–2.0)
Hgb S: 0 %

## 2024-04-15 ENCOUNTER — Telehealth: Admitting: Physician Assistant

## 2024-04-15 ENCOUNTER — Other Ambulatory Visit (HOSPITAL_COMMUNITY): Payer: Self-pay

## 2024-04-15 DIAGNOSIS — B3731 Acute candidiasis of vulva and vagina: Secondary | ICD-10-CM

## 2024-04-15 MED ORDER — FLUCONAZOLE 150 MG PO TABS
150.0000 mg | ORAL_TABLET | Freq: Every day | ORAL | 0 refills | Status: AC
  Filled 2024-04-15: qty 1, 1d supply, fill #0

## 2024-04-15 NOTE — Progress Notes (Signed)

## 2024-04-15 NOTE — Progress Notes (Signed)
 I have spent 5 minutes in review of e-visit questionnaire, review and updating patient chart, medical decision making and response to patient.   Piedad Climes, PA-C

## 2024-04-26 ENCOUNTER — Ambulatory Visit: Payer: Self-pay | Admitting: Oncology

## 2024-04-26 DIAGNOSIS — D649 Anemia, unspecified: Secondary | ICD-10-CM

## 2024-04-27 NOTE — Telephone Encounter (Signed)
 Spoke to pt and informed her of results and MD recommendation.   Please schedule labs on 8/25- early morning and Virtual on 8/26. Pt will see appts on mychart.

## 2024-04-27 NOTE — Telephone Encounter (Signed)
-----   Message from Brenda Acosta sent at 04/26/2024  8:42 PM EDT ----- Please let patient know that her work up results are all normal, and does not explain why her red blood cell count is lower. I recommend short term follow up  Lab virtual or in person MD in 3 months. Please order cbc cmp retic panel tech smear. Thanks.

## 2024-06-08 ENCOUNTER — Other Ambulatory Visit (HOSPITAL_COMMUNITY): Payer: Self-pay

## 2024-06-08 DIAGNOSIS — L700 Acne vulgaris: Secondary | ICD-10-CM | POA: Diagnosis not present

## 2024-06-08 DIAGNOSIS — L300 Nummular dermatitis: Secondary | ICD-10-CM | POA: Diagnosis not present

## 2024-06-08 MED ORDER — TRETINOIN 0.1 % EX CREA
TOPICAL_CREAM | CUTANEOUS | 6 refills | Status: AC
  Filled 2024-06-08 – 2024-06-18 (×2): qty 45, 30d supply, fill #0

## 2024-06-08 MED ORDER — CLOBETASOL PROPIONATE 0.05 % EX SOLN
CUTANEOUS | 1 refills | Status: AC
  Filled 2024-06-08: qty 50, 30d supply, fill #0

## 2024-06-09 ENCOUNTER — Other Ambulatory Visit (HOSPITAL_COMMUNITY): Payer: Self-pay

## 2024-06-10 ENCOUNTER — Other Ambulatory Visit (HOSPITAL_COMMUNITY): Payer: Self-pay

## 2024-06-13 ENCOUNTER — Other Ambulatory Visit: Payer: Self-pay | Admitting: Family Medicine

## 2024-06-13 DIAGNOSIS — E1165 Type 2 diabetes mellitus with hyperglycemia: Secondary | ICD-10-CM

## 2024-06-14 ENCOUNTER — Encounter (HOSPITAL_COMMUNITY): Payer: Self-pay

## 2024-06-14 ENCOUNTER — Other Ambulatory Visit (HOSPITAL_COMMUNITY): Payer: Self-pay

## 2024-06-14 ENCOUNTER — Other Ambulatory Visit: Payer: Self-pay | Admitting: Family Medicine

## 2024-06-14 MED ORDER — LISINOPRIL-HYDROCHLOROTHIAZIDE 20-25 MG PO TABS
1.0000 | ORAL_TABLET | Freq: Every day | ORAL | 0 refills | Status: DC
  Filled 2024-06-14 (×2): qty 90, 90d supply, fill #0

## 2024-06-14 MED ORDER — BLOOD GLUCOSE TEST VI STRP
1.0000 | ORAL_STRIP | Freq: Three times a day (TID) | 0 refills | Status: DC
  Filled 2024-06-14 – 2024-06-18 (×2): qty 100, 34d supply, fill #0

## 2024-06-15 ENCOUNTER — Encounter (HOSPITAL_COMMUNITY): Payer: Self-pay

## 2024-06-15 ENCOUNTER — Other Ambulatory Visit (HOSPITAL_COMMUNITY): Payer: Self-pay

## 2024-06-16 ENCOUNTER — Encounter: Payer: Self-pay | Admitting: Family Medicine

## 2024-06-16 DIAGNOSIS — R7303 Prediabetes: Secondary | ICD-10-CM | POA: Diagnosis not present

## 2024-06-18 ENCOUNTER — Other Ambulatory Visit (HOSPITAL_COMMUNITY): Payer: Self-pay

## 2024-06-21 ENCOUNTER — Ambulatory Visit (INDEPENDENT_AMBULATORY_CARE_PROVIDER_SITE_OTHER): Admitting: Family Medicine

## 2024-06-21 ENCOUNTER — Encounter: Payer: Self-pay | Admitting: Family Medicine

## 2024-06-21 ENCOUNTER — Other Ambulatory Visit: Payer: Self-pay | Admitting: Family Medicine

## 2024-06-21 VITALS — BP 90/60 | HR 102 | Temp 98.2°F | Ht 66.0 in | Wt 166.2 lb

## 2024-06-21 DIAGNOSIS — I952 Hypotension due to drugs: Secondary | ICD-10-CM | POA: Diagnosis not present

## 2024-06-21 DIAGNOSIS — I10 Essential (primary) hypertension: Secondary | ICD-10-CM | POA: Diagnosis not present

## 2024-06-21 DIAGNOSIS — E162 Hypoglycemia, unspecified: Secondary | ICD-10-CM

## 2024-06-21 DIAGNOSIS — Z113 Encounter for screening for infections with a predominantly sexual mode of transmission: Secondary | ICD-10-CM | POA: Diagnosis not present

## 2024-06-21 DIAGNOSIS — Z0184 Encounter for antibody response examination: Secondary | ICD-10-CM

## 2024-06-21 DIAGNOSIS — E118 Type 2 diabetes mellitus with unspecified complications: Secondary | ICD-10-CM | POA: Diagnosis not present

## 2024-06-21 DIAGNOSIS — Z0000 Encounter for general adult medical examination without abnormal findings: Secondary | ICD-10-CM

## 2024-06-21 DIAGNOSIS — Z23 Encounter for immunization: Secondary | ICD-10-CM

## 2024-06-21 DIAGNOSIS — Z7985 Long-term (current) use of injectable non-insulin antidiabetic drugs: Secondary | ICD-10-CM

## 2024-06-21 MED ORDER — FREESTYLE LIBRE 3 SENSOR MISC: 1.0000 | 11 refills | Status: DC

## 2024-06-21 MED ORDER — TIRZEPATIDE 12.5 MG/0.5ML ~~LOC~~ SOAJ: 12.5000 mg | SUBCUTANEOUS | 3 refills | Status: AC

## 2024-06-21 MED ORDER — LISINOPRIL-HYDROCHLOROTHIAZIDE 10-12.5 MG PO TABS: 1.0000 | ORAL_TABLET | Freq: Every day | ORAL | 3 refills | Status: DC

## 2024-06-21 MED ORDER — GLUCOSE BLOOD VI STRP: ORAL_STRIP | 12 refills | Status: DC

## 2024-06-21 NOTE — Progress Notes (Unsigned)
 Established Patient Office Visit   Subjective  Patient ID: Brenda Acosta, female    DOB: November 02, 1976  Age: 48 y.o. MRN: [id number]  Chief Complaint  Patient presents with   Annual Exam    Pt would like to know about prickless glucose monitor.     Pt is a 48 year old female seen for CPE and follow-up.  Patient states she is doing well overall.  Recently returned from a 1 month trip to [place].  Patient notes blood sugar has been good typically 80s-90s.  Still intermittent fasting, not eating until noon and stopping around 7 PM.  Pt on Mounjaro  15 mg weekly.  Has also noticed considerable weight loss currently 166 lbs in clinic, previously 180 lbs on 01/14/24.  Also notes BP readings lower, 90/60 this visit.  Patient denies dizziness, CP, changes in vision.  Occasionally notices in lightheadedness and moving from sitting to standing position too quickly.  Patient inquires if she had hep B vaccination.    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/15/2021   IUD (intrauterine device) in place 11/15/2021   Vitamin B12 deficiency 11/15/2021   Depression, major, single episode, moderate (HCC) 06/25/2021   Anxiety 06/25/2021   Thrombocytosis 11/12/2020   BMI 28.0-28.9,adult 08/12/2018   Status post bariatric surgery 04/08/2018   Vitamin D  deficiency 06/24/2017   Steatosis of liver 06/03/2017   Dyspnea on exertion    Chest pain    Noncompliance    Morbid obesity (HCC)    Hyperlipidemia    Diabetes mellitus without complication (HCC)    Chest pain on exertion 08/29/2015   Bilateral leg edema 08/29/2015   Type 2 diabetes mellitus with ophthalmic complication (HCC) 06/10/2013   Essential hypertension 06/10/2013   Other and unspecified hyperlipidemia 06/10/2013   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  09/2015 Ex MV: EF >55%, no ischemia/infarct.   Diabetes mellitus without complication (HCC)    a. 11/20/2015 A1c 11.5;  b.  noncompliant w/ diabetes meds, glucose checks, f/u, etc.   Dyspnea on exertion    a. 09/2015 Echo: Ef 60-65%, no rwma, mild AI, mildly dil LA.   Essential hypertension    Hyperlipidemia    Morbid obesity (HCC)    Mucinous cystadenoma of ovary    Hx of    Noncompliance    Past Surgical History:  Procedure Laterality Date   ABDOMINAL HYSTERECTOMY     partial hyst   coloscopy     LAPAROSCOPIC GASTRIC SLEEVE RESECTION  01/08/2018   MOUTH SURGERY     OOPHORECTOMY  07/02/2006   Rt; large benign ovarian tumor   Social History   Tobacco Use   Smoking status: Never   Smokeless tobacco: Never  Vaping Use   Vaping status: Never Used  Substance Use Topics   Alcohol use: No   Drug use: No   Family History  Problem Relation Age of Onset   Heart disease Mother 47       heart attack   Hypertension Father    Diabetes Father        Type II   Cancer Maternal Grandfather        prostate cancer   Colon cancer Neg Hx    Rectal cancer Neg Hx    Stomach cancer Neg Hx  Allergies  Allergen Reactions   Benzoyl Peroxide Other (See Comments)    Burns     ROS Negative unless stated above    Objective:     BP 90/60 (BP Location: Left Arm, Patient Position: Sitting, Cuff Size: Normal)   Pulse (!) 102   Temp 98.2 F (36.8 C) (Oral)   Ht 5' 6 (1.676 m)   Wt 166 lb 3.2 oz (75.4 kg)   SpO2 96%   BMI 26.83 kg/m  BP Readings from Last 3 Encounters:  06/21/24 90/60  01/14/24 98/68  11/04/23 120/70   Wt Readings from Last 3 Encounters:  06/21/24 166 lb 3.2 oz (75.4 kg)  01/14/24 180 lb 6.4 oz (81.8 kg)  11/04/23 179 lb 2 oz (81.3 kg)      Physical Exam Constitutional:      Appearance: Normal appearance.  HENT:     Head: Normocephalic and atraumatic.     Right Ear: Tympanic membrane, ear canal and external ear normal.     Left Ear: Tympanic  membrane, ear canal and external ear normal.     Nose: Nose normal.     Mouth/Throat:     Mouth: Mucous membranes are moist.     Pharynx: No oropharyngeal exudate or posterior oropharyngeal erythema.  Eyes:     General: No scleral icterus.    Extraocular Movements: Extraocular movements intact.     Conjunctiva/sclera: Conjunctivae normal.     Pupils: Pupils are equal, round, and reactive to light.  Neck:     Thyroid : No thyromegaly.  Cardiovascular:     Rate and Rhythm: Normal rate and regular rhythm.     Pulses: Normal pulses.     Heart sounds: Normal heart sounds. No murmur heard.    No friction rub.  Pulmonary:     Effort: Pulmonary effort is normal.     Breath sounds: Normal breath sounds. No wheezing, rhonchi or rales.  Abdominal:     General: Bowel sounds are normal.     Palpations: Abdomen is soft.     Tenderness: There is no abdominal tenderness.  Musculoskeletal:        General: No deformity. Normal range of motion.  Lymphadenopathy:     Cervical: No cervical adenopathy.  Skin:    General: Skin is warm and dry.     Findings: No lesion.  Neurological:     General: No focal deficit present.     Mental Status: She is alert and oriented to person, place, and time.  Psychiatric:        Mood and Affect: Mood normal.        Thought Content: Thought content normal.        01/14/2024    4:49 PM 06/19/2023    8:35 AM 03/20/2023   11:47 AM  Depression screen PHQ 2/9  Decreased Interest 0 0 0  Down, Depressed, Hopeless 0 1 0  PHQ - 2 Score 0 1 0  Altered sleeping 1 1 1   Tired, decreased energy 1 1 1   Change in appetite 1 1 3   Feeling bad or failure about yourself  0 0 0  Trouble concentrating 0 0 0  Moving slowly or fidgety/restless 0 0 0  Suicidal thoughts 0 0 0  PHQ-9 Score 3 4 5   Difficult doing work/chores Not difficult at all Not difficult at all Somewhat difficult      01/14/2024    4:49 PM 06/19/2023    8:36 AM 06/25/2021    4:09 PM  GAD 7 : Generalized  Anxiety Score  Nervous, Anxious, on Edge 1 1 3   Control/stop worrying 1 0 3  Worry too much - different things 1 1 3   Trouble relaxing 1 1 2   Restless 0 0 0  Easily annoyed or irritable 0 0 0  Afraid - awful might happen 1 0 1  Total GAD 7 Score 5 3 12   Anxiety Difficulty Not difficult at all Not difficult at all Not difficult at all     No results found for any visits on 06/21/24.    Assessment & Plan:   Well adult exam -     Comprehensive metabolic panel with GFR; Future -     TSH; Future -     T4, free; Future -     CBC with Differential/Platelet; Future -     Lipid panel; Future -     Hemoglobin A1c; Future  Need for tetanus booster -     Tdap vaccine greater than or equal to 7yo IM  Essential hypertension -     Comprehensive metabolic panel with GFR; Future -     TSH; Future -     T4, free; Future -     Lisinopril -hydroCHLOROthiazide ; Take 1 tablet by mouth daily.  Dispense: 90 tablet; Refill: 3  Controlled type 2 diabetes mellitus with complication, without long-term current use of insulin (HCC) -     Microalbumin / creatinine urine ratio -     Hemoglobin A1c; Future -     Glucose Blood; Use as instructed  Dispense: 100 each; Refill: 12 -     FreeStyle Libre 3 Sensor; 1 Device by Does not apply route every 14 (fourteen) days. Place 1 sensor on the skin every 14 days. Use to check glucose continuously  Dispense: 2 each; Refill: 11 -     Tirzepatide ; Inject 12.5 mg into the skin once a week.  Dispense: 6 mL; Refill: 3  Hypoglycemia -     Comprehensive metabolic panel with GFR; Future -     Hemoglobin A1c; Future -     Glucose Blood; Use as instructed  Dispense: 100 each; Refill: 12 -     FreeStyle Libre 3 Sensor; 1 Device by Does not apply route every 14 (fourteen) days. Place 1 sensor on the skin every 14 days. Use to check glucose continuously  Dispense: 2 each; Refill: 11  Hypotension due to drugs -     Comprehensive metabolic panel with GFR;  Future  Screening examination for STI -     HIV Antibody (routine testing w rflx) -     RPR  Immunity status testing -     Hepatitis B surface antibody,quantitative  Age-appropriate health screenings discussed.  Obtain labs.  Colonoscopy up-to-date done 09/10/2021.  Patient to schedule mammogram.  Pap not indicated 2/2 hysterectomy.  Next CPE in 1 year.  Given recent hypoglycemia, hypotension, and almost 20 pound weight loss discussed adjusting medications.  Decrease Mounjaro  from 15 mg weekly to 12.5 mg weekly.  Rx for refill on One Touch Verio test strips and CGM sent to pharmacy.  Last hemoglobin A1c 5.5% on 01/14/2024.  Patient encouraged to schedule eye exam.  Foot exam at next [facility].  Will remove spironolactone  100 mg from med list as patient stopped taking months ago due to hypotension,  was started by dermatology.  Will decrease Lisinopril -hydrochlorothiazide  from 20-25 mg daily to 10-12.5 mg daily.  Patient to monitor BP more consistently.  Consider splitting combination pill if more  significant LE edema noted with decreased dose and HCTZ.  Return in about 4 months (around 10/22/2024).  Follow-up sooner if needed.  Clotilda JONELLE Single, MD

## 2024-06-22 LAB — TSH: TSH: 0.48 u[IU]/mL (ref 0.35–5.50)

## 2024-06-22 LAB — LIPID PANEL
Cholesterol: 205 mg/dL — ABNORMAL HIGH (ref 0–200)
HDL: 52.1 mg/dL (ref >39.00)
LDL Cholesterol: 140 mg/dL — ABNORMAL HIGH (ref 0–99)
NonHDL: 152.42
Total CHOL/HDL Ratio: 4
Triglycerides: 61 mg/dL (ref 0.0–149.0)
VLDL: 12.2 mg/dL (ref 0.0–40.0)

## 2024-06-22 LAB — COMPREHENSIVE METABOLIC PANEL WITH GFR
ALT: 15 U/L (ref 0–35)
AST: 20 U/L (ref 0–37)
Albumin: 4.2 g/dL (ref 3.5–5.2)
Alkaline Phosphatase: 35 U/L — ABNORMAL LOW (ref 39–117)
BUN: 20 mg/dL (ref 6–23)
CO2: 25 meq/L (ref 19–32)
Calcium: 9.2 mg/dL (ref 8.4–10.5)
Chloride: 104 meq/L (ref 96–112)
Creatinine, Ser: 1.11 mg/dL (ref 0.40–1.20)
GFR: 58.93 mL/min — ABNORMAL LOW (ref >60.00)
Glucose, Bld: 63 mg/dL — ABNORMAL LOW (ref 70–99)
Potassium: 3.9 meq/L (ref 3.5–5.1)
Sodium: 137 meq/L (ref 135–145)
Total Bilirubin: 0.5 mg/dL (ref 0.2–1.2)
Total Protein: 7.3 g/dL (ref 6.0–8.3)

## 2024-06-22 LAB — MICROALBUMIN / CREATININE URINE RATIO
Creatinine,U: 147.6 mg/dL
Microalb Creat Ratio: UNDETERMINED mg/g (ref 0.0–30.0)
Microalb, Ur: <0.7 mg/dL

## 2024-06-22 LAB — CBC WITH DIFFERENTIAL/PLATELET
Basophils Absolute: 0.1 K/uL (ref 0.0–0.1)
Basophils Relative: 0.9 % (ref 0.0–3.0)
Eosinophils Absolute: 0.3 K/uL (ref 0.0–0.7)
Eosinophils Relative: 4.1 % (ref 0.0–5.0)
HCT: 33.9 % — ABNORMAL LOW (ref 36.0–46.0)
Hemoglobin: 11.4 g/dL — ABNORMAL LOW (ref 12.0–15.0)
Lymphocytes Relative: 47.8 % — ABNORMAL HIGH (ref 12.0–46.0)
Lymphs Abs: 3.3 K/uL (ref 0.7–4.0)
MCHC: 33.6 g/dL (ref 30.0–36.0)
MCV: 89.3 fl (ref 78.0–100.0)
Monocytes Absolute: 0.3 K/uL (ref 0.1–1.0)
Monocytes Relative: 4.7 % (ref 3.0–12.0)
Neutro Abs: 2.9 K/uL (ref 1.4–7.7)
Neutrophils Relative %: 42.5 % — ABNORMAL LOW (ref 43.0–77.0)
Platelets: 426 K/uL — ABNORMAL HIGH (ref 150.0–400.0)
RBC: 3.8 Mil/uL — ABNORMAL LOW (ref 3.87–5.11)
RDW: 12.8 % (ref 11.5–15.5)
WBC: 6.8 K/uL (ref 4.0–10.5)

## 2024-06-22 LAB — HEPATITIS B SURFACE ANTIBODY, QUANTITATIVE: Hep B S AB Quant (Post): <5 m[IU]/mL — ABNORMAL LOW (ref >=10)

## 2024-06-22 LAB — HIV ANTIBODY (ROUTINE TESTING W REFLEX): HIV 1&2 Ab, 4th Generation: NONREACTIVE

## 2024-06-22 LAB — RPR: RPR Ser Ql: NONREACTIVE

## 2024-06-22 LAB — T4, FREE: Free T4: 0.89 ng/dL (ref 0.60–1.60)

## 2024-06-22 LAB — HEMOGLOBIN A1C: Hgb A1c MFr Bld: 6 % (ref 4.6–6.5)

## 2024-06-24 ENCOUNTER — Ambulatory Visit: Payer: Self-pay | Admitting: Family Medicine

## 2024-07-26 ENCOUNTER — Telehealth: Payer: Self-pay | Admitting: Oncology

## 2024-07-26 ENCOUNTER — Inpatient Hospital Stay

## 2024-07-26 NOTE — Telephone Encounter (Signed)
 Pt was scheduled for labs today 8/25 and virtual tomorrow 8/26. Due to school traffic pt needed to r/s appts to a different day as she needed to get to work and was unable to make the appt and work. Appts have been r/s with pt.

## 2024-07-27 ENCOUNTER — Telehealth: Admitting: Physician Assistant

## 2024-07-27 ENCOUNTER — Other Ambulatory Visit: Payer: Self-pay | Admitting: Physician Assistant

## 2024-07-27 ENCOUNTER — Other Ambulatory Visit (HOSPITAL_COMMUNITY): Payer: Self-pay

## 2024-07-27 ENCOUNTER — Inpatient Hospital Stay: Attending: Oncology

## 2024-07-27 ENCOUNTER — Telehealth: Admitting: Oncology

## 2024-07-27 DIAGNOSIS — D75839 Thrombocytosis, unspecified: Secondary | ICD-10-CM | POA: Diagnosis not present

## 2024-07-27 DIAGNOSIS — U071 COVID-19: Secondary | ICD-10-CM

## 2024-07-27 DIAGNOSIS — D509 Iron deficiency anemia, unspecified: Secondary | ICD-10-CM | POA: Diagnosis not present

## 2024-07-27 DIAGNOSIS — D649 Anemia, unspecified: Secondary | ICD-10-CM

## 2024-07-27 LAB — CBC WITH DIFFERENTIAL (CANCER CENTER ONLY)
Abs Immature Granulocytes: 0.07 K/uL (ref 0.00–0.07)
Basophils Absolute: 0 K/uL (ref 0.0–0.1)
Basophils Relative: 1 %
Eosinophils Absolute: 0.2 K/uL (ref 0.0–0.5)
Eosinophils Relative: 3 %
HCT: 30.1 % — ABNORMAL LOW (ref 36.0–46.0)
Hemoglobin: 10 g/dL — ABNORMAL LOW (ref 12.0–15.0)
Immature Granulocytes: 1 %
Lymphocytes Relative: 16 %
Lymphs Abs: 1.2 K/uL (ref 0.7–4.0)
MCH: 30.2 pg (ref 26.0–34.0)
MCHC: 33.2 g/dL (ref 30.0–36.0)
MCV: 90.9 fL (ref 80.0–100.0)
Monocytes Absolute: 0.5 K/uL (ref 0.1–1.0)
Monocytes Relative: 6 %
Neutro Abs: 5.4 K/uL (ref 1.7–7.7)
Neutrophils Relative %: 73 %
Platelet Count: 301 K/uL (ref 150–400)
RBC: 3.31 MIL/uL — ABNORMAL LOW (ref 3.87–5.11)
RDW: 12.2 % (ref 11.5–15.5)
WBC Count: 7.3 K/uL (ref 4.0–10.5)
nRBC: 0 % (ref 0.0–0.2)

## 2024-07-27 LAB — CMP (CANCER CENTER ONLY)
ALT: 26 U/L (ref 0–44)
AST: 32 U/L (ref 15–41)
Albumin: 3.4 g/dL — ABNORMAL LOW (ref 3.5–5.0)
Alkaline Phosphatase: 35 U/L — ABNORMAL LOW (ref 38–126)
Anion gap: 7 (ref 5–15)
BUN: 23 mg/dL — ABNORMAL HIGH (ref 6–20)
CO2: 24 mmol/L (ref 22–32)
Calcium: 8.8 mg/dL — ABNORMAL LOW (ref 8.9–10.3)
Chloride: 107 mmol/L (ref 98–111)
Creatinine: 1.07 mg/dL — ABNORMAL HIGH (ref 0.44–1.00)
GFR, Estimated: >60 mL/min (ref >60)
Glucose, Bld: 80 mg/dL (ref 70–99)
Potassium: 3.9 mmol/L (ref 3.5–5.1)
Sodium: 138 mmol/L (ref 135–145)
Total Bilirubin: 0.8 mg/dL (ref 0.0–1.2)
Total Protein: 6.3 g/dL — ABNORMAL LOW (ref 6.5–8.1)

## 2024-07-27 LAB — TECHNOLOGIST SMEAR REVIEW: Plt Morphology: NORMAL

## 2024-07-27 LAB — RETIC PANEL
Immature Retic Fract: 7.5 % (ref 2.3–15.9)
RBC.: 3.4 MIL/uL — ABNORMAL LOW (ref 3.87–5.11)
Retic Count, Absolute: 49 K/uL (ref 19.0–186.0)
Retic Ct Pct: 1.4 % (ref 0.4–3.1)
Reticulocyte Hemoglobin: 34 pg (ref >27.9)

## 2024-07-27 MED ORDER — COVID-19 FLU A&B 3-IN-1 TEST VI KIT
1.0000 | PACK | Freq: Once | 0 refills | Status: AC
  Filled 2024-07-27: qty 1, fill #0

## 2024-07-27 MED ORDER — BENZONATATE 100 MG PO CAPS: 100.0000 mg | ORAL_CAPSULE | Freq: Three times a day (TID) | ORAL | 0 refills | Status: AC | PRN

## 2024-07-27 NOTE — Progress Notes (Signed)
 Message sent to patient requesting further input regarding current symptoms. Awaiting patient response.

## 2024-07-27 NOTE — Progress Notes (Signed)
 I have spent 5 minutes in review of e-visit questionnaire, review and updating patient chart, medical decision making and response to patient.   Elsie Velma Lunger, PA-C

## 2024-07-27 NOTE — Progress Notes (Signed)
 E-Visit for [employer] Virus / COVID Screening  Your current symptoms could be consistent with COVID.  Please complete a Covid test either at home or check with your local pharmacy to see if they provide testing.    Most people with COVID-19 have mild illness and can recover at home without medical care. Do not leave your home, except to get medical care. Do not visit public areas and do not go to places where you are unable to wear a mask. It is important that you stay home  to take care for yourself and to help protect other people in your home and community.      Isolation Instructions:   You are to isolate at home until you have been fever free for at least 24 hours without a fever-reducing medication, and symptoms have been steadily improving for 24 hours. At that time,  you can end isolation but need to mask for an additional 5 days.  If you must be around other household members who do not have symptoms, you need to make sure that both you and the family members are masking consistently with a high-quality mask.  If you note any worsening of symptoms despite treatment, please seek an in-person evaluation ASAP. If you note any significant shortness of breath or any chest pain, please seek ER evaluation. Please do not delay care!  Go to the nearest hospital ED for assessment if fever/cough/breathlessness are severe or illness seems like a threat to life.    The following symptoms may appear 2-14 days after exposure: Fever Cough Shortness of breath or difficulty breathing Chills Repeated shaking with chills Muscle pain Headache Sore throat New loss of taste or smell Fatigue Congestion or runny nose Nausea or vomiting Diarrhea  You can use medication such as I have prescribed Tessalon  Perles 100 mg. You may take 1-2 capsules every 8 hours as needed for cough  You may also take acetaminophen (Tylenol) as needed for fever.  HOME CARE Only take medications as instructed by your  medical team. Drink plenty of fluids and get plenty of rest. A steam or ultrasonic humidifier can help if you have congestion.  GET HELP RIGHT AWAY IF YOU HAVE EMERGENCY WARNING SIGNS.  Call 911 or proceed to your closest emergency facility if: You develop worsening high fever. Trouble breathing Bluish lips or face Persistent pain or pressure in the chest New confusion Inability to wake or stay awake You cough up blood. Your symptoms become more severe Inability to hold down food or fluids  This list is not all possible symptoms. Contact your medical provider for any symptoms that are severe or concerning to you.   Your e-visit answers were reviewed by a board certified advanced clinical practitioner to complete your personal care plan.  Depending on the condition, your plan could have included both over the counter or prescription medications.  If there is a problem, please reply once you have received a response from your provider.  Your safety is important to us .  If you have drug allergies check your prescription carefully.    You can use MyChart to ask questions about today's visit, request a non-urgent call back, or ask for a work or school excuse for 24 hours related to this e-Visit. If it has been greater than 24 hours you will need to follow up with your provider or enter a new e-Visit to address those concerns. You will get an e-mail in the next two days asking about your experience.  I hope that your e-visit has been valuable and will speed your recovery. Thank you for using e-visits.

## 2024-07-28 ENCOUNTER — Telehealth: Admitting: Physician Assistant

## 2024-07-28 DIAGNOSIS — Z20822 Contact with and (suspected) exposure to covid-19: Secondary | ICD-10-CM | POA: Diagnosis not present

## 2024-07-28 MED ORDER — NIRMATRELVIR/RITONAVIR (PAXLOVID)TABLET: 3.0000 | ORAL_TABLET | Freq: Two times a day (BID) | ORAL | 0 refills | Status: AC

## 2024-07-28 NOTE — Progress Notes (Signed)
 Ms. Brenda Acosta, Brenda Acosta are scheduled for a virtual visit with your provider today.    Just as we do with appointments in the office, we must obtain your consent to participate.  Your consent will be active for this visit and any virtual visit you may have with one of our providers in the next 365 days.    If you have a MyChart account, I can also send a copy of this consent to you electronically.  All virtual visits are billed to your insurance company just like a traditional visit in the office.  As this is a virtual visit, video technology does not allow for your provider to perform a traditional examination.  This may limit your provider's ability to fully assess your condition.  If your provider identifies any concerns that need to be evaluated in person or the need to arrange testing such as labs, EKG, etc, we will make arrangements to do so.    Although advances in technology are sophisticated, we cannot ensure that it will always work on either your end or our end.  If the connection with a video visit is poor, we may have to switch to a telephone visit.  With either a video or telephone visit, we are not always able to ensure that we have a secure connection.   I need to obtain your verbal consent now.   Are you willing to proceed with your visit today?   Brenda Acosta has provided verbal consent on 07/28/2024 for a virtual visit (video or telephone).   Lynden GORMAN Snuffer, [place] 07/28/2024  12:31 PM   Date:  07/28/2024   ID:  Brenda Acosta, DOB 02-06-1976, MRN [id number]  Patient Location: Home Provider Location: Home Office   Participants: Patient and Provider for Visit and Wrap up  Method of visit: Video  Location of Patient: Home Location of Provider: Home Office Consent was obtain for visit over the video. Services rendered by provider: Visit was performed via video  A video enabled telemedicine application was used and I verified that I am speaking with the correct person using  two identifiers.  PCP:  Mercer Clotilda SAUNDERS, MD   Chief Complaint:  covid  History of Present Illness:    Brenda Acosta is a 48 y.o. female with history as stated below. Presents video telehealth for an acute care visit  Pt reports generalized malaise that started yesterday. She reports body aches, chills, rhinorrhea, congestion, and cough. She did an evisit yesterday and has an rx for cough meds.  She tested positive for covid.  Past Medical, Surgical, Social History, Allergies, and Medications have been Reviewed.  Past Medical History:  Diagnosis Date   Chest pain    a. 09/2015 Ex MV: EF >55%, no ischemia/infarct.   Diabetes mellitus without complication (HCC)    a. 11/20/2015 A1c 11.5;  b.  noncompliant w/ diabetes meds, glucose checks, f/u, etc.   Dyspnea on exertion    a. 09/2015 Echo: Ef 60-65%, no rwma, mild AI, mildly dil LA.   Essential hypertension    Hyperlipidemia    Morbid obesity (HCC)    Mucinous cystadenoma of ovary    Hx of    Noncompliance     No outpatient medications have been marked as taking for the 07/28/24 encounter (Appointment) with [facility] PROVIDER.     Allergies:   Benzoyl peroxide   ROS See HPI for history of present illness.  Physical Exam Constitutional:      Appearance: Normal appearance.  Pulmonary:     Effort: Pulmonary effort is normal.  Neurological:     Mental Status: She is alert.               MDM: Pt with covid. Has rx for cough medications. Given multiple comorbidities, pt given rx for paxlovid .    Tests Ordered: No orders of the defined types were placed in this encounter.   Medication Changes: No orders of the defined types were placed in this encounter.    Disposition:  Follow up  Signed, Lynden GORMAN Snuffer, PA-C  07/28/2024 12:31 PM

## 2024-07-28 NOTE — Patient Instructions (Signed)
 Brenda Acosta, thank you for joining Lynden GORMAN Snuffer, PA-C for today's virtual visit.  While this provider is not your primary care provider (PCP), if your PCP is located in our provider database this encounter information will be shared with them immediately following your visit.   A [facility] MyChart account gives you access to today's visit and all your visits, tests, and labs performed at [facility]  click here if you don't have a [facility] MyChart account or go to [email or web address]  Consent: (Patient) Brenda Acosta provided verbal consent for this virtual visit at the beginning of the encounter.  Current Medications:  Current Outpatient Medications:    benzonatate  (TESSALON ) 100 MG capsule, Take 1 capsule (100 mg total) by mouth 3 (three) times daily as needed for cough., Disp: 30 capsule, Rfl: 0   Blood Glucose Monitoring Suppl (ONETOUCH VERIO REFLECT) w/Device KIT, Test blood sugar in the morning, at noon, and at bedtime., Disp: 1 kit, Rfl: 0   clobetasol  (TEMOVATE ) 0.05 % external solution, Apply topically 2 (two) times a day neck down  (PATIENT WILL MIX  WITH CERAVE), Disp: 50 mL, Rfl: 1   Continuous Glucose Sensor (FREESTYLE LIBRE 3 SENSOR) MISC, 1 Device by Does not apply route every 14 (fourteen) days. Place 1 sensor on the skin every 14 days. Use to check glucose continuously, Disp: 2 each, Rfl: 11   Cyanocobalamin  (B-12) 1000 MCG SUBL, Place 1,000 mcg under the tongue See admin instructions. Take 3 time per week, Disp: 90 tablet, Rfl: 1   fluconazole  (DIFLUCAN ) 150 MG tablet, Take 1 tablet (150 mg total) by mouth daily., Disp: 1 tablet, Rfl: 0   fluticasone  (FLONASE ) 50 MCG/ACT nasal spray, Place 2 sprays into both nostrils daily., Disp: 16 g, Rfl: 0   furosemide  (LASIX ) 20 MG tablet, Take 1 tablet (20 mg total) by mouth daily as needed for lower extremity edema, Disp: 90 tablet, Rfl: 1   hydrOXYzine  (ATARAX /VISTARIL ) 25 MG tablet, Take 1 tablet  (25 mg total) by mouth at bedtime as needed for anxiety., Disp: 30 tablet, Rfl: 3   Influenza-SARS At Home Test (COVID-19 FLU A&B 3-IN-1 TEST) KIT, 1 kit by Does not apply route once for 1 dose. Inst -- Use once as directed to test for Flu and COVID-19., Disp: 1 kit, Rfl: 0   ipratropium (ATROVENT ) 0.03 % nasal spray, Place 2 sprays into both nostrils every 12 (twelve) hours., Disp: 30 mL, Rfl: 12   Iron -Vitamin C  65-125 MG TABS, Take 1 tablet by mouth daily., Disp: 30 tablet, Rfl: 2   Lancets (ONETOUCH DELICA PLUS LANCET33G) MISC, Test blood sugar in the morning, at noon, and at bedtime., Disp: 100 each, Rfl: 11   lisinopril -hydrochlorothiazide  (ZESTORETIC ) 10-12.5 MG tablet, Take 1 tablet by mouth daily., Disp: 90 tablet, Rfl: 3   meloxicam  (MOBIC ) 7.5 MG tablet, Take 1 tablet by mouth once daily, Disp: 30 tablet, Rfl: 0   neomycin -polymyxin-hydrocortisone (CORTISPORIN ) 3.5-10000-1 ophthalmic suspension, Place 3 drops into the left eye 4 (four) times daily., Disp: 7.5 mL, Rfl: 0   ONETOUCH VERIO test strip, USE AS DIRECTED, Disp: 100 each, Rfl: 12   pantoprazole  (PROTONIX ) 20 MG tablet, Take 1 tablet (20 mg total) by mouth daily., Disp: 90 tablet, Rfl: 1   potassium chloride  SA (KLOR-CON  M) 20 MEQ tablet, Take 1 tablet (20 mEq total) by mouth daily., Disp: 90 tablet, Rfl: 0   senna-docusate (SENOKOT-S) 8.6-50 MG tablet, Take 1 tablet by mouth daily., Disp: 30 tablet, Rfl:  1   tazarotene  (TAZORAC ) 0.05 % cream, Apply topically every night at bedtime, Disp: 60 g, Rfl: 3   tirzepatide  (MOUNJARO ) 12.5 MG/0.5ML Pen, Inject 12.5 mg into the skin once a week., Disp: 6 mL, Rfl: 3   tretinoin  (RETIN-A ) 0.1 % cream, Apply one pea sized amount to face every night, Disp: 45 g, Rfl: 6   Medications ordered in this encounter:  No orders of the defined types were placed in this encounter.    *If you need refills on other medications prior to your next appointment, please contact your  pharmacy*  Follow-Up: Call back or seek an in-person evaluation if the symptoms worsen or if the condition fails to improve as anticipated.  [facility] [phone]  Other Instructions Take paxlovid  as directed   Follow up with your regular doctor in 1 week for reassessment and seek care sooner if your symptoms worsen or fail to improve.    If you have been instructed to have an in-person evaluation today at a local Urgent Care facility, please use the link below. It will take you to a list of all of our available [facility], including address, phone number and hours of operation. Please do not delay care.  [facility]  If you or a family member do not have a primary care provider, use the link below to schedule a visit and establish care. When you choose a [facility] primary care physician or advanced practice provider, you gain a long-term partner in health. Find a Primary Care Provider  Learn more about [facility]'s in-office and virtual care options: [facility] - Get Care Now

## 2024-08-03 ENCOUNTER — Encounter: Payer: Self-pay | Admitting: Oncology

## 2024-08-03 ENCOUNTER — Inpatient Hospital Stay: Attending: Oncology | Admitting: Oncology

## 2024-08-03 DIAGNOSIS — D649 Anemia, unspecified: Secondary | ICD-10-CM

## 2024-08-03 DIAGNOSIS — E8809 Other disorders of plasma-protein metabolism, not elsewhere classified: Secondary | ICD-10-CM | POA: Insufficient documentation

## 2024-08-03 DIAGNOSIS — Z903 Acquired absence of stomach [part of]: Secondary | ICD-10-CM

## 2024-08-03 DIAGNOSIS — D75839 Thrombocytosis, unspecified: Secondary | ICD-10-CM | POA: Diagnosis not present

## 2024-08-03 NOTE — Assessment & Plan Note (Signed)
 History of gastric sleeve resection.   monitor iron  panel, B12, folate periodically.

## 2024-08-03 NOTE — Progress Notes (Signed)
 HEMATOLOGY-ONCOLOGY TeleHEALTH VISIT PROGRESS NOTE  I connected with Brenda Acosta on 08/03/24  at  2:45 PM EDT by video enabled telemedicine visit and verified that I am speaking with the correct person using two identifiers. I discussed the limitations, risks, security and privacy concerns of performing an evaluation and management service by telemedicine and the availability of in-person appointments. The patient expressed understanding and agreed to proceed.   Other persons participating in the visit and their role in the encounter:  None  Patient's location: at work Provider's location: office Chief Complaint: Thrombocytosis, iron  deficiency anemia, history of gastric sleeve   INTERVAL HISTORY Brenda Acosta is a 48 y.o. female who has above history reviewed by me today presents for follow up visit for thrombocytosis, iron  deficiency anemia, history of gastric sleeve Take multi vitamin that contains iron .  No new complaints.   Past Medical History:  Diagnosis Date   Chest pain    a. 09/2015 Ex MV: EF >55%, no ischemia/infarct.   Diabetes mellitus without complication (HCC)    a. 11/20/2015 A1c 11.5;  b.  noncompliant w/ diabetes meds, glucose checks, f/u, etc.   Dyspnea on exertion    a. 09/2015 Echo: Ef 60-65%, no rwma, mild AI, mildly dil LA.   Essential hypertension    Hyperlipidemia    Morbid obesity (HCC)    Mucinous cystadenoma of ovary    Hx of    Noncompliance    Past Surgical History:  Procedure Laterality Date   ABDOMINAL HYSTERECTOMY     partial hyst   coloscopy     LAPAROSCOPIC GASTRIC SLEEVE RESECTION  01/08/2018   MOUTH SURGERY     OOPHORECTOMY  07/02/2006   Rt; large benign ovarian tumor    Family History  Problem Relation Age of Onset   Heart disease Mother 64       heart attack   Hypertension Father    Diabetes Father        Type II   Cancer Maternal Grandfather        prostate cancer   Colon cancer Neg Hx    Rectal cancer Neg Hx     Stomach cancer Neg Hx     Social History   Socioeconomic History   Marital status: Single    Spouse name: Not on file   Number of children: 0   Years of education: Not on file   Highest education level: Professional school degree (e.g., MD, DDS, DVM, JD)  Occupational History    Employer: [employer]  Tobacco Use   Smoking status: Never   Smokeless tobacco: Never  Vaping Use   Vaping status: Never Used  Substance and Sexual Activity   Alcohol use: No   Drug use: No   Sexual activity: Yes    Partners: Male  Other Topics Concern   Not on file  Social History Narrative   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 Avg. Glucose: 298      Regular exercise: no   Caffeine use: occassionally                  Social Drivers of Corporate investment banker Strain: Low Risk  (06/17/2024)   Overall Financial Resource Strain (CARDIA)    Difficulty of Paying Living Expenses: Not hard at  all  Food Insecurity: No Food Insecurity (06/17/2024)   Hunger Vital Sign    Worried About Running Out of Food in the Last Year: Never true    Ran Out of Food in the Last Year: Never true  Transportation Needs: No Transportation Needs (06/17/2024)   PRAPARE - Administrator, Civil Service (Medical): No    Lack of Transportation (Non-Medical): No  Physical Activity: Insufficiently Active (06/17/2024)   Exercise Vital Sign    Days of Exercise per Week: 3 days    Minutes of Exercise per Session: 30 min  Stress: Stress Concern Present (06/17/2024)   [employer] of Occupational Health - Occupational Stress Questionnaire    Feeling of Stress: To some extent  Social Connections: Socially Integrated (06/17/2024)   Social Connection and Isolation Panel    Frequency of Communication with Friends and Family: More than three times a week     Frequency of Social Gatherings with Friends and Family: More than three times a week    Attends Religious Services: More than 4 times per year    Active Member of [employer] or Organizations: Yes    Attends Engineer, structural: More than 4 times per year    Marital Status: Living with partner  Intimate Partner Violence: Not on file    Current Outpatient Medications on File Prior to Visit  Medication Sig Dispense Refill   benzonatate  (TESSALON ) 100 MG capsule Take 1 capsule (100 mg total) by mouth 3 (three) times daily as needed for cough. 30 capsule 0   Blood Glucose Monitoring Suppl (ONETOUCH VERIO REFLECT) w/Device KIT Test blood sugar in the morning, at noon, and at bedtime. 1 kit 0   clobetasol  (TEMOVATE ) 0.05 % external solution Apply topically 2 (two) times a day neck down  (PATIENT WILL MIX  WITH CERAVE) 50 mL 1   Cyanocobalamin  (B-12) 1000 MCG SUBL Place 1,000 mcg under the tongue See admin instructions. Take 3 time per week 90 tablet 1   fluconazole  (DIFLUCAN ) 150 MG tablet Take 1 tablet (150 mg total) by mouth daily. 1 tablet 0   fluticasone  (FLONASE ) 50 MCG/ACT nasal spray Place 2 sprays into both nostrils daily. 16 g 0   furosemide  (LASIX ) 20 MG tablet Take 1 tablet (20 mg total) by mouth daily as needed for lower extremity edema 90 tablet 1   hydrOXYzine  (ATARAX /VISTARIL ) 25 MG tablet Take 1 tablet (25 mg total) by mouth at bedtime as needed for anxiety. 30 tablet 3   ipratropium (ATROVENT ) 0.03 % nasal spray Place 2 sprays into both nostrils every 12 (twelve) hours. 30 mL 12   Lancets (ONETOUCH DELICA PLUS LANCET33G) MISC Test blood sugar in the morning, at noon, and at bedtime. 100 each 11   lisinopril -hydrochlorothiazide  (ZESTORETIC ) 10-12.5 MG tablet Take 1 tablet by mouth daily. 90 tablet 3   neomycin -polymyxin-hydrocortisone (CORTISPORIN ) 3.5-10000-1 ophthalmic suspension Place 3 drops into the left eye 4 (four) times daily. 7.5 mL 0   ONETOUCH VERIO test strip USE AS  DIRECTED 100 each 12   pantoprazole  (PROTONIX ) 20 MG tablet Take 1 tablet (20 mg total) by mouth daily. 90 tablet 1   potassium chloride  SA (KLOR-CON  M) 20 MEQ tablet Take 1 tablet (20 mEq total) by mouth daily. 90 tablet 0   senna-docusate (SENOKOT-S) 8.6-50 MG tablet Take 1 tablet by mouth daily. 30 tablet 1   tazarotene  (TAZORAC ) 0.05 % cream Apply topically every night at bedtime 60 g 3   tirzepatide  (MOUNJARO ) 12.5 MG/0.5ML  Pen Inject 12.5 mg into the skin once a week. 6 mL 3   tretinoin  (RETIN-A ) 0.1 % cream Apply one pea sized amount to face every night 45 g 6   Continuous Glucose Sensor (FREESTYLE LIBRE 3 SENSOR) MISC 1 Device by Does not apply route every 14 (fourteen) days. Place 1 sensor on the skin every 14 days. Use to check glucose continuously 2 each 11   No current facility-administered medications on file prior to visit.    Allergies  Allergen Reactions   Benzoyl Peroxide Other (See Comments)    Burns     Review of Systems  Constitutional:  Positive for fatigue. Negative for appetite change, chills and fever.  HENT:   Negative for hearing loss.   Cardiovascular:  Negative for chest pain.  Genitourinary:  Negative for difficulty urinating.   Musculoskeletal:  Negative for arthralgias.  Skin:  Negative for itching and rash.  Neurological:  Negative for extremity weakness.  Hematological:  Negative for adenopathy.  Psychiatric/Behavioral:  Negative for confusion.       Observations/Objective: There were no vitals filed for this visit.  There is no height or weight on file to calculate BMI.  Physical Exam Neurological:     Mental Status: She is alert.     Labs    Latest Ref Rng & Units 07/27/2024    8:08 AM 06/21/2024    3:28 PM 03/30/2024    8:09 AM  CBC  WBC 4.0 - 10.5 K/uL 7.3  6.8  6.3   Hemoglobin 12.0 - 15.0 g/dL 89.9  88.5  89.3   Hematocrit 36.0 - 46.0 % 30.1  33.9  32.2   Platelets 150 - 400 K/uL 301  426.0  351       Latest Ref Rng & Units  07/27/2024    8:08 AM 06/21/2024    3:28 PM 04/08/2024    8:09 AM  CMP  Glucose 70 - 99 mg/dL 80  63  80   BUN 6 - 20 mg/dL 23  20  18    Creatinine 0.44 - 1.00 mg/dL 8.92  8.88  8.90   Sodium 135 - 145 mmol/L 138  137  137   Potassium 3.5 - 5.1 mmol/L 3.9  3.9  3.7   Chloride 98 - 111 mmol/L 107  104  106   CO2 22 - 32 mmol/L 24  25  19    Calcium 8.9 - 10.3 mg/dL 8.8  9.2  9.1   Total Protein 6.5 - 8.1 g/dL 6.3  7.3  7.2   Total Bilirubin 0.0 - 1.2 mg/dL 0.8  0.5  0.8   Alkaline Phos 38 - 126 U/L 35  35  41   AST 15 - 41 U/L 32  20  17   ALT 0 - 44 U/L 26  15  12       ASSESSMENT & PLAN:   Normocytic anemia Anemia, hemoglobin decreased Labs reviewed and discussed with patient.  Lab Results  Component Value Date   HGB 10.0 (L) 07/27/2024   TIBC 288 03/30/2024   IRONPCTSAT 22 03/30/2024   FERRITIN 74 03/30/2024    Hemoglobin has decreased. She has normal iron  panel, normal B12 and folate. Normal LDH, TSH haptoglobin. No M protein.  Hemoglobinopathy evaluation was negative.  Will check alpha thalassemia genotype.   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   monitor iron  panel, B12, folate periodically.  Hypoalbuminemia Recommend increase protein intake   Orders Placed This Encounter  Procedures   CMP (Cancer Center only)    Standing Status:   Future    Expected Date:   11/02/2024    Expiration Date:   01/31/2025   CBC with Differential (Cancer Center Only)    Standing Status:   Future    Expected Date:   11/02/2024    Expiration Date:   01/31/2025   Iron  and TIBC    Standing Status:   Future    Expected Date:   11/02/2024    Expiration Date:   01/31/2025   Ferritin    Standing Status:   Future    Expected Date:   11/02/2024    Expiration Date:   01/31/2025   Vitamin B12    Standing Status:   Future    Expected Date:   11/02/2024    Expiration Date:   01/31/2025   Retic Panel    Standing Status:   Future    Expected Date:   11/02/2024    Expiration  Date:   01/31/2025   Alpha-Thalassemia Analysis    Standing Status:   Future    Expected Date:   11/02/2024    Expiration Date:   01/31/2025    Indication:   screening   Folate    Standing Status:   Future    Expected Date:   11/02/2024    Expiration Date:   01/31/2025   Follow up 3 months She prefers virtual MD visits for future appointments.    Zelphia Cap, MD, PhD [facility] Hematology Oncology 08/03/2024

## 2024-08-03 NOTE — Assessment & Plan Note (Signed)
 Anemia, hemoglobin decreased Labs reviewed and discussed with patient.  Lab Results  Component Value Date   HGB 10.0 (L) 07/27/2024   TIBC 288 03/30/2024   IRONPCTSAT 22 03/30/2024   FERRITIN 74 03/30/2024    Hemoglobin has decreased. She has normal iron  panel, normal B12 and folate. Normal LDH, TSH haptoglobin. No M protein.  Hemoglobinopathy evaluation was negative.  Will check alpha thalassemia genotype.

## 2024-08-03 NOTE — Assessment & Plan Note (Signed)
 Recommend increase protein intake

## 2024-09-14 ENCOUNTER — Other Ambulatory Visit: Payer: Self-pay | Admitting: Family Medicine

## 2024-09-14 DIAGNOSIS — K219 Gastro-esophageal reflux disease without esophagitis: Secondary | ICD-10-CM

## 2024-09-15 ENCOUNTER — Other Ambulatory Visit: Payer: Self-pay | Admitting: Family Medicine

## 2024-10-30 ENCOUNTER — Encounter: Payer: Self-pay | Admitting: Gastroenterology

## 2024-11-02 ENCOUNTER — Inpatient Hospital Stay: Attending: Oncology

## 2024-11-02 ENCOUNTER — Telehealth: Payer: Self-pay | Admitting: Oncology

## 2024-11-02 ENCOUNTER — Inpatient Hospital Stay

## 2024-11-02 DIAGNOSIS — D509 Iron deficiency anemia, unspecified: Secondary | ICD-10-CM | POA: Diagnosis present

## 2024-11-02 DIAGNOSIS — D649 Anemia, unspecified: Secondary | ICD-10-CM

## 2024-11-02 DIAGNOSIS — R5383 Other fatigue: Secondary | ICD-10-CM | POA: Diagnosis not present

## 2024-11-02 DIAGNOSIS — D75839 Thrombocytosis, unspecified: Secondary | ICD-10-CM | POA: Diagnosis not present

## 2024-11-02 DIAGNOSIS — Z79899 Other long term (current) drug therapy: Secondary | ICD-10-CM | POA: Diagnosis not present

## 2024-11-02 DIAGNOSIS — Z8042 Family history of malignant neoplasm of prostate: Secondary | ICD-10-CM | POA: Insufficient documentation

## 2024-11-02 DIAGNOSIS — Z9884 Bariatric surgery status: Secondary | ICD-10-CM | POA: Insufficient documentation

## 2024-11-02 LAB — FOLATE: Folate: 17.4 ng/mL (ref >5.9)

## 2024-11-02 LAB — VITAMIN B12: Vitamin B-12: 690 pg/mL (ref 180–914)

## 2024-11-02 LAB — CMP (CANCER CENTER ONLY)
ALT: 14 U/L (ref 0–44)
AST: 18 U/L (ref 15–41)
Albumin: 3.9 g/dL (ref 3.5–5.0)
Alkaline Phosphatase: 43 U/L (ref 38–126)
Anion gap: 8 (ref 5–15)
BUN: 22 mg/dL — ABNORMAL HIGH (ref 6–20)
CO2: 24 mmol/L (ref 22–32)
Calcium: 9.5 mg/dL (ref 8.9–10.3)
Chloride: 109 mmol/L (ref 98–111)
Creatinine: 0.98 mg/dL (ref 0.44–1.00)
GFR, Estimated: >60 mL/min (ref >60)
Glucose, Bld: 100 mg/dL — ABNORMAL HIGH (ref 70–99)
Potassium: 4.1 mmol/L (ref 3.5–5.1)
Sodium: 141 mmol/L (ref 135–145)
Total Bilirubin: 0.3 mg/dL (ref 0.0–1.2)
Total Protein: 6.9 g/dL (ref 6.5–8.1)

## 2024-11-02 LAB — CBC WITH DIFFERENTIAL (CANCER CENTER ONLY)
Abs Immature Granulocytes: 0.02 K/uL (ref 0.00–0.07)
Basophils Absolute: 0.1 K/uL (ref 0.0–0.1)
Basophils Relative: 1 %
Eosinophils Absolute: 0.3 K/uL (ref 0.0–0.5)
Eosinophils Relative: 3 %
HCT: 32 % — ABNORMAL LOW (ref 36.0–46.0)
Hemoglobin: 10.8 g/dL — ABNORMAL LOW (ref 12.0–15.0)
Immature Granulocytes: 0 %
Lymphocytes Relative: 39 %
Lymphs Abs: 2.9 K/uL (ref 0.7–4.0)
MCH: 30.2 pg (ref 26.0–34.0)
MCHC: 33.8 g/dL (ref 30.0–36.0)
MCV: 89.4 fL (ref 80.0–100.0)
Monocytes Absolute: 0.4 K/uL (ref 0.1–1.0)
Monocytes Relative: 5 %
Neutro Abs: 3.8 K/uL (ref 1.7–7.7)
Neutrophils Relative %: 52 %
Platelet Count: 344 K/uL (ref 150–400)
RBC: 3.58 MIL/uL — ABNORMAL LOW (ref 3.87–5.11)
RDW: 12.1 % (ref 11.5–15.5)
WBC Count: 7.4 K/uL (ref 4.0–10.5)
nRBC: 0 % (ref 0.0–0.2)

## 2024-11-02 LAB — RETIC PANEL
Immature Retic Fract: 5.4 % (ref 2.3–15.9)
RBC.: 3.56 MIL/uL — ABNORMAL LOW (ref 3.87–5.11)
Retic Count, Absolute: 37 K/uL (ref 19.0–186.0)
Retic Ct Pct: 1 % (ref 0.4–3.1)
Reticulocyte Hemoglobin: 33.8 pg (ref >27.9)

## 2024-11-02 LAB — IRON AND TIBC
Iron: 50 ug/dL (ref 28–170)
Saturation Ratios: 18 % (ref 10.4–31.8)
TIBC: 286 ug/dL (ref 250–450)
UIBC: 235 ug/dL

## 2024-11-02 LAB — FERRITIN: Ferritin: 93 ng/mL (ref 11–307)

## 2024-11-02 NOTE — Telephone Encounter (Signed)
 Pt called to request pt move lab appt to a sooner time today - moved appt up and confirmed time w/pt - [facility]

## 2024-11-09 ENCOUNTER — Inpatient Hospital Stay (HOSPITAL_BASED_OUTPATIENT_CLINIC_OR_DEPARTMENT_OTHER): Admitting: Oncology

## 2024-11-09 ENCOUNTER — Encounter: Payer: Self-pay | Admitting: Oncology

## 2024-11-09 ENCOUNTER — Encounter: Payer: Self-pay | Admitting: Family Medicine

## 2024-11-09 DIAGNOSIS — D649 Anemia, unspecified: Secondary | ICD-10-CM | POA: Diagnosis not present

## 2024-11-09 DIAGNOSIS — Z903 Acquired absence of stomach [part of]: Secondary | ICD-10-CM

## 2024-11-09 NOTE — Progress Notes (Addendum)
 HEMATOLOGY-ONCOLOGY TeleHEALTH VISIT PROGRESS NOTE  I connected with Brenda Acosta on 11/09/24  at  3:30 PM EST by video enabled telemedicine visit and verified that I am speaking with the correct person using two identifiers. I discussed the limitations, risks, security and privacy concerns of performing an evaluation and management service by telemedicine and the availability of in-person appointments. The patient expressed understanding and agreed to proceed.   Other persons participating in the visit and their role in the encounter:  None  Patient's location: at work Provider's location: office Chief Complaint: Thrombocytosis, iron  deficiency anemia, history of gastric sleeve   INTERVAL HISTORY Brenda Acosta is a 48 y.o. female who has above history reviewed by me today presents for follow up visit for thrombocytosis, iron  deficiency anemia, history of gastric sleeve Take multi vitamin that contains iron .  No new complaints.   Past Medical History:  Diagnosis Date   Chest pain    a. 09/2015 Ex MV: EF >55%, no ischemia/infarct.   Diabetes mellitus without complication (HCC)    a. 11/20/2015 A1c 11.5;  b.  noncompliant w/ diabetes meds, glucose checks, f/u, etc.   Dyspnea on exertion    a. 09/2015 Echo: Ef 60-65%, no rwma, mild AI, mildly dil LA.   Essential hypertension    Hyperlipidemia    Morbid obesity (HCC)    Mucinous cystadenoma of ovary    Hx of    Noncompliance    Past Surgical History:  Procedure Laterality Date   ABDOMINAL HYSTERECTOMY     partial hyst   coloscopy     LAPAROSCOPIC GASTRIC SLEEVE RESECTION  01/08/2018   MOUTH SURGERY     OOPHORECTOMY  07/02/2006   Rt; large benign ovarian tumor    Family History  Problem Relation Age of Onset   Heart disease Mother 31       heart attack   Hypertension Father    Diabetes Father        Type II   Cancer Maternal Grandfather        prostate cancer   Colon cancer Neg Hx    Rectal cancer Neg Hx     Stomach cancer Neg Hx     Social History   Socioeconomic History   Marital status: Single    Spouse name: Not on file   Number of children: 0   Years of education: Not on file   Highest education level: Professional school degree (e.g., MD, DDS, DVM, JD)  Occupational History    Employer: [employer]  Tobacco Use   Smoking status: Never   Smokeless tobacco: Never  Vaping Use   Vaping status: Never Used  Substance and Sexual Activity   Alcohol use: No   Drug use: No   Sexual activity: Yes    Partners: Male  Other Topics Concern   Not on file  Social History Narrative   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 Avg. Glucose: 298      Regular exercise: no   Caffeine use: occassionally                  Social Drivers of Corporate Investment Banker Strain: Low Risk  (06/17/2024)   Overall Financial Resource Strain (CARDIA)    Difficulty of Paying Living Expenses: Not hard at  all  Food Insecurity: No Food Insecurity (06/17/2024)   Hunger Vital Sign    Worried About Running Out of Food in the Last Year: Never true    Ran Out of Food in the Last Year: Never true  Transportation Needs: No Transportation Needs (06/17/2024)   PRAPARE - Administrator, Civil Service (Medical): No    Lack of Transportation (Non-Medical): No  Physical Activity: Insufficiently Active (06/17/2024)   Exercise Vital Sign    Days of Exercise per Week: 3 days    Minutes of Exercise per Session: 30 min  Stress: Stress Concern Present (06/17/2024)   [employer] of Occupational Health - Occupational Stress Questionnaire    Feeling of Stress: To some extent  Social Connections: Socially Integrated (06/17/2024)   Social Connection and Isolation Panel    Frequency of Communication with Friends and Family: More than three times a week     Frequency of Social Gatherings with Friends and Family: More than three times a week    Attends Religious Services: More than 4 times per year    Active Member of [employer] or Organizations: Yes    Attends Engineer, Structural: More than 4 times per year    Marital Status: Living with partner  Intimate Partner Violence: Not on file    Current Outpatient Medications on File Prior to Visit  Medication Sig Dispense Refill   benzonatate  (TESSALON ) 100 MG capsule Take 1 capsule (100 mg total) by mouth 3 (three) times daily as needed for cough. 30 capsule 0   Blood Glucose Monitoring Suppl (ONETOUCH VERIO REFLECT) w/Device KIT Test blood sugar in the morning, at noon, and at bedtime. 1 kit 0   clobetasol  (TEMOVATE ) 0.05 % external solution Apply topically 2 (two) times a day neck down  (PATIENT WILL MIX  WITH CERAVE) 50 mL 1   Cyanocobalamin  (B-12) 1000 MCG SUBL Place 1,000 mcg under the tongue See admin instructions. Take 3 time per week 90 tablet 1   fluconazole  (DIFLUCAN ) 150 MG tablet Take 1 tablet (150 mg total) by mouth daily. 1 tablet 0   fluticasone  (FLONASE ) 50 MCG/ACT nasal spray Place 2 sprays into both nostrils daily. 16 g 0   furosemide  (LASIX ) 20 MG tablet Take 1 tablet (20 mg total) by mouth daily as needed for lower extremity edema 90 tablet 1   hydrOXYzine  (ATARAX /VISTARIL ) 25 MG tablet Take 1 tablet (25 mg total) by mouth at bedtime as needed for anxiety. 30 tablet 3   ipratropium (ATROVENT ) 0.03 % nasal spray Place 2 sprays into both nostrils every 12 (twelve) hours. 30 mL 12   Lancets (ONETOUCH DELICA PLUS LANCET33G) MISC Test blood sugar in the morning, at noon, and at bedtime. 100 each 11   lisinopril -hydrochlorothiazide  (ZESTORETIC ) 10-12.5 MG tablet Take 1 tablet by mouth daily. 90 tablet 3   neomycin -polymyxin-hydrocortisone (CORTISPORIN ) 3.5-10000-1 ophthalmic suspension Place 3 drops into the left eye 4 (four) times daily. 7.5 mL 0   ONETOUCH VERIO test strip USE AS  DIRECTED 100 each 12   pantoprazole  (PROTONIX ) 20 MG tablet Take 1 tablet by mouth once daily 90 tablet 0   potassium chloride  SA (KLOR-CON  M) 20 MEQ tablet Take 1 tablet (20 mEq total) by mouth daily. 90 tablet 0   senna-docusate (SENOKOT-S) 8.6-50 MG tablet Take 1 tablet by mouth daily. 30 tablet 1   tazarotene  (TAZORAC ) 0.05 % cream Apply topically every night at bedtime 60 g 3   tirzepatide  (MOUNJARO ) 12.5 MG/0.5ML Pen Inject  12.5 mg into the skin once a week. 6 mL 3   tretinoin  (RETIN-A ) 0.1 % cream Apply one pea sized amount to face every night 45 g 6   lisinopril -hydrochlorothiazide  (ZESTORETIC ) 20-25 MG tablet Take 1 tablet by mouth once daily (Patient not taking: Reported on 11/09/2024) 90 tablet 0   No current facility-administered medications on file prior to visit.    Allergies  Allergen Reactions   Benzoyl Peroxide Other (See Comments)    Burns     Review of Systems  Constitutional:  Positive for fatigue. Negative for appetite change, chills and fever.  HENT:   Negative for hearing loss.   Cardiovascular:  Negative for chest pain.  Genitourinary:  Negative for difficulty urinating.   Musculoskeletal:  Negative for arthralgias.  Skin:  Negative for itching and rash.  Neurological:  Negative for extremity weakness.  Hematological:  Negative for adenopathy.  Psychiatric/Behavioral:  Negative for confusion.       Observations/Objective: There were no vitals filed for this visit.  There is no height or weight on file to calculate BMI.  Physical Exam Neurological:     Mental Status: She is alert.     Labs    Latest Ref Rng & Units 11/02/2024    1:14 PM 07/27/2024    8:08 AM 06/21/2024    3:28 PM  CBC  WBC 4.0 - 10.5 K/uL 7.4  7.3  6.8   Hemoglobin 12.0 - 15.0 g/dL 89.1  89.9  88.5   Hematocrit 36.0 - 46.0 % 32.0  30.1  33.9   Platelets 150 - 400 K/uL 344  301  426.0       Latest Ref Rng & Units 11/02/2024    1:14 PM 07/27/2024    8:08 AM 06/21/2024    3:28 PM   CMP  Glucose 70 - 99 mg/dL 899  80  63   BUN 6 - 20 mg/dL 22  23  20    Creatinine 0.44 - 1.00 mg/dL 9.01  8.92  8.88   Sodium 135 - 145 mmol/L 141  138  137   Potassium 3.5 - 5.1 mmol/L 4.1  3.9  3.9   Chloride 98 - 111 mmol/L 109  107  104   CO2 22 - 32 mmol/L 24  24  25    Calcium 8.9 - 10.3 mg/dL 9.5  8.8  9.2   Total Protein 6.5 - 8.1 g/dL 6.9  6.3  7.3   Total Bilirubin 0.0 - 1.2 mg/dL 0.3  0.8  0.5   Alkaline Phos 38 - 126 U/L 43  35  35   AST 15 - 41 U/L 18  32  20   ALT 0 - 44 U/L 14  26  15       ASSESSMENT & PLAN:   Normocytic anemia Anemia, hemoglobin decreased Labs reviewed and discussed with patient.  Lab Results  Component Value Date   HGB 10.8 (L) 11/02/2024   TIBC 286 11/02/2024   IRONPCTSAT 18 11/02/2024   FERRITIN 93 11/02/2024    Hemoglobin has is slightly improved. She has normal iron  panel, normal B12 and folate. Normal LDH, TSH haptoglobin. No M protein.  Hemoglobinopathy evaluation was negative.  Will check alpha thalassemia genotype. 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   monitor iron  panel, B12, folate periodically. Adequate level   Orders Placed This Encounter  Procedures  Human parvovirus DNA detection by PCR    Standing Status:   Future    Expected Date:   03/10/2025    Expiration Date:   11/09/2025   CBC with Differential (Cancer Center Only)    Standing Status:   Future    Expected Date:   03/10/2025    Expiration Date:   06/08/2025   Retic Panel    Standing Status:   Future    Expected Date:   03/10/2025    Expiration Date:   06/08/2025   Follow up 4 months She prefers virtual MD visits for future appointments.    Zelphia Cap, MD, PhD [facility] Hematology Oncology 11/09/2024

## 2024-11-09 NOTE — Assessment & Plan Note (Signed)
 History of gastric sleeve resection.   monitor iron  panel, B12, folate periodically. Adequate level

## 2024-11-09 NOTE — Assessment & Plan Note (Signed)
 Anemia, hemoglobin decreased Labs reviewed and discussed with patient.  Lab Results  Component Value Date   HGB 10.8 (L) 11/02/2024   TIBC 286 11/02/2024   IRONPCTSAT 18 11/02/2024   FERRITIN 93 11/02/2024    Hemoglobin has is slightly improved. She has normal iron  panel, normal B12 and folate. Normal LDH, TSH haptoglobin. No M protein.  Hemoglobinopathy evaluation was negative.  Will check alpha thalassemia genotype. Given the stable hemoglobin, shared decision was made to continue observation and repeat levels in 4 months

## 2024-11-15 LAB — ALPHA-THALASSEMIA ANALYSIS: IMAGE: 0

## 2024-12-15 ENCOUNTER — Other Ambulatory Visit: Payer: Self-pay | Admitting: Family Medicine

## 2024-12-15 DIAGNOSIS — K219 Gastro-esophageal reflux disease without esophagitis: Secondary | ICD-10-CM

## 2024-12-16 ENCOUNTER — Other Ambulatory Visit: Payer: Self-pay | Admitting: Family Medicine

## 2024-12-17 ENCOUNTER — Ambulatory Visit: Admitting: Family Medicine

## 2024-12-17 ENCOUNTER — Encounter: Payer: Self-pay | Admitting: Family Medicine

## 2024-12-17 VITALS — BP 90/60 | HR 66 | Temp 98.2°F | Ht 66.0 in | Wt 174.4 lb

## 2024-12-17 DIAGNOSIS — Z7985 Long-term (current) use of injectable non-insulin antidiabetic drugs: Secondary | ICD-10-CM

## 2024-12-17 DIAGNOSIS — I10 Essential (primary) hypertension: Secondary | ICD-10-CM

## 2024-12-17 DIAGNOSIS — E66812 Obesity, class 2: Secondary | ICD-10-CM | POA: Diagnosis not present

## 2024-12-17 DIAGNOSIS — F419 Anxiety disorder, unspecified: Secondary | ICD-10-CM | POA: Diagnosis not present

## 2024-12-17 DIAGNOSIS — Z6835 Body mass index (BMI) 35.0-35.9, adult: Secondary | ICD-10-CM

## 2024-12-17 DIAGNOSIS — E118 Type 2 diabetes mellitus with unspecified complications: Secondary | ICD-10-CM | POA: Diagnosis not present

## 2024-12-17 DIAGNOSIS — D649 Anemia, unspecified: Secondary | ICD-10-CM

## 2024-12-17 MED ORDER — HYDROXYZINE HCL 25 MG PO TABS: 25.0000 mg | ORAL_TABLET | Freq: Every evening | ORAL | 1 refills | Status: AC | PRN

## 2024-12-17 MED ORDER — HYDROCHLOROTHIAZIDE 12.5 MG PO TABS: 12.5000 mg | ORAL_TABLET | Freq: Every day | ORAL | 3 refills | Status: AC

## 2024-12-17 NOTE — Progress Notes (Signed)
 "  Established Patient Office Visit   Subjective  Patient ID: Brenda Acosta, female    DOB: May 14, 1976  Age: 49 y.o. MRN: [id number]  Chief Complaint  Patient presents with   Medical Management of Chronic Issues    Patient Came in today for a follow-up hypoglycemia, hypotension,     Patient is a 49 year old female seen for follow-up on chronic conditions.  Patient endorses noticing increased anxiety with work and family in the last few months.  Was affecting sleep.  In the past used hydroxyzine  very rarely.  Inquires about refill.  BP typically around 90/60 mmHg, and sometimes even lower. Not drinking enough water throughout the day. coffee in the morning but often does not consume other fluids until later in the day.  History of DM2 and obesity on Mounjaro .  Status post gastric surgery.  Monitors her blood sugar and blood pressure regularly. Gained approximately 6-7 pounds over the holidays, increasing her weight to 168 pounds.  Usually between 160-165 pounds. She has started walking two miles a day, five days a week, to help manage her weight. Trying to drink more water and eat more protein, including fish and poultry, while avoiding starchy foods.  Considering skin removal surgery due to excess skin following significant weight loss of 170 pounds. She experiences skin issues such as irritation and chafing, particularly under her stomach and arms, due to the excess skin.  Patient had recent follow-up with heme-onc for history of normocytic anemia.  Plans to recheck labs in a few months.  For continued anemia considering bone marrow biopsy.    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   IUD (intrauterine device) in place 11/15/2021   Vitamin B12 deficiency 11/15/2021   Depression, major, single episode, moderate (HCC) 06/25/2021    Anxiety 06/25/2021   Thrombocytosis 11/12/2020   BMI 28.0-28.9,adult 08/12/2018   Status post bariatric surgery 04/08/2018   Vitamin D  deficiency 06/24/2017   Steatosis of liver 06/03/2017   Dyspnea on exertion    Chest pain    Noncompliance    Morbid obesity (HCC)    Hyperlipidemia    Diabetes mellitus without complication (HCC)    Chest pain on exertion 08/29/2015   Bilateral leg edema 08/29/2015   Type 2 diabetes mellitus with ophthalmic complication (HCC) 06/10/2013   Essential hypertension 06/10/2013   Other and unspecified hyperlipidemia 06/10/2013   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 09/2015 Ex MV: EF >55%, no ischemia/infarct.   Diabetes mellitus without complication (HCC) 2001   a. 11/20/2015 A1c 11.5;  b.  noncompliant w/ diabetes meds, glucose checks, f/u, etc.   Dyspnea on exertion    a. 09/2015 Echo: Ef 60-65%, no rwma, mild AI, mildly dil LA.   Essential hypertension    Hyperlipidemia    Morbid obesity (HCC)    Mucinous cystadenoma of ovary    Hx of    Noncompliance    Past Surgical History:  Procedure Laterality Date   ABDOMINAL HYSTERECTOMY  August 2007   partial hyst   coloscopy     LAPAROSCOPIC GASTRIC SLEEVE RESECTION  01/08/2018   MOUTH SURGERY     OOPHORECTOMY  07/02/2006   Rt;  large benign ovarian tumor   Social History[1] Family History  Problem Relation Age of Onset   Heart disease Mother 67       heart attack   Diabetes Mother    Early death Mother    Hypertension Father    Diabetes Father        Type II   Cancer Maternal Grandfather        prostate cancer   Diabetes Maternal Grandfather    Early death Sister    Colon cancer Neg Hx    Rectal cancer Neg Hx    Stomach cancer Neg Hx    Allergies[2]  ROS Negative unless stated above    Objective:     BP 90/60  (BP Location: Left Arm, Patient Position: Sitting, Cuff Size: Normal)   Pulse 66   Temp 98.2 F (36.8 C) (Oral)   Ht 5' 6 (1.676 m)   Wt 174 lb 6.4 oz (79.1 kg)   LMP  (LMP Unknown)   BMI 28.15 kg/m  BP Readings from Last 3 Encounters:  12/17/24 90/60  06/21/24 90/60  01/14/24 98/68   Wt Readings from Last 3 Encounters:  12/17/24 174 lb 6.4 oz (79.1 kg)  06/21/24 166 lb 3.2 oz (75.4 kg)  01/14/24 180 lb 6.4 oz (81.8 kg)      Physical Exam Constitutional:      General: She is not in acute distress.    Appearance: Normal appearance.  HENT:     Head: Normocephalic and atraumatic.     Nose: Nose normal.     Mouth/Throat:     Mouth: Mucous membranes are moist.  Cardiovascular:     Rate and Rhythm: Normal rate and regular rhythm.     Heart sounds: Normal heart sounds. No murmur heard.    No gallop.  Pulmonary:     Effort: Pulmonary effort is normal. No respiratory distress.     Breath sounds: Normal breath sounds. No wheezing, rhonchi or rales.  Musculoskeletal:     Right lower leg: No edema.     Left lower leg: No edema.  Skin:    General: Skin is warm and dry.  Neurological:     Mental Status: She is alert and oriented to person, place, and time.        12/17/2024   10:48 AM 08/03/2024    2:23 PM 01/14/2024    4:49 PM  Depression screen PHQ 2/9  Decreased Interest 0 0 0  Down, Depressed, Hopeless 1 0 0  PHQ - 2 Score 1 0 0  Altered sleeping 1  1  Tired, decreased energy 1  1  Change in appetite 1  1  Feeling bad or failure about yourself  0  0  Trouble concentrating 0  0  Moving slowly or fidgety/restless 0  0  Suicidal thoughts 0  0  PHQ-9 Score 4  3   Difficult doing work/chores Somewhat difficult  Not difficult at all     Data saved with a previous flowsheet row definition      12/17/2024   10:48 AM 01/14/2024    4:49 PM 06/19/2023    8:36 AM 06/25/2021    4:09 PM  GAD 7 : Generalized Anxiety Score  Nervous, Anxious, on Edge 1 1 1 3   Control/stop  worrying 1 1 0 3  Worry too much - different things 1 1 1 3   Trouble relaxing 1 1 1 2   Restless 0 0 0 0  Easily annoyed or irritable  0 0 0 0  Afraid - awful might happen 0 1 0 1  Total GAD 7 Score 4 5 3 12   Anxiety Difficulty Not difficult at all Not difficult at all Not difficult at all Not difficult at all     No results found for any visits on 12/17/24.    Assessment & Plan:   Controlled type 2 diabetes mellitus with complication, without long-term current use of insulin (HCC)  Essential hypertension -     hydroCHLOROthiazide ; Take 1 tablet (12.5 mg total) by mouth daily.  Dispense: 90 tablet; Refill: 3  Class 2 severe obesity with serious comorbidity and body mass index (BMI) of 35.0 to 35.9 in adult, unspecified obesity type  Anxiety -     hydrOXYzine  HCl; Take 1 tablet (25 mg total) by mouth at bedtime as needed for anxiety.  Dispense: 90 tablet; Refill: 1  Normocytic anemia  DM2 well-controlled.  Hemoglobin A1c 6.0% on 06/21/2024.  Continue Mounjaro  12.5 mg weekly for DM and obesity.  Current BMI 28.25 kg/m.  Rx now being sent to [facility] due to insurance preference.  Other medication is may remain at current pharmacy.  Continue lifestyle modifications.  Patient encouraged to increase intake of fluids and protein.  BP well-controlled.  Concerns of to take control causing hypotension.  Will discontinue lisinopril -hydrochlorothiazide  10-12.5 mg tab.  Start HCTZ 12.5 mg daily.  Continue monitoring BP.  Further adjustments if needed.  Subjective increase anxiety.  GAD-7 score for this visit and PHQ-9 score 4.  Hydroxyzine  25 mg nightly as needed for anxiety refilled.  Self-care encouraged.  Continue follow-up with heme-onc for anemia.  Recent labs reviewed.  Return in about 3 months (around 03/17/2025) for chronic conditions.   Clotilda JONELLE Single, MD      [1]  Social History Tobacco Use   Smoking status: Never   Smokeless tobacco: Never  Vaping Use   Vaping status:  Never Used  Substance Use Topics   Alcohol use: No   Drug use: No  [2]  Allergies Allergen Reactions   Benzoyl Peroxide Other (See Comments)    [place]urns    "

## 2025-03-08 ENCOUNTER — Inpatient Hospital Stay

## 2025-03-15 ENCOUNTER — Inpatient Hospital Stay: Admitting: Oncology
